# Patient Record
Sex: Male | Born: 1960 | Race: White | Hispanic: No | Marital: Married | Smoking: Never smoker
Health system: Southern US, Community
[De-identification: ages and names within clinical notes are randomized; demographics above are authoritative.]

## PROBLEM LIST (undated history)

## (undated) DIAGNOSIS — E669 Obesity, unspecified: Secondary | ICD-10-CM

## (undated) DIAGNOSIS — E119 Type 2 diabetes mellitus without complications: Secondary | ICD-10-CM

## (undated) DIAGNOSIS — M2550 Pain in unspecified joint: Secondary | ICD-10-CM

## (undated) DIAGNOSIS — I209 Angina pectoris, unspecified: Secondary | ICD-10-CM

## (undated) DIAGNOSIS — J189 Pneumonia, unspecified organism: Secondary | ICD-10-CM

## (undated) DIAGNOSIS — I10 Essential (primary) hypertension: Secondary | ICD-10-CM

## (undated) DIAGNOSIS — I2510 Atherosclerotic heart disease of native coronary artery without angina pectoris: Secondary | ICD-10-CM

## (undated) DIAGNOSIS — E785 Hyperlipidemia, unspecified: Secondary | ICD-10-CM

## (undated) DIAGNOSIS — Z8701 Personal history of pneumonia (recurrent): Secondary | ICD-10-CM

## (undated) DIAGNOSIS — E7800 Pure hypercholesterolemia, unspecified: Secondary | ICD-10-CM

## (undated) DIAGNOSIS — I639 Cerebral infarction, unspecified: Secondary | ICD-10-CM

## (undated) DIAGNOSIS — K429 Umbilical hernia without obstruction or gangrene: Secondary | ICD-10-CM

## (undated) DIAGNOSIS — E041 Nontoxic single thyroid nodule: Secondary | ICD-10-CM

## (undated) HISTORY — DX: Cerebral infarction, unspecified: I63.9

## (undated) HISTORY — PX: CORONARY ANGIOPLASTY: SHX604

## (undated) HISTORY — DX: Pain in unspecified joint: M25.50

## (undated) HISTORY — DX: Hyperlipidemia, unspecified: E78.5

## (undated) HISTORY — PX: CARDIAC CATHETERIZATION: SHX172

## (undated) HISTORY — DX: Personal history of pneumonia (recurrent): Z87.01

## (undated) HISTORY — PX: COLONOSCOPY: SHX174

## (undated) HISTORY — DX: Essential (primary) hypertension: I10

## (undated) HISTORY — DX: Umbilical hernia without obstruction or gangrene: K42.9

## (undated) HISTORY — DX: Pure hypercholesterolemia, unspecified: E78.00

## (undated) HISTORY — PX: US CAROTID DOPPLER BILATERAL (ARMC HX): HXRAD1402

## (undated) HISTORY — DX: Type 2 diabetes mellitus without complications: E11.9

## (undated) HISTORY — DX: Angina pectoris, unspecified: I20.9

## (undated) HISTORY — PX: FOOT SURGERY: SHX648

## (undated) HISTORY — PX: CARDIOVASCULAR STRESS TEST: SHX262

## (undated) HISTORY — PX: OTHER SURGICAL HISTORY: SHX169

## (undated) HISTORY — PX: TRANSTHORACIC ECHOCARDIOGRAM: SHX275

## (undated) HISTORY — DX: Obesity, unspecified: E66.9

## (undated) HISTORY — PX: APPENDECTOMY: SHX54

---

## 2002-02-12 ENCOUNTER — Encounter: Admission: RE | Admit: 2002-02-12 | Discharge: 2002-04-19 | Payer: Self-pay | Admitting: Orthopedic Surgery

## 2003-04-04 ENCOUNTER — Ambulatory Visit (HOSPITAL_BASED_OUTPATIENT_CLINIC_OR_DEPARTMENT_OTHER): Admission: RE | Admit: 2003-04-04 | Discharge: 2003-04-04 | Payer: Self-pay | Admitting: Orthopedic Surgery

## 2003-04-16 ENCOUNTER — Encounter: Admission: RE | Admit: 2003-04-16 | Discharge: 2003-07-15 | Payer: Self-pay | Admitting: Orthopedic Surgery

## 2008-10-21 ENCOUNTER — Ambulatory Visit (HOSPITAL_COMMUNITY): Admission: RE | Admit: 2008-10-21 | Discharge: 2008-10-21 | Payer: Self-pay | Admitting: Interventional Cardiology

## 2008-10-28 ENCOUNTER — Inpatient Hospital Stay (HOSPITAL_COMMUNITY): Admission: RE | Admit: 2008-10-28 | Discharge: 2008-10-29 | Payer: Self-pay | Admitting: Interventional Cardiology

## 2008-11-13 ENCOUNTER — Encounter (HOSPITAL_COMMUNITY): Admission: RE | Admit: 2008-11-13 | Discharge: 2009-02-11 | Payer: Self-pay | Admitting: Interventional Cardiology

## 2009-02-12 ENCOUNTER — Encounter (HOSPITAL_COMMUNITY): Admission: RE | Admit: 2009-02-12 | Discharge: 2009-03-16 | Payer: Self-pay | Admitting: Interventional Cardiology

## 2011-02-09 LAB — GLUCOSE, CAPILLARY: Glucose-Capillary: 99 mg/dL (ref 70–99)

## 2011-02-10 LAB — GLUCOSE, CAPILLARY: Glucose-Capillary: 94 mg/dL (ref 70–99)

## 2011-02-14 LAB — GLUCOSE, CAPILLARY
Glucose-Capillary: 104 mg/dL — ABNORMAL HIGH (ref 70–99)
Glucose-Capillary: 194 mg/dL — ABNORMAL HIGH (ref 70–99)
Glucose-Capillary: 87 mg/dL (ref 70–99)

## 2011-02-15 LAB — GLUCOSE, CAPILLARY
Glucose-Capillary: 101 mg/dL — ABNORMAL HIGH (ref 70–99)
Glucose-Capillary: 115 mg/dL — ABNORMAL HIGH (ref 70–99)
Glucose-Capillary: 127 mg/dL — ABNORMAL HIGH (ref 70–99)
Glucose-Capillary: 129 mg/dL — ABNORMAL HIGH (ref 70–99)
Glucose-Capillary: 207 mg/dL — ABNORMAL HIGH (ref 70–99)
Glucose-Capillary: 109 mg/dL — ABNORMAL HIGH (ref 70–99)
Glucose-Capillary: 116 mg/dL — ABNORMAL HIGH (ref 70–99)
Glucose-Capillary: 136 mg/dL — ABNORMAL HIGH (ref 70–99)

## 2011-03-18 NOTE — Op Note (Signed)
NAME:  Eddie Garner, Eddie Garner                         ACCOUNT NO.:  [id number]   MEDICAL RECORD NO.:  [id number]                   PATIENT TYPE:  AMB   LOCATION:  DSC                                  FACILITY:  [facility]   PHYSICIAN:  Almedia Balls. Ranell Patrick, M.D.              DATE OF BIRTH:  [birth year]   DATE OF PROCEDURE:  04/04/2003  DATE OF DISCHARGE:                                 OPERATIVE REPORT   PREOPERATIVE DIAGNOSIS:  Right knee chondromalacia.   POSTOPERATIVE DIAGNOSES:  1. Right knee chondromalacia patellofemoral joint, grade 3.  2. Right knee medial femoral chondral fracture measuring 1 cm x 1.5 cm.  3. Hypertrophic synovium.   PROCEDURE PERFORMED:  Right knee arthroscopy with debridement of  hypertrophic synovium and abrasion chondroplasty medial femoral condyle.   ATTENDING SURGEON:  Almedia Balls. Ranell Patrick, M.D.   FIRST ASSISTANT:  None.   ANESTHESIA:  Local plus MAC was used.   ESTIMATED BLOOD LOSS:  Minimal.   FLUID REPLACEMENT:  800 mL crystalloid.   INSTRUMENT COUNTS:  Correct.   COMPLICATIONS:  None.   ANTIBIOTICS:  Preoperative antibiotics were given.   INDICATIONS:  The patient is a 50 year old male, who injured his right knee  on the job.  The patient has had a long period of nonoperative treatment  consisting of activity modification, anti-inflammatory medications, and  injections.  The patient now presents for persistent medial-sided pain with  concerns over chondromalacia and possible patella maltracking for  arthroscopic surgery.  Informed consent was obtained.   DESCRIPTION OF OPERATION:  After an adequate level of anesthesia was  achieved, the patient was positioned supine on the operating room table.  A  nonsterile tourniquet was placed on the right proximal leg.  The right leg  is prepped and draped in its entirety in the usual sterile fashion.  Diagnostic knee arthroscopy was performed through standard arthroscopic  portals.  Superolateral outflow,  anterior lateral scope, and anteromedial  working portals were all created in a similar fashion with infiltration of  the skin with 0.5% Marcaine with epinephrine followed by incision with 11  blade scalpel and introduction of cannula in the joint using blunt  obturators.  Diagnostic arthroscopy revealed significant hypertrophic  synovium present in the anterior aspect of the joint.  This was all cleaned  out revealing a notch with a normal ACL and PCL, and there was noted to be  no evidence of medial meniscus tear.  The medial femoral condyle was noted  to have a fairly large chondral defect, measuring 1.5 x 1 cm with a loose  flap near the notch.  The lateral femoral condyle was normal.  Lateral tibia  condyle had some grade 3 chondral changes with some fraying and fibrillation  softening.  The lateral meniscus was normal as well.  There were no loose  bodies noted.  The patellofemoral joint was entered.  There was noted to be  grade 3 changes in the trochlea, both deep and mild grade 2 changes  proximally in the trochlear groove.  On the patella, there was noted to be  some central patellar chondromalacia indicative of wear; however, there was  no excessive lateral patella chondromalacia and certainly no _________ in  the lateral femoral condyle to suggest maltracking, and there was no  patellar tilt noted.  At this point, after debridement of the hypertrophic  synovium, a decision was made to try to smooth out the loose flap of  chondral surface on the medial femoral condyle.  This was done utilizing a  full radius resector.  I was very pleased with how this removed the slight  step-off noted at the medial femoral condyle, and hopefully this will  alleviate his pain.  We concluded the surgery with a thorough irrigation,  inspection for any loose pieces of cartilage.  None were noted.  ACL and PCL  again were noted to be intact.  The scope was concluded.  The wound was  sutured using 4-0  Monocryl and Steri-Strips applied.  The patient tolerated  the surgery well and was taken to the recovery room in stable condition.                                               Almedia Balls. Ranell Patrick, M.D.    SRN/MEDQ  D:  04/04/2003  T:  04/06/2003  Job:  [id number]

## 2011-08-05 LAB — BASIC METABOLIC PANEL
BUN: 13 mg/dL (ref 6–23)
BUN: 14 mg/dL (ref 6–23)
CO2: 26 mEq/L (ref 19–32)
Chloride: 106 mEq/L (ref 96–112)
Creatinine, Ser: 0.82 mg/dL (ref 0.4–1.5)
GFR calc Af Amer: >60 mL/min (ref >60)
GFR calc non Af Amer: >60 mL/min (ref >60)
Glucose, Bld: 114 mg/dL — ABNORMAL HIGH (ref 70–99)
Potassium: 4 mEq/L (ref 3.5–5.1)

## 2011-08-05 LAB — CBC
HCT: 41.2 % (ref 39.0–52.0)
MCHC: 33.9 g/dL (ref 30.0–36.0)
MCV: 93.2 fL (ref 78.0–100.0)
Platelets: 217 10*3/uL (ref 150–400)
Platelets: 224 10*3/uL (ref 150–400)
RBC: 4.82 MIL/uL (ref 4.22–5.81)
RDW: 12.4 % (ref 11.5–15.5)
WBC: 6.6 10*3/uL (ref 4.0–10.5)
WBC: 7.2 10*3/uL (ref 4.0–10.5)

## 2011-08-05 LAB — APTT: aPTT: 31 seconds (ref 24–37)

## 2011-08-05 LAB — PROTIME-INR
INR: 1 (ref 0.00–1.49)
Prothrombin Time: 13.5 seconds (ref 11.6–15.2)

## 2011-08-05 LAB — GLUCOSE, CAPILLARY: Glucose-Capillary: 120 mg/dL — ABNORMAL HIGH (ref 70–99)

## 2013-07-31 ENCOUNTER — Other Ambulatory Visit: Payer: Self-pay | Admitting: Interventional Cardiology

## 2013-08-01 ENCOUNTER — Other Ambulatory Visit: Payer: Self-pay | Admitting: Cardiology

## 2013-08-01 MED ORDER — SIMVASTATIN 20 MG PO TABS: 20.0000 mg | ORAL_TABLET | Freq: Every day | ORAL | Status: DC

## 2013-09-02 ENCOUNTER — Telehealth: Payer: Self-pay | Admitting: Interventional Cardiology

## 2013-09-02 NOTE — Telephone Encounter (Signed)
New problem   Pt stated he needed a stent card. Please call pt concerning this matter.

## 2013-09-03 NOTE — Telephone Encounter (Signed)
Spoke with pt and let him know to call the cath lab to obtain a copy of stent card.

## 2013-09-17 ENCOUNTER — Other Ambulatory Visit: Payer: Self-pay | Admitting: Pharmacist

## 2013-09-17 MED ORDER — SIMVASTATIN 20 MG PO TABS: 20.0000 mg | ORAL_TABLET | Freq: Every day | ORAL | Status: DC

## 2013-09-21 ENCOUNTER — Encounter: Payer: Self-pay | Admitting: Interventional Cardiology

## 2013-09-21 ENCOUNTER — Encounter: Payer: Self-pay | Admitting: *Deleted

## 2013-09-23 ENCOUNTER — Ambulatory Visit: Payer: Self-pay | Admitting: Interventional Cardiology

## 2013-09-27 ENCOUNTER — Encounter: Payer: Self-pay | Admitting: Interventional Cardiology

## 2014-01-02 ENCOUNTER — Telehealth: Payer: Self-pay | Admitting: Interventional Cardiology

## 2014-01-02 NOTE — Telephone Encounter (Signed)
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  pls call pt when note is ready

## 2014-01-02 NOTE — Telephone Encounter (Signed)
To Dr. Irish Lack, if you type what you would like letter to say, I copy/paste and make a letter for you to sign.

## 2014-01-03 ENCOUNTER — Encounter: Payer: Self-pay | Admitting: Cardiology

## 2014-01-03 NOTE — Telephone Encounter (Signed)
This letter is in regards to Eddie Garner, DOB [birth year].  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 He has not reported any cardiovascular symptoms in several years.  Sincerely,    Casandra Doffing, MD

## 2014-01-03 NOTE — Telephone Encounter (Signed)
Letter printed, awaiting signature.  

## 2014-01-06 NOTE — Telephone Encounter (Signed)
Letter signed, pt aware and he will pick up today

## 2014-04-22 ENCOUNTER — Encounter: Payer: Self-pay | Admitting: *Deleted

## 2014-07-11 ENCOUNTER — Other Ambulatory Visit: Payer: Self-pay

## 2014-07-11 MED ORDER — SIMVASTATIN 20 MG PO TABS: 20.0000 mg | ORAL_TABLET | Freq: Every day | ORAL | Status: DC

## 2014-07-11 MED ORDER — CLOPIDOGREL BISULFATE 75 MG PO TABS: 75.0000 mg | ORAL_TABLET | Freq: Every day | ORAL | Status: DC

## 2014-07-22 ENCOUNTER — Ambulatory Visit: Payer: Self-pay | Admitting: Interventional Cardiology

## 2014-08-15 ENCOUNTER — Other Ambulatory Visit: Payer: Self-pay | Admitting: Interventional Cardiology

## 2014-08-21 NOTE — Telephone Encounter (Signed)
I need to see him before refilling meds.

## 2014-08-22 ENCOUNTER — Other Ambulatory Visit: Payer: Self-pay

## 2014-09-03 ENCOUNTER — Telehealth: Payer: Self-pay | Admitting: Interventional Cardiology

## 2014-09-03 NOTE — Telephone Encounter (Signed)
Received request from Nurse fax box, documents faxed for surgical clearance. To: [facility] Latino  Fax number: [phone] Attention: 11.3.2015/L.Owens Shark

## 2014-11-10 ENCOUNTER — Encounter: Payer: Self-pay | Admitting: Interventional Cardiology

## 2014-11-10 ENCOUNTER — Encounter: Payer: Self-pay | Admitting: *Deleted

## 2014-11-10 ENCOUNTER — Ambulatory Visit (INDEPENDENT_AMBULATORY_CARE_PROVIDER_SITE_OTHER): Payer: 59 | Admitting: Interventional Cardiology

## 2014-11-10 VITALS — BP 122/88 | HR 66 | Ht 71.0 in | Wt 241.0 lb

## 2014-11-10 DIAGNOSIS — E782 Mixed hyperlipidemia: Secondary | ICD-10-CM

## 2014-11-10 DIAGNOSIS — I10 Essential (primary) hypertension: Secondary | ICD-10-CM

## 2014-11-10 DIAGNOSIS — I2510 Atherosclerotic heart disease of native coronary artery without angina pectoris: Secondary | ICD-10-CM

## 2014-11-10 NOTE — Patient Instructions (Signed)
Your physician has recommended you make the following change in your medication:  1) STOP taking Aspirin  Your physician wants you to follow-up in: 1 year with Dr. Varanasi. You will receive a reminder letter in the mail two months in advance. If you don't receive a letter, please call our office to schedule the follow-up appointment.   

## 2014-11-10 NOTE — Progress Notes (Signed)
Patient ID: Eddie Garner, male   DOB: [birth year], 54 y.o.   MRN: [id number]    [address], [address] [place], [place]  [address] Phone: [phone] Fax:  [phone]  Date:  11/10/2014   ID:  Eddie Garner, DOB Sep 26, 1961, MRN [id number]  PCP:  Abigail Miyamoto, MD      History of Present Illness: Eddie Garner is a 54 y.o. male who has had CAD and a DES to the LAD in 2009.  He has done well since that time.  He was in a study for his cholesterol.  He feels that his appetite is increased since stopping the study.   he denies any chest discomfort , dizziness, leg swelling , nitroglycerin use , irregular heartbeats , lightheadedness or passing out. He has a very physically strenuous job. He has not had any anginal symptoms at work. He thinks he has gained a little bit of weight.   He was diagnosed with diabetes. This is managed with oral medications.   Wt Readings from Last 3 Encounters:  11/10/14 241 lb (109.317 kg)     Past Medical History  Diagnosis Date  . Pain in joint   . Obesity   . HTN (hypertension)   . Angina pectoris   . Hyperlipidemia   . Hypercholesteremia   . Diabetes     Current Outpatient Prescriptions  Medication Sig Dispense Refill  . aspirin 81 MG tablet Take 81 mg by mouth daily.    . clopidogrel (PLAVIX) 75 MG tablet Take 1 tablet (75 mg total) by mouth daily with breakfast. 30 tablet 0  . glucose blood test strip 1 each by Other route as needed for other. Use as instructed    . INVOKANA 100 MG TABS tablet Take 100 mg by mouth daily.  1  . lisinopril (PRINIVIL,ZESTRIL) 10 MG tablet Take 10 mg by mouth daily.    . metFORMIN (GLUCOPHAGE-XR) 750 MG 24 hr tablet Take 750 mg by mouth daily with breakfast.    . Multiple Vitamin (MULTIVITAMIN) capsule Take 1 capsule by mouth daily.    . Omega-3 Fatty Acids (FISH OIL) 1000 MG CAPS Take 1 capsule by mouth daily.    . simvastatin (ZOCOR) 20 MG tablet Take 1 tablet (20 mg total) by mouth at bedtime. 30  tablet 0  . nitroGLYCERIN (NITROSTAT) 0.4 MG SL tablet Place 0.4 mg under the tongue every 5 (five) minutes as needed for chest pain (every 5 mins up to three times).     No current facility-administered medications for this visit.    Allergies:   No Known Allergies  Social History:  The patient  reports that he has never smoked. He does not have any smokeless tobacco history on file. He reports that he drinks alcohol.   Family History:  The patient's family history includes Atrial fibrillation in his father; CVA in his paternal grandfather; Coronary artery disease in his maternal grandfather; Diabetes in his paternal grandmother; Stroke in his paternal grandfather.   ROS:  Please see the history of present illness.  No nausea, vomiting.  No fevers, chills.  No focal weakness.  No dysuria.   All other systems reviewed and negative.   PHYSICAL EXAM: VS:  BP 122/88 mmHg  Pulse 66  Ht 5\' 11"  (1.803 m)  Wt 241 lb (109.317 kg)  BMI 33.63 kg/m2 General: Well developed, well nourished, in no acute distress HEENT: normal Neck: no JVD, no carotid bruits Cardiac:  normal S1,  S2; RRR;  Lungs:  clear to auscultation bilaterally, no wheezing, rhonchi or rales Abd: soft, nontender, no hepatomegaly Ext: no edema Skin: warm and dry Neuro:   no focal abnormalities noted Psych: normal affect  EKG:  * normal sinus rhythm , inferior T wave inversions, no significant ST segment changes    ASSESSMENT AND PLAN:  1.  coronary artery disease: continue Plavix 75 mg daily. Stop aspirin since it has been several years since his stent placement. Continue lipid-lowering therapy with LDL target less than 100. 2.  Hypertension: blood pressure well controlled today.   Continue lisinopril. Check blood pressure occasionally at home if possible. If he has readings consistently over 130/80, then would increase lisinopril. ACE inhibitor is a reasonable choice since he is now diabetic. 3.  Mixed hyperlipidemia: lipids  have been controlled on current regimen. Will obtain most recent blood work from primary care doctor. 4.  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 5.  I encouraged him to try to get 150 minutes of aerobic exercise every week.   Diabetes managed by primary care.  Signed, Mina Marble, MD, [facility] 11/10/2014 8:59 AM

## 2014-11-21 ENCOUNTER — Other Ambulatory Visit: Payer: Self-pay | Admitting: Interventional Cardiology

## 2014-12-02 ENCOUNTER — Ambulatory Visit: Payer: Self-pay | Admitting: Interventional Cardiology

## 2014-12-23 ENCOUNTER — Other Ambulatory Visit: Payer: Self-pay | Admitting: Interventional Cardiology

## 2014-12-24 ENCOUNTER — Other Ambulatory Visit: Payer: Self-pay

## 2014-12-24 MED ORDER — SIMVASTATIN 20 MG PO TABS: ORAL_TABLET | ORAL | Status: DC

## 2014-12-24 MED ORDER — CLOPIDOGREL BISULFATE 75 MG PO TABS: ORAL_TABLET | ORAL | Status: DC

## 2015-01-16 ENCOUNTER — Other Ambulatory Visit: Payer: Self-pay | Admitting: *Deleted

## 2015-01-16 MED ORDER — CLOPIDOGREL BISULFATE 75 MG PO TABS: ORAL_TABLET | ORAL | Status: DC

## 2015-01-28 ENCOUNTER — Other Ambulatory Visit: Payer: Self-pay | Admitting: Interventional Cardiology

## 2015-02-21 ENCOUNTER — Other Ambulatory Visit: Payer: Self-pay | Admitting: Interventional Cardiology

## 2015-07-29 ENCOUNTER — Ambulatory Visit: Payer: 59 | Admitting: Nurse Practitioner

## 2015-11-13 ENCOUNTER — Telehealth: Payer: Self-pay | Admitting: Interventional Cardiology

## 2015-11-13 NOTE — Telephone Encounter (Signed)
Walk in today to make his appointment to see Dr. Irish Lack on 12-07-15.  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

## 2015-11-13 NOTE — Telephone Encounter (Signed)
The pt states that he is not sure what type of stress tests he needs and wants to wait until appt with Dr Irish Lack on 2/6 to discuss as he states that he wants to have the one that is best for him. 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  Will forward to Dr Irish Lack as Juluis Rainier.

## 2015-11-13 NOTE — Telephone Encounter (Signed)
LMTCB

## 2015-12-07 ENCOUNTER — Encounter: Payer: Self-pay | Admitting: Interventional Cardiology

## 2015-12-07 ENCOUNTER — Ambulatory Visit (INDEPENDENT_AMBULATORY_CARE_PROVIDER_SITE_OTHER): Payer: 59 | Admitting: Interventional Cardiology

## 2015-12-07 VITALS — BP 136/80 | HR 83 | Ht 70.5 in | Wt 238.8 lb

## 2015-12-07 DIAGNOSIS — I10 Essential (primary) hypertension: Secondary | ICD-10-CM | POA: Diagnosis not present

## 2015-12-07 DIAGNOSIS — I2510 Atherosclerotic heart disease of native coronary artery without angina pectoris: Secondary | ICD-10-CM | POA: Diagnosis not present

## 2015-12-07 DIAGNOSIS — E782 Mixed hyperlipidemia: Secondary | ICD-10-CM | POA: Diagnosis not present

## 2015-12-07 NOTE — Patient Instructions (Addendum)
Medication Instructions:  Your physician recommends that you continue on your current medications as directed. Please refer to the Current Medication list given to you today.   Labwork: NONE ORDERED  Testing/Procedures: Your physician has requested that you have en exercise stress myoview. For further information please visit [email or web address]. Please follow instruction sheet, as given.    Follow-Up: Your physician wants you to follow-up in: [place] DR. VARANASI.  You will receive a reminder letter in the mail two months in advance. If you don't receive a letter, please call our office to schedule the follow-up appointment.   Any Other Special Instructions Will Be Listed Below (If Applicable).    If you need a refill on your cardiac medications before your next appointment, please call your pharmacy.

## 2015-12-07 NOTE — Progress Notes (Signed)
Patient ID: Eddie Garner, male   DOB: 04-Feb-1961, 55 y.o.   MRN: [id number]     Cardiology Office Note   Date:  12/07/2015   ID:  Eddie Garner, DOB [birth year], MRN [id number]  PCP:  Abigail Miyamoto, MD    No chief complaint on file. f/u CAD   Wt Readings from Last 3 Encounters:  12/07/15 238 lb 12.8 oz (108.319 kg)  11/10/14 241 lb (109.317 kg)       History of Present Illness: Eddie Garner is a 55 y.o. male  Who has had an LAD stent.  He has had some chest pain after eating.  It is not related to exertion.  He uses 3-4 x /week.  He has not used NTG.  It is very different from what he had before his stent.   Pain is better when he does not eat.   Now on Invokana for DM.  His blood sugars were a little higher after the holiday.  He was busy at work and his eating schedule was off.     ETT in 2014 was negative.  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    Past Medical History  Diagnosis Date  . Pain in joint   . Obesity   . HTN (hypertension)   . Angina pectoris ([place])   . Hyperlipidemia   . Hypercholesteremia   . Diabetes [facility])     Past Surgical History  Procedure Laterality Date  . Foot surgery    . Appendectomy       Current Outpatient Prescriptions  Medication Sig Dispense Refill  . clopidogrel (PLAVIX) 75 MG tablet TAKE 1 TABLET (75 MG TOTAL) BY MOUTH DAILY WITH BREAKFAST. 90 tablet 3  . dapagliflozin propanediol (FARXIGA) 5 MG TABS tablet Take 1 tablet by mouth daily.    Marland Kitchen glucose blood test strip 1 each by Other route as needed for other. Use as instructed    . INVOKANA 100 MG TABS tablet Take 100 mg by mouth daily.  1  . lisinopril (PRINIVIL,ZESTRIL) 10 MG tablet Take 10 mg by mouth daily.    . metFORMIN (GLUCOPHAGE-XR) 750 MG 24 hr tablet Take 750 mg by mouth daily with breakfast.    . Multiple Vitamin (MULTIVITAMIN) capsule Take 1 capsule by mouth daily.    . nitroGLYCERIN (NITROSTAT) 0.4 MG SL tablet Place 0.4 mg under the tongue every 5 (five)  minutes as needed for chest pain (every 5 mins up to three times).    . Omega-3 Fatty Acids (FISH OIL) 1000 MG CAPS Take 1 capsule by mouth daily.    . simvastatin (ZOCOR) 20 MG tablet TAKE 1 TABLET (20 MG TOTAL) BY MOUTH AT BEDTIME. 30 tablet 11   No current facility-administered medications for this visit.    Allergies:   Review of patient's allergies indicates no known allergies.    Social History:  The patient  reports that he has never smoked. He does not have any smokeless tobacco history on file. He reports that he drinks alcohol.   Family History:  The patient's family history includes Atrial fibrillation in his father; CVA in his paternal grandfather; Coronary artery disease in his maternal grandfather; Diabetes in his paternal grandmother; Stroke in his paternal grandfather.    ROS:  Please see the history of present illness.   Otherwise, review of systems are positive for mild chest pain.   All other systems are reviewed and negative.    PHYSICAL EXAM: VS:  BP 136/80  mmHg  Pulse 83  Ht 5' 10.5" (1.791 m)  Wt 238 lb 12.8 oz (108.319 kg)  BMI 33.77 kg/m2 , BMI Body mass index is 33.77 kg/(m^2). GEN: Well nourished, well developed, in no acute distress HEENT: normal Neck: no JVD, carotid bruits, or masses Cardiac: RRR; no murmurs, rubs, or gallops,no edema  Respiratory:  clear to auscultation bilaterally, normal work of breathing GI: soft, nontender, nondistended, + BS MS: no deformity or atrophy Skin: warm and dry, no rash Neuro:  Strength and sensation are intact Psych: euthymic mood, full affect   EKG:   The ekg ordered today demonstrates Normal sinus rhythm, no St segment changes   Recent Labs: No results found for requested labs within last 365 days.   Lipid Panel No results found for: CHOL, TRIG, HDL, CHOLHDL, VLDL, LDLCALC, LDLDIRECT   Other studies Reviewed: Additional studies/ records that were reviewed today with results demonstrating: stress test in  2014 negative fro ischemia.   ASSESSMENT AND PLAN:  1. CAD with chest discomfort:  spome atypical features but given 56 year old stent, will plan exercise myoview.  Continue aggressive secondary prevention including clopidogrel.  Now off aspirin. 2. DM: Managed by PMD.  COntinue meds. 3. Hyperlipidemia: COntinue simvastatin.   4. HTN: Controlled.  COntinue current meds.   Current medicines are reviewed at length with the patient today.  The patient concerns regarding his medicines were addressed.  The following changes have been made:  No change  Labs/ tests ordered today include: myoview No orders of the defined types were placed in this encounter.    Recommend 150 minutes/week of aerobic exercise Low fat, low carb, high fiber diet recommended  Disposition:   FU in for stress test   Teresita Madura., MD  12/07/2015 11:43 AM    [facility] [address], [place], [place]  [address] Phone: [phone]; Fax: [phone]

## 2015-12-09 ENCOUNTER — Telehealth (HOSPITAL_COMMUNITY): Payer: Self-pay | Admitting: *Deleted

## 2015-12-09 NOTE — Telephone Encounter (Signed)
Left message on voicemail per DPR in reference to upcoming appointment scheduled on 12/15/15 at 715 with detailed instructions given per Myocardial Perfusion Study Information Sheet for the test. LM to arrive 15 minutes early, and that it is imperative to arrive on time for appointment to keep from having the test rescheduled. If you need to cancel or reschedule your appointment, please call the office within 24 hours of your appointment. Failure to do so may result in a cancellation of your appointment, and a $50 no show fee. Phone number given for call back for any questions. Hubbard Robinson, RN

## 2015-12-15 ENCOUNTER — Ambulatory Visit (HOSPITAL_COMMUNITY): Payer: 59 | Attending: Cardiology

## 2015-12-15 DIAGNOSIS — R9439 Abnormal result of other cardiovascular function study: Secondary | ICD-10-CM | POA: Diagnosis not present

## 2015-12-15 DIAGNOSIS — E119 Type 2 diabetes mellitus without complications: Secondary | ICD-10-CM | POA: Diagnosis not present

## 2015-12-15 DIAGNOSIS — I10 Essential (primary) hypertension: Secondary | ICD-10-CM | POA: Diagnosis not present

## 2015-12-15 DIAGNOSIS — I2510 Atherosclerotic heart disease of native coronary artery without angina pectoris: Secondary | ICD-10-CM | POA: Diagnosis present

## 2015-12-15 DIAGNOSIS — R079 Chest pain, unspecified: Secondary | ICD-10-CM | POA: Diagnosis not present

## 2015-12-15 LAB — MYOCARDIAL PERFUSION IMAGING
CHL CUP NUCLEAR SRS: 0
CHL CUP RESTING HR STRESS: 67 {beats}/min
CSEPED: 11 min
CSEPEDS: 15 s
Estimated workload: 13.4 METS
LV dias vol: 120 mL
LVSYSVOL: 56 mL
MPHR: 165 {beats}/min
NUC STRESS TID: 0.92
Peak HR: 157 {beats}/min
Percent HR: 95 %
RATE: 0.33
SDS: 1
SSS: 1

## 2015-12-15 MED ORDER — TECHNETIUM TC 99M SESTAMIBI GENERIC - CARDIOLITE
32.9000 | Freq: Once | INTRAVENOUS | Status: AC | PRN
  Administered 2015-12-15: 32.9 via INTRAVENOUS

## 2015-12-15 MED ORDER — TECHNETIUM TC 99M SESTAMIBI GENERIC - CARDIOLITE
10.8000 | Freq: Once | INTRAVENOUS | Status: AC | PRN
  Administered 2015-12-15: 11 via INTRAVENOUS

## 2015-12-18 ENCOUNTER — Encounter: Payer: Self-pay | Admitting: *Deleted

## 2016-01-03 ENCOUNTER — Other Ambulatory Visit: Payer: Self-pay | Admitting: Interventional Cardiology

## 2016-01-05 NOTE — Telephone Encounter (Signed)
**Note De-identified Seidy Labreck Obfuscation** Ok to fill. Thanks

## 2016-01-05 NOTE — Telephone Encounter (Signed)
No recent lipid panel in epic, but recent office visit dictation says continue simvastatin. Ok to refill?

## 2016-01-16 ENCOUNTER — Other Ambulatory Visit: Payer: Self-pay | Admitting: Interventional Cardiology

## 2016-11-11 ENCOUNTER — Other Ambulatory Visit: Payer: Self-pay | Admitting: *Deleted

## 2016-11-11 MED ORDER — SIMVASTATIN 20 MG PO TABS: ORAL_TABLET | ORAL | 0 refills | Status: DC

## 2016-11-26 ENCOUNTER — Other Ambulatory Visit: Payer: Self-pay | Admitting: Interventional Cardiology

## 2016-11-28 NOTE — Telephone Encounter (Signed)
Medication Detail    Disp Refills Start End   simvastatin (ZOCOR) 20 MG tablet 30 tablet 0 11/11/2016    Sig: TAKE 1 TABLET (20 MG TOTAL) BY MOUTH AT BEDTIME.   E-Prescribing Status: Receipt confirmed by pharmacy (11/11/2016 4:10 PM EST)   Pharmacy   [id number] - [place], [place] - [address]

## 2017-01-05 ENCOUNTER — Ambulatory Visit: Payer: 59 | Admitting: Physician Assistant

## 2017-01-06 ENCOUNTER — Other Ambulatory Visit: Payer: Self-pay | Admitting: Interventional Cardiology

## 2017-01-06 NOTE — Telephone Encounter (Signed)
simvastatin (ZOCOR) 20 MG tablet  Medication  Date: 11/11/2016 Department: [address] Office Ordering/Authorizing: Jettie Booze, MD  Order Providers   Prescribing Provider Encounter Provider  Jettie Booze, MD Roberts Gaudy, CMA  Medication Detail    Disp Refills Start End   simvastatin (ZOCOR) 20 MG tablet 30 tablet 0 11/11/2016    Sig: TAKE 1 TABLET (20 MG TOTAL) BY MOUTH AT BEDTIME.   E-Prescribing Status: Receipt confirmed by pharmacy (11/11/2016 4:10 PM EST)   Pharmacy   [id number] - [place], [place] - [address]

## 2017-01-10 ENCOUNTER — Other Ambulatory Visit: Payer: Self-pay | Admitting: Family Medicine

## 2017-01-10 DIAGNOSIS — I739 Peripheral vascular disease, unspecified: Secondary | ICD-10-CM

## 2017-01-11 ENCOUNTER — Telehealth: Payer: Self-pay

## 2017-01-12 NOTE — Progress Notes (Signed)
Patient ID: Eddie Garner, male   DOB: 1961-03-25, 56 y.o.   MRN: [id number]     Cardiology Office Note   Date:  01/13/2017   ID:  Eddie Garner, DOB [birth year], MRN [id number]  PCP:  Abigail Miyamoto, MD    No chief complaint on file. f/u CAD   Wt Readings from Last 3 Encounters:  01/13/17 235 lb 6.4 oz (106.8 kg)  12/15/15 238 lb (108 kg)  12/07/15 238 lb 12.8 oz (108.3 kg)       History of Present Illness: Eddie Garner is a 56 y.o. male  Who has had an LAD stent in 2010.     ETT in 2014 was negative. 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    Left elbow pain at times- rare.  Has some chronic shoulder pain as well.  It is not related to exertion.  He exercises 3-4 x /week.  He has not used NTG.  It is very different from what he had before his stent.    Now on Invokana for DM.  His blood sugars were a little higher after the holiday.  He was busy at work and his eating schedule was off.  He was in [place] working for 2 months.  Last A1C 9.  Working on getting that down.        Past Medical History:  Diagnosis Date  . Angina pectoris ([place])   . Diabetes ([place])   . HTN (hypertension)   . Hypercholesteremia   . Hyperlipidemia   . Obesity   . Pain in joint     Past Surgical History:  Procedure Laterality Date  . APPENDECTOMY    . FOOT SURGERY       Current Outpatient Prescriptions  Medication Sig Dispense Refill  . clopidogrel (PLAVIX) 75 MG tablet TAKE 1 TABLET (75 MG TOTAL) BY MOUTH DAILY WITH BREAKFAST. 90 tablet 3  . dapagliflozin propanediol (FARXIGA) 5 MG TABS tablet Take 1 tablet by mouth daily.    Marland Kitchen glucose blood test strip 1 each by Other route as needed for other. Use as instructed    . INVOKANA 100 MG TABS tablet Take 100 mg by mouth daily.  1  . JANUVIA 100 MG tablet Take 100 mg by mouth daily.  1  . lisinopril (PRINIVIL,ZESTRIL) 10 MG tablet Take 10 mg by mouth daily.    . metFORMIN (GLUCOPHAGE-XR)  750 MG 24 hr tablet Take 750 mg by mouth daily with breakfast.    . Multiple Vitamin (MULTIVITAMIN) capsule Take 1 capsule by mouth daily.    . nitroGLYCERIN (NITROSTAT) 0.4 MG SL tablet Place 0.4 mg under the tongue every 5 (five) minutes as needed for chest pain (every 5 mins up to three times).    . Omega-3 Fatty Acids (FISH OIL) 1000 MG CAPS Take 1 capsule by mouth daily.    . simvastatin (ZOCOR) 20 MG tablet TAKE 1 TABLET (20 MG TOTAL) BY MOUTH AT BEDTIME. 30 tablet 0   No current facility-administered medications for this visit.     Allergies:   Patient has no known allergies.    Social History:  The patient  reports that he has never smoked. He has never used smokeless tobacco. He reports that he drinks alcohol.   Family History:  The patient's family history includes Atrial fibrillation in his father; CVA in his paternal grandfather; Coronary artery disease in his maternal grandfather; Diabetes in his  paternal grandmother; Stroke in his paternal grandfather.    ROS:  Please see the history of present illness.   Otherwise, review of systems are positive for mild arm pain.   All other systems are reviewed and negative.    PHYSICAL EXAM: VS:  BP 128/84   Pulse 79   Ht 5\' 11"  (1.803 m)   Wt 235 lb 6.4 oz (106.8 kg)   SpO2 95%   BMI 32.83 kg/m  , BMI Body mass index is 32.83 kg/m. GEN: Well nourished, well developed, in no acute distress  HEENT: normal  Neck: no JVD, carotid bruits, or masses Cardiac: RRR; no murmurs, rubs, or gallops,no edema  Respiratory:  clear to auscultation bilaterally, normal work of breathing GI: soft, nontender, nondistended, + BS MS: no deformity or atrophy  Skin: warm and dry, no rash Neuro:  Strength and sensation are intact Psych: euthymic mood, full affect   EKG:   The ekg ordered today demonstrates Normal sinus rhythm, no St segment changes   Recent Labs: No results found for requested labs within last 8760 hours.   Lipid Panel No  results found for: CHOL, TRIG, HDL, CHOLHDL, VLDL, LDLCALC, LDLDIRECT   Other studies Reviewed: Additional studies/ records that were reviewed today with results demonstrating: stress test in 2014 negative for ischemia.   ASSESSMENT AND PLAN:  1. CAD with chest discomfort:  Atypical sx.  Angina sx controlled.  Arm pain episodes do not appear to be related to angina.  Does not limit activity.  Negative nuclear stress test last year.  Continue aggressive secondary prevention including clopidogrel.  Now off aspirin. 2. DM: Managed by PMD.  COntinue meds.  A1C elevated.  Spoke about decreasing sugar intake to lower A1C 3. Hyperlipidemia: COntinue simvastatin.  Checked at PMD.  Controlled per his report.  4. HTN: Controlled.  COntinue current meds. 5. 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   Current medicines are reviewed at length with the patient today.  The patient concerns regarding his medicines were addressed.  The following changes have been made:  No change  Labs/ tests ordered today include: myoview No orders of the defined types were placed in this encounter.   Recommend 150 minutes/week of aerobic exercise Low fat, low carb, high fiber diet recommended  Disposition:   FU in 1 year   Signed, Larae Grooms, MD  01/13/2017 10:13 AM    [facility] [place], [place], [place]  [address] Phone: [phone]; Fax: [phone]

## 2017-01-12 NOTE — Telephone Encounter (Signed)
SENT NOTES TO SCHEDULING 

## 2017-01-13 ENCOUNTER — Encounter (INDEPENDENT_AMBULATORY_CARE_PROVIDER_SITE_OTHER): Payer: Self-pay

## 2017-01-13 ENCOUNTER — Encounter: Payer: Self-pay | Admitting: Interventional Cardiology

## 2017-01-13 ENCOUNTER — Ambulatory Visit (INDEPENDENT_AMBULATORY_CARE_PROVIDER_SITE_OTHER): Payer: 59 | Admitting: Interventional Cardiology

## 2017-01-13 ENCOUNTER — Encounter: Payer: Self-pay | Admitting: *Deleted

## 2017-01-13 VITALS — BP 128/84 | HR 79 | Ht 71.0 in | Wt 235.4 lb

## 2017-01-13 DIAGNOSIS — I2510 Atherosclerotic heart disease of native coronary artery without angina pectoris: Secondary | ICD-10-CM | POA: Diagnosis not present

## 2017-01-13 DIAGNOSIS — E782 Mixed hyperlipidemia: Secondary | ICD-10-CM | POA: Diagnosis not present

## 2017-01-13 DIAGNOSIS — I25118 Atherosclerotic heart disease of native coronary artery with other forms of angina pectoris: Secondary | ICD-10-CM

## 2017-01-13 DIAGNOSIS — E118 Type 2 diabetes mellitus with unspecified complications: Secondary | ICD-10-CM | POA: Diagnosis not present

## 2017-01-13 DIAGNOSIS — I10 Essential (primary) hypertension: Secondary | ICD-10-CM | POA: Diagnosis not present

## 2017-01-13 MED ORDER — SIMVASTATIN 20 MG PO TABS: ORAL_TABLET | ORAL | 3 refills | Status: DC

## 2017-01-13 MED ORDER — LISINOPRIL 10 MG PO TABS: 10.0000 mg | ORAL_TABLET | Freq: Every day | ORAL | 3 refills | Status: DC

## 2017-01-13 MED ORDER — CLOPIDOGREL BISULFATE 75 MG PO TABS: ORAL_TABLET | ORAL | 3 refills | Status: DC

## 2017-01-13 NOTE — Patient Instructions (Signed)
Medication Instructions:  Your physician recommends that you continue on your current medications as directed. Please refer to the Current Medication list given to you today.   Labwork: NONE  Testing/Procedures: NONE  Follow-Up: Your physician wants you to follow-up in: 1 YEAR WITH DR. VARANASI.  You will receive a reminder letter in the mail two months in advance. If you don't receive a letter, please call our office to schedule the follow-up appointment.   Any Other Special Instructions Will Be Listed Below (If Applicable).     If you need a refill on your cardiac medications before your next appointment, please call your pharmacy.   

## 2017-01-19 ENCOUNTER — Ambulatory Visit: Payer: 59 | Admitting: Physician Assistant

## 2017-01-24 ENCOUNTER — Other Ambulatory Visit: Payer: Self-pay | Admitting: Interventional Cardiology

## 2017-01-25 NOTE — Telephone Encounter (Signed)
Medication Detail    Disp Refills Start End   simvastatin (ZOCOR) 20 MG tablet 90 tablet 3 01/13/2017    Sig: TAKE 1 TABLET (20 MG TOTAL) BY MOUTH AT BEDTIME.   E-Prescribing Status: Receipt confirmed by pharmacy (01/13/2017 10:18 AM EDT)   Pharmacy   [id number] - [place], [place] [address]

## 2017-02-03 ENCOUNTER — Other Ambulatory Visit: Payer: Self-pay | Admitting: Interventional Cardiology

## 2018-01-04 ENCOUNTER — Encounter: Payer: Self-pay | Admitting: Interventional Cardiology

## 2018-01-05 ENCOUNTER — Other Ambulatory Visit: Payer: Self-pay | Admitting: *Deleted

## 2018-01-05 MED ORDER — SIMVASTATIN 20 MG PO TABS: ORAL_TABLET | ORAL | 0 refills | Status: DC

## 2018-01-09 NOTE — Progress Notes (Signed)
Cardiology Office Note   Date:  01/12/2018   ID:  Eddie Garner, DOB November 25, 1960, MRN [id number]  PCP:  Eddie Melter, MD    No chief complaint on file.  CAD  Wt Readings from Last 3 Encounters:  01/12/18 230 lb (104.3 kg)  01/13/17 235 lb 6.4 oz (106.8 kg)  12/15/15 238 lb (108 kg)       History of Present Illness: Eddie Garner is a 57 y.o. male  Who has had an LAD stent in 2010.     ETT in 2014 was negative. 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   Working on getting HbA1C down, was at 9, in the past.  In 11/18, it was 8.3.  He continues to try to eat healthy.    Denies : Chest pain. Dizziness. Leg edema. Nitroglycerin use. Orthopnea. Palpitations. Paroxysmal nocturnal dyspnea. Shortness of breath. Syncope.   Exercise limited by shoulder problems which may require surgery.  He has had a nosebleed with sinus infection.  Not usually.   No other bleeding issues.   BP is typically in the 125-128 range.   Past Medical History:  Diagnosis Date  . Angina pectoris ([place])   . Diabetes ([place])   . HTN (hypertension)   . Hypercholesteremia   . Hyperlipidemia   . Obesity   . Pain in joint     Past Surgical History:  Procedure Laterality Date  . APPENDECTOMY    . FOOT SURGERY       Current Outpatient Medications  Medication Sig Dispense Refill  . clopidogrel (PLAVIX) 75 MG tablet TAKE 1 TABLET (75 MG TOTAL) BY MOUTH DAILY WITH BREAKFAST. 90 tablet 3  . dapagliflozin propanediol (FARXIGA) 5 MG TABS tablet Take 1 tablet by mouth daily.    Marland Kitchen glucose blood test strip 1 each by Other route as needed for other. Use as instructed    . INVOKANA 100 MG TABS tablet Take 100 mg by mouth daily.  1  . JANUVIA 100 MG tablet Take 100 mg by mouth daily.  1  . lisinopril (PRINIVIL,ZESTRIL) 10 MG tablet Take 1 tablet (10 mg total) by mouth daily. 90 tablet 3  . metFORMIN (GLUCOPHAGE-XR) 750 MG 24 hr tablet Take 750 mg by mouth  daily with breakfast.    . Multiple Vitamin (MULTIVITAMIN) capsule Take 1 capsule by mouth daily.    . nitroGLYCERIN (NITROSTAT) 0.4 MG SL tablet Place 0.4 mg under the tongue every 5 (five) minutes as needed for chest pain (every 5 mins up to three times).    . Omega-3 Fatty Acids (FISH OIL) 1000 MG CAPS Take 1 capsule by mouth daily.    . simvastatin (ZOCOR) 20 MG tablet TAKE 1 TABLET (20 MG TOTAL) BY MOUTH AT BEDTIME. 90 tablet 0   No current facility-administered medications for this visit.     Allergies:   Patient has no known allergies.    Social History:  The patient  reports that  has never smoked. he has never used smokeless tobacco. He reports that he drinks alcohol.   Family History:  The patient's family history includes Atrial fibrillation in his father; CVA in his paternal grandfather; Coronary artery disease in his maternal grandfather; Diabetes in his paternal grandmother; Stroke in his paternal grandfather.    ROS:  Please see the history of present illness.   Otherwise, review of systems are positive for rare nosebleed.   All other systems are  reviewed and negative.    PHYSICAL EXAM: VS:  BP 116/80   Pulse 68   Ht 5\' 11"  (1.803 m)   Wt 230 lb (104.3 kg)   BMI 32.08 kg/m  , BMI Body mass index is 32.08 kg/m. GEN: Well nourished, well developed, in no acute distress  HEENT: normal  Neck: no JVD, carotid bruits, ; right base of neck is larger than left- being evaluated by PMD Cardiac: RRR; no murmurs, rubs, or gallops,no edema  Respiratory:  clear to auscultation bilaterally, normal work of breathing GI: soft, nontender, nondistended, + BS MS: no deformity or atrophy  Skin: warm and dry, no rash Neuro:  Strength and sensation are intact Psych: euthymic mood, full affect   EKG:   The ekg ordered today demonstrates NSR, no ST changes   Recent Labs: No results found for requested labs within last 8760 hours.   Lipid Panel No results found for: CHOL, TRIG,  HDL, CHOLHDL, VLDL, LDLCALC, LDLDIRECT   Other studies Reviewed: Additional studies/ records that were reviewed today with results demonstrating: Labs reviewed; 2017 nuclear stress test negative.   ASSESSMENT AND PLAN:  1. CAD: No angina on medical therapy. Continue aggressive secondary prevention. 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  Hopefully, he will be able to increase his exercise once his shoulder issue is resolved. 2. DM: Continue regular exercise and healthy diet.  He states A1c has been improving.  Managed with his primary care doctor. 3. Hyperlipidemia: LDL 77.  The current medical regimen is effective;  continue present plan and medications. 4. HTN: Well-controlled at home.  The current medical regimen is effective;  continue present plan and medications.   Current medicines are reviewed at length with the patient today.  The patient concerns regarding his medicines were addressed.  The following changes have been made:  No change  Labs/ tests ordered today include: ETT No orders of the defined types were placed in this encounter.   Recommend 150 minutes/week of aerobic exercise Low fat, low carb, high fiber diet recommended  Disposition:   FU for ETT and 1 year   Signed, Larae Grooms, MD  01/12/2018 8:16 AM    [facility] [address], [place], [place]  [address] Phone: [phone]; Fax: [phone]

## 2018-01-12 ENCOUNTER — Ambulatory Visit (INDEPENDENT_AMBULATORY_CARE_PROVIDER_SITE_OTHER): Payer: 59 | Admitting: Interventional Cardiology

## 2018-01-12 ENCOUNTER — Encounter: Payer: Self-pay | Admitting: Interventional Cardiology

## 2018-01-12 ENCOUNTER — Encounter (INDEPENDENT_AMBULATORY_CARE_PROVIDER_SITE_OTHER): Payer: Self-pay

## 2018-01-12 VITALS — BP 116/80 | HR 68 | Ht 71.0 in | Wt 230.0 lb

## 2018-01-12 DIAGNOSIS — I10 Essential (primary) hypertension: Secondary | ICD-10-CM | POA: Diagnosis not present

## 2018-01-12 DIAGNOSIS — E782 Mixed hyperlipidemia: Secondary | ICD-10-CM

## 2018-01-12 DIAGNOSIS — E118 Type 2 diabetes mellitus with unspecified complications: Secondary | ICD-10-CM

## 2018-01-12 DIAGNOSIS — I25118 Atherosclerotic heart disease of native coronary artery with other forms of angina pectoris: Secondary | ICD-10-CM

## 2018-01-12 NOTE — Patient Instructions (Signed)
Medication Instructions:  Your physician recommends that you continue on your current medications as directed. Please refer to the Current Medication list given to you today.   Labwork: None ordered  Testing/Procedures: Your physician has requested that you have an exercise tolerance test. For further information please visit [email or web address]. Please also follow instruction sheet, as given.  Follow-Up: Your physician wants you to follow-up in: 1 year with Dr. Irish Lack. You will receive a reminder letter in the mail two months in advance. If you don't receive a letter, please call our office to schedule the follow-up appointment.   Any Other Special Instructions Will Be Listed Below (If Applicable).     If you need a refill on your cardiac medications before your next appointment, please call your pharmacy.

## 2018-01-19 ENCOUNTER — Ambulatory Visit (INDEPENDENT_AMBULATORY_CARE_PROVIDER_SITE_OTHER): Payer: 59

## 2018-01-19 DIAGNOSIS — I25118 Atherosclerotic heart disease of native coronary artery with other forms of angina pectoris: Secondary | ICD-10-CM | POA: Diagnosis not present

## 2018-01-19 LAB — EXERCISE TOLERANCE TEST
CHL CUP RESTING HR STRESS: 75 {beats}/min
CSEPED: 11 min
Estimated workload: 13.4 METS
Exercise duration (sec): 0 s
MPHR: 163 {beats}/min
Peak HR: 160 {beats}/min
Percent HR: 98 %
RPE: 18

## 2018-02-09 ENCOUNTER — Telehealth: Payer: Self-pay

## 2018-02-09 ENCOUNTER — Telehealth: Payer: Self-pay | Admitting: Interventional Cardiology

## 2018-02-09 NOTE — Telephone Encounter (Signed)
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 Patient verbalized understanding and thanked me for the call.

## 2018-02-09 NOTE — Telephone Encounter (Signed)
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 He said that it could be mailed to him or he could pick it up.

## 2018-02-09 NOTE — Telephone Encounter (Signed)
   [facility] Pre-operative Risk Assessment    Request for surgical clearance:  1. What type of surgery is being performed? Right shoulder: Right shoulder scope, EUA, MUA, capsular release, mini open RCR, bi-tenodesis   2. When is this surgery scheduled? Pending   3. What type of clearance is required (medical clearance vs. Pharmacy clearance to hold med vs. Both)? Both  4. Are there any medications that need to be held prior to surgery and how long? PLAVIX, not listed    5. Practice name and name of physician performing surgery? [facility], Dr. Veverly Fells   6. What is your office phone number? [phone]    7.   What is your office fax number? [phone] attn Santiago Bur  8.   Anesthesia type (None, local, MAC, general) ? Not Listed    Eddie Garner 02/09/2018, 3:48 PM  _________________________________________________________________   (provider comments below)

## 2018-02-12 NOTE — Telephone Encounter (Signed)
Dr. Irish Lack, this pt has remote hx of stent in 2010 and recent normal ETT. He needs shoulder surgery and they have requested to hold plavix for the procedure. Is it OK with you to hold plavix and for how long?  Please route your response back [facility] PREOP

## 2018-02-13 ENCOUNTER — Telehealth: Payer: Self-pay

## 2018-02-13 NOTE — Telephone Encounter (Signed)
Clearance faxed via Epic and faxed machine

## 2018-02-13 NOTE — Telephone Encounter (Signed)
   [facility] Pre-operative Risk Assessment    Request for surgical clearance:  1. What type of surgery is being performed? Right Shoulder, Right Shoulder scope, EUA, MUA, capsular release        Mini open RCR, bi-tendonosis 2. When is this surgery scheduled? TBD  3. What type of clearance is required (medical clearance vs. Pharmacy clearance to hold med vs. Both)? Both  4. Are there any medications that need to be held prior to surgery and how long?Pt is taking Plavix   5. Practice name and name of physician performing surgery? [facility]) Dr Dr Veverly Fells   6. What is your office phone number [phone]   7.   What is your office fax number [phone]  8.   Anesthesia type (None, local, MAC, general) ? Unknown message left requesting call back   Shella Maxim [facility] 02/13/2018, 3:48 PM  _________________________________________________________________   (provider comments below)

## 2018-02-13 NOTE — Telephone Encounter (Signed)
OK to hold Plavix 5 days prior to surgery. No further cardiac testing needed before surgery.

## 2018-02-14 NOTE — Telephone Encounter (Signed)
   Primary Cardiologist: Larae Grooms, MD  Chart reviewed as part of pre-operative protocol coverage.   The patient was already cleared for surgery on 02/09/18 by Dr. Irish Lack "OK to hold Plavix 5 days prior to surgery. No further cardiac testing needed before surgery".  I will route this recommendation to the requesting party via Epic fax function and remove from pre-op pool.  Please call with questions.  [place], [place] 02/14/2018, 2:39 PM

## 2018-03-05 HISTORY — PX: SHOULDER SURGERY: SHX246

## 2018-04-02 ENCOUNTER — Other Ambulatory Visit: Payer: Self-pay | Admitting: Interventional Cardiology

## 2018-04-07 ENCOUNTER — Other Ambulatory Visit: Payer: Self-pay | Admitting: Interventional Cardiology

## 2018-07-03 ENCOUNTER — Telehealth: Payer: Self-pay

## 2018-07-03 NOTE — Telephone Encounter (Signed)
   [facility] Pre-operative Risk Assessment    Request for surgical clearance:  1. What type of surgery is being performed?  Left elbow ulnar nerve release/Left Hand Carpel Tunnel Release   2. When is this surgery scheduled?  07/09/18   3. What type of clearance is required (medical clearance vs. Pharmacy clearance to hold med vs. Both)?  Both  4. Are there any medications that need to be held prior to surgery and how long? Plavix   5. Practice name and name of physician performing surgery? [facility]/ Dr Caralyn Guile   6. What is your office phone number [phone]    7.   What is your office fax number [phone]  8.   Anesthesia type (None, local, MAC, general) ?  general   Eddie Garner 07/03/2018, 2:18 PM  _________________________________________________________________   (provider comments below)

## 2018-07-04 NOTE — Telephone Encounter (Signed)
The office requested the most recent echo, EKG and office note

## 2018-07-04 NOTE — Telephone Encounter (Signed)
Left voice mail to call between pre op clearance. Covering staff please send requested documents.

## 2018-07-04 NOTE — Telephone Encounter (Signed)
Left Eddie Garner, Surgery Scheduler for [facility], a message clarifying if they was requesting records on pt. Left her a detailed message that she would need to send a release of information over to our medical records dept.

## 2018-07-06 NOTE — Telephone Encounter (Signed)
Able to reach patient today. He has been holding his plavix since yesterday. He is doing well on cardiac stand point since last seen by Dr. Irish Lack 12/2017.   Given past medical history and time since last visit, based on ACC/AHA guidelines, Eddie Garner would be at acceptable risk for the planned procedure without further cardiovascular testing. Advised continue to hold Plavix.   I will route this recommendation to the requesting party via Epic fax function and remove from pre-op pool.  Please call with questions.  [place], [place] 07/06/2018, 9:41 AM

## 2018-07-09 HISTORY — PX: ELBOW SURGERY: SHX618

## 2018-07-09 HISTORY — PX: HAND SURGERY: SHX662

## 2018-07-27 ENCOUNTER — Other Ambulatory Visit: Payer: Self-pay | Admitting: Physician Assistant

## 2018-07-27 DIAGNOSIS — R221 Localized swelling, mass and lump, neck: Secondary | ICD-10-CM

## 2018-07-30 ENCOUNTER — Other Ambulatory Visit: Payer: Self-pay | Admitting: Physician Assistant

## 2018-07-30 ENCOUNTER — Ambulatory Visit
Admission: RE | Admit: 2018-07-30 | Discharge: 2018-07-30 | Disposition: A | Discharge status: Home / Self Care | Payer: 59 | Source: Ambulatory Visit | Attending: Physician Assistant | Admitting: Physician Assistant

## 2018-07-30 DIAGNOSIS — R221 Localized swelling, mass and lump, neck: Secondary | ICD-10-CM

## 2018-08-15 ENCOUNTER — Other Ambulatory Visit: Payer: Self-pay | Admitting: Physician Assistant

## 2018-08-15 DIAGNOSIS — E041 Nontoxic single thyroid nodule: Secondary | ICD-10-CM

## 2018-09-05 ENCOUNTER — Ambulatory Visit
Admission: RE | Admit: 2018-09-05 | Discharge: 2018-09-05 | Disposition: A | Discharge status: Home / Self Care | Payer: 59 | Source: Ambulatory Visit | Attending: Physician Assistant | Admitting: Physician Assistant

## 2018-09-05 ENCOUNTER — Other Ambulatory Visit (HOSPITAL_COMMUNITY)
Admission: RE | Admit: 2018-09-05 | Discharge: 2018-09-05 | Disposition: A | Discharge status: Home / Self Care | Payer: 59 | Source: Ambulatory Visit | Attending: Radiology | Admitting: Radiology

## 2018-09-05 DIAGNOSIS — E041 Nontoxic single thyroid nodule: Secondary | ICD-10-CM | POA: Insufficient documentation

## 2018-10-12 ENCOUNTER — Ambulatory Visit: Payer: Self-pay | Admitting: General Surgery

## 2018-10-12 ENCOUNTER — Telehealth: Payer: Self-pay | Admitting: *Deleted

## 2018-10-12 NOTE — Telephone Encounter (Signed)
   [facility] Pre-operative Risk Assessment    Request for surgical clearance:  1. What type of surgery is being performed? Right Thyroidectomy  2. When is this surgery scheduled? Not scheduled. Needs written clearance in order to get a surgery date   3. What type of clearance is required (medical clearance vs. Pharmacy clearance to hold med vs. Both)? Medical  4. Are there any medications that need to be held prior to surgery and how long? Plavix --Need instructions from cardiology as to how the patient should hold preoperatively  5. Practice name and name of physician performing surgery? [facility]. Dr. Rosendo Gros   6. What is your office phone number [phone]    7.   What is your office fax number [phone]. Please fax note of cardiac clearance to [phone] Attn: Lindwood Coke, RN  8.   Anesthesia type (None, local, MAC, general) ? general   ______________________________________________   (provider comments below)

## 2018-10-12 NOTE — H&P (Signed)
  History of Present Illness Ralene Ok MD; 10/12/2018 11:32 AM) The patient is a 57 year old male who presents with a thyroid nodule. Referred by: Mat Carne, PA-C Chief Complaint: Right thyroid nodule  Patient is a 57 year old male with a history of CAD, diabetes, comes in with a two-day history of a right thyroid nodule. Patient recently underwent ultrasound which revealed a large 4 cm nodules on the right side as well as a left 1 cm nodule. 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 I did review the pathology as well as ultrasound personally. He was then referred for further evaluation and management.  Patient recently underwent right rotator cuff surgery as well as left elbow and wrist surgery and was off his Plavix prior to. Patient sees Dr. Irish Lack cardiologist.    Past Surgical History Nance Pew, [place]; 10/12/2018 11:01 AM) Shoulder Surgery  Right.  Allergies [facility], CMA; 10/12/2018 11:02 AM) No Known Drug Allergies [10/12/2018]: Allergies Reconciled   Medication History Nance Pew, CMA; 10/12/2018 11:04 AM) Clopidogrel Bisulfate (75MG  Tablet, Oral) Active. Farxiga (10MG  Tablet, Oral) Active. Simvastatin (20MG  Tablet, Oral) Active. Lisinopril (10MG  Tablet, Oral) Active. metFORMIN HCl ER (750MG  Tablet ER 24HR, Oral) Active. Januvia (100MG  Tablet, Oral) Active. Medications Reconciled  Social History Nance Pew, CMA; 10/12/2018 11:01 AM) Alcohol use  Occasional alcohol use. No caffeine use   Family History Nance Pew, CMA; 10/12/2018 11:01 AM) Diabetes Mellitus  Father.  Other Problems Nance Pew, CMA; 10/12/2018 11:01 AM) Diabetes Mellitus     Review of Systems Ralene Ok MD; 10/12/2018 11:31 AM) Male Genitourinary Not Present- Blood in Urine, Change in Urinary Stream, Frequency, Impotence, Nocturia, Painful Urination, Urgency  and Urine Leakage. Neurological Not Present- Decreased Memory, Fainting, Headaches, Numbness, Seizures, Tingling, Tremor, Trouble walking and Weakness. All other systems negative  Vitals (Sabrina Canty CMA; 10/12/2018 11:05 AM) 10/12/2018 11:04 AM Weight: 244.25 lb Height: 72in Body Surface Area: 2.32 m Body Mass Index: 33.13 kg/m  Temp.: 97.23F  Pulse: 81 (Regular)  P.OX: 96% (Room air) BP: 158/98 (Sitting, Left Arm, Standard)       Physical Exam Ralene Ok MD; 10/12/2018 11:34 AM) The physical exam findings are as follows: Note:Constitutional: No acute distress, conversant, appears stated age  Eyes: Anicteric sclerae, moist conjunctiva, no lid lag  Neck: No thyromegaly, trachea midline, no cervical lymphadenopathy, right thyroid nodule, large  Lungs: Clear to auscultation biilaterally, normal respiratory effot  Cardiovascular: regular rate & rhythm, no murmurs, no peripheal edema, pedal pulses 2+  GI: Soft, no masses or hepatosplenomegaly, non-tender to palpation  MSK: Normal gait, no clubbing cyanosis, edema  Skin: No rashes, palpation reveals normal skin turgor  Psychiatric: Appropriate judgment and insight, oriented to person, place, and time    Assessment & Plan Ralene Ok MD; 10/12/2018 11:33 AM) THYROID NODULE (E04.1) Impression: 57 year old male with a right thyroid nodule, 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  We will obtain cardiac clearance and the okay by his cardiologist to be off his Plavix.  1. The patient would like to proceed to the operating room for right thyroidectomy 2. All risks and benefits were discussed with the patient to generally include: infection, bleeding, damage to the recurrent laryngeal nerve, damage to parathyroid glands, and possible need for further surgery. Alternatives were offered and described. All questions were answered and the patient voiced understanding of the procedure and wishes to  proceed.

## 2018-10-17 NOTE — Telephone Encounter (Signed)
   Primary Cardiologist: Larae Grooms, MD  Chart reviewed as part of pre-operative protocol coverage. Patient was contacted 10/17/2018 in reference to pre-operative risk assessment for pending surgery as outlined below.  Eddie Garner was last seen 12/2017 by Dr. Irish Lack. H/o CAD s/p PCI 2010, DM, HN, HLD, EF 53% in 12/2015. No recent labs on file for review. Normal ETT 12/2017. I spoke with patient who affirms he is doing great without any new anginal symptoms, still working out regularly. Therefore, based on ACC/AHA guidelines, the patient would be at acceptable risk for the planned procedure without further cardiovascular testing.   Regarding holding Plavix, we typically defer the duration request to the surgeon. In the past for other procedures Dr. Irish Lack has cleared the patient to hold Plavix for 5 days. We recommend to resume when felt safe.  I will route this recommendation to the requesting party via Epic fax function and remove from pre-op pool.  Please call with questions.  Charlie Pitter, PA-C 10/17/2018, 4:57 PM

## 2018-11-09 NOTE — Pre-Procedure Instructions (Signed)
ALEKSEY NEWBERN  11/09/2018      [id number] - [place], [place] - [address] [place] [address] Phone: [phone] Fax: [phone]    Your procedure is scheduled on January 20th.  Report to [facility] Admitting at 9:00 A.M.  Call this number if you have problems the morning of surgery:  [phone]   Remember:  Do not eat or drink after midnight.     Take these medicines the morning of surgery with A SIP OF WATER   Nitroglycerin - if needed   Follow your surgeon's instructions on when to stop Plavix.  If no instructions were given by your surgeon then you will need to call the office to get those instructions.    7 days prior to surgery STOP taking any Aspirin (unless otherwise instructed by your surgeon), Aleve, Naproxen, Ibuprofen, Motrin, Advil, Goody's, BC's, all herbal medications, fish oil, and all vitamins.   WHAT DO I DO ABOUT MY DIABETES MEDICATION?   Marland Kitchen Do not take oral diabetes medicines (pills) the morning of surgery -  - Farxiga, Invokana, Metformin, Januvia  . THE NIGHT BEFORE SURGERY, DO NOT take Farxiga, Invokana       How to Manage Your Diabetes Before and After Surgery  Why is it important to control my blood sugar before and after surgery? . Improving blood sugar levels before and after surgery helps healing and can limit problems. . A way of improving blood sugar control is eating a healthy diet by: o  Eating less sugar and carbohydrates o  Increasing activity/exercise o  Talking with your doctor about reaching your blood sugar goals . High blood sugars (greater than 180 mg/dL) can raise your risk of infections and slow your recovery, so you will need to focus on controlling your diabetes during the weeks before surgery. . Make sure that the doctor who takes care of your diabetes knows about your planned surgery including the date and location.  How do I manage my blood sugar  before surgery? . Check your blood sugar at least 4 times a day, starting 2 days before surgery, to make sure that the level is not too high or low. o Check your blood sugar the morning of your surgery when you wake up and every 2 hours until you get to the Short Stay unit. . If your blood sugar is less than 70 mg/dL, you will need to treat for low blood sugar: o Do not take insulin. o Treat a low blood sugar (less than 70 mg/dL) with  cup of clear juice (cranberry or apple), 4 glucose tablets, OR glucose gel. o Recheck blood sugar in 15 minutes after treatment (to make sure it is greater than 70 mg/dL). If your blood sugar is not greater than 70 mg/dL on recheck, call [phone] for further instructions. . Report your blood sugar to the short stay nurse when you get to Short Stay.  . If you are admitted to the hospital after surgery: o Your blood sugar will be checked by the staff and you will probably be given insulin after surgery (instead of oral diabetes medicines) to make sure you have good blood sugar levels. o The goal for blood sugar control after surgery is 80-180 mg/dL.      Do not wear jewelry.  Do not wear lotions, powders, colognes, or deodorant.  Do not shave 48 hours prior to surgery.  Men may  shave face and neck.  Do not bring valuables to the hospital.  [facility] is not responsible for any belongings or valuables.   [place]- Preparing For Surgery  Before surgery, you can play an important role. Because skin is not sterile, your skin needs to be as free of germs as possible. You can reduce the number of germs on your skin by washing with CHG (chlorahexidine gluconate) Soap before surgery.  CHG is an antiseptic cleaner which kills germs and bonds with the skin to continue killing germs even after washing.    Oral Hygiene is also important to reduce your risk of infection.  Remember - BRUSH YOUR TEETH THE MORNING OF SURGERY WITH YOUR REGULAR TOOTHPASTE  Please do  not use if you have an allergy to CHG or antibacterial soaps. If your skin becomes reddened/irritated stop using the CHG.  Do not shave (including legs and underarms) for at least 48 hours prior to first CHG shower. It is OK to shave your face.  Please follow these instructions carefully.   1. Shower the NIGHT BEFORE SURGERY and the MORNING OF SURGERY with CHG.   2. If you chose to wash your hair, wash your hair first as usual with your normal shampoo.  3. After you shampoo, rinse your hair and body thoroughly to remove the shampoo.  4. Use CHG as you would any other liquid soap. You can apply CHG directly to the skin and wash gently with a scrungie or a clean washcloth.   5. Apply the CHG Soap to your body ONLY FROM THE NECK DOWN.  Do not use on open wounds or open sores. Avoid contact with your eyes, ears, mouth and genitals (private parts). Wash Face and genitals (private parts)  with your normal soap.  6. Wash thoroughly, paying special attention to the area where your surgery will be performed.  7. Thoroughly rinse your body with warm water from the neck down.  8. DO NOT shower/wash with your normal soap after using and rinsing off the CHG Soap.  9. Pat yourself dry with a CLEAN TOWEL.  10. Wear CLEAN PAJAMAS to bed the night before surgery, wear comfortable clothes the morning of surgery  11. Place CLEAN SHEETS on your bed the night of your first shower and DO NOT SLEEP WITH PETS.   Day of Surgery:  Do not apply any deodorants/lotions.  Please wear clean clothes to the hospital/surgery center.   Remember to brush your teeth WITH YOUR REGULAR TOOTHPASTE.   Contacts, dentures or bridgework may not be worn into surgery.  Leave your suitcase in the car.  After surgery it may be brought to your room.  For patients admitted to the hospital, discharge time will be determined by your treatment team.  Patients discharged the day of surgery will not be allowed to drive home.

## 2018-11-12 ENCOUNTER — Other Ambulatory Visit: Payer: Self-pay

## 2018-11-12 ENCOUNTER — Encounter (HOSPITAL_COMMUNITY): Payer: Self-pay

## 2018-11-12 ENCOUNTER — Encounter (HOSPITAL_COMMUNITY)
Admission: RE | Admit: 2018-11-12 | Discharge: 2018-11-12 | Disposition: A | Discharge status: Home / Self Care | Payer: 59 | Source: Ambulatory Visit | Attending: General Surgery | Admitting: General Surgery

## 2018-11-12 ENCOUNTER — Ambulatory Visit (HOSPITAL_COMMUNITY)
Admission: RE | Admit: 2018-11-12 | Discharge: 2018-11-12 | Disposition: A | Discharge status: Home / Self Care | Payer: 59 | Source: Ambulatory Visit | Attending: Anesthesiology | Admitting: Anesthesiology

## 2018-11-12 DIAGNOSIS — Z01818 Encounter for other preprocedural examination: Secondary | ICD-10-CM | POA: Insufficient documentation

## 2018-11-12 HISTORY — DX: Atherosclerotic heart disease of native coronary artery without angina pectoris: I25.10

## 2018-11-12 LAB — BASIC METABOLIC PANEL
Anion gap: 11 (ref 5–15)
BUN: 18 mg/dL (ref 6–20)
CO2: 21 mmol/L — AB (ref 22–32)
CREATININE: 1.01 mg/dL (ref 0.61–1.24)
Calcium: 9.4 mg/dL (ref 8.9–10.3)
Chloride: 105 mmol/L (ref 98–111)
GFR calc non Af Amer: >60 mL/min (ref >60)
Glucose, Bld: 368 mg/dL — ABNORMAL HIGH (ref 70–99)
Potassium: 4.4 mmol/L (ref 3.5–5.1)
Sodium: 137 mmol/L (ref 135–145)

## 2018-11-12 LAB — CBC
HCT: 43.9 % (ref 39.0–52.0)
Hemoglobin: 14.4 g/dL (ref 13.0–17.0)
MCH: 29.6 pg (ref 26.0–34.0)
MCHC: 32.8 g/dL (ref 30.0–36.0)
MCV: 90.1 fL (ref 80.0–100.0)
NRBC: 0 % (ref 0.0–0.2)
PLATELETS: 275 10*3/uL (ref 150–400)
RBC: 4.87 MIL/uL (ref 4.22–5.81)
RDW: 12.3 % (ref 11.5–15.5)
WBC: 7.7 10*3/uL (ref 4.0–10.5)

## 2018-11-12 LAB — GLUCOSE, CAPILLARY: GLUCOSE-CAPILLARY: 372 mg/dL — AB (ref 70–99)

## 2018-11-12 LAB — HEMOGLOBIN A1C
HEMOGLOBIN A1C: 9.4 % — AB (ref 4.8–5.6)
Mean Plasma Glucose: 223.08 mg/dL

## 2018-11-12 NOTE — Progress Notes (Signed)
PCP - Dr. Marrion Coy [facility] Cardiologist - Dr. Irish Lack   Chest x-ray - 11/12/18-verified with Ebony Hail, [place] that this was needed for this procedure.  EKG - 01/12/18 Stress Test - 01/19/18 ECHO - denies Cardiac Cath - 10/28/08  Sleep Study - denies  Fasting Blood Sugar - 180-190 Checks Blood Sugar 2 times a week.  CBG at PAT: 372; had [place] for breakfast.  Pt states only taking Metformin and Farxiga for diabetes. Has been off of [place] for about a month. States that PCP has not re-ordered med. Pt states he will call today to follow-up. Ebony Hail, [place] notified.   Blood Thinner Instructions:Plavix, per pt, he is to hold plavix for 5 days prior to surgery.   Anesthesia review: Yes  Patient denies shortness of breath, fever, cough and chest pain at PAT appointment   Patient verbalized understanding of instructions that were given to them at the PAT appointment. Patient was also instructed that they will need to review over the PAT instructions again at home before surgery.

## 2018-11-13 ENCOUNTER — Encounter (HOSPITAL_COMMUNITY): Payer: Self-pay

## 2018-11-13 NOTE — Progress Notes (Addendum)
Anesthesia Chart Review:  Case:  [id number] Date/Time:  11/19/18 1045   Procedure:  RIGHT THYROID LOBECTOMY (Right )   Anesthesia type:  General   Pre-op diagnosis:  Right thyroid nodule   Location:  [facility] OR ROOM 09 / [facility]   Surgeon:  Ralene Ok, MD      DISCUSSION: Patient is a 58 year old male scheduled for the above procedure.   History includes CAD (s/p DES LAD 2009), DM2, never smoker, HTN, HLD. He denied chest pain. He had a normal ETT within the past year.   Pre-operative cardiology assessment on 10/17/18 per Melina Copa, PA-C: "...H/o CAD s/p PCI 2010, DM, HN, HLD, EF 53% in 12/2015. No recent labs on file for review. Normal ETT 12/2017. I spoke with patient who affirms he is doing great without any new anginal symptoms, still working out regularly. Therefore, based on ACC/AHA guidelines, the patient would be at acceptable risk for the planned procedure without further cardiovascular testing.   Regarding holding Plavix, we typically defer the duration request to the surgeon. In the past for other procedures Dr. Irish Lack has cleared the patient to hold Plavix for 5 days. We recommend to resume when felt safe."   Preoperative labs showed a normal CBC and Creatinine, but poorly controlled diabetes. His CBG was 372 (after eating at [place]). Reports fasting CBGs ~ 180-190. He in on metformin and [place] currently, but has been off of [place] for about a month now. Unclear if this is due to medication changes (Farxiga instead of Invokana) or if prescription intentionally or unintentionally ran out. Patient was to follow-up with his PCP (was going to going on 11/12/18). I will follow-up with his PCP office for update and forward his lab results. I will also route A1c results to Dr. Rosendo Gros.   ADDENDUM 11/14/18 11:06 AM: Dr. Rosendo Gros is aware of A1c results. He will plan to proceed if patient without significant hyperglycemia on the day of surgery. I have contacted [facility]. Patient sees Mat Carne, [place]. I have sent her a copy of his labs with notifying her that patient was uncertain why he was not on [place] and had been instructed to contact their office.     VS: BP 129/82   Pulse 87   Temp 36.8 C   Ht 5\' 11"  (1.803 m)   Wt 108 kg   SpO2 97%   BMI 33.21 kg/m    PROVIDERS: Orpah Melter, MD is PCP ([place]), but is primary seen by an Dentist."  - Larae Grooms, MD is cardiologist. Last visit 01/12/18.    LABS: Preoperative labs noted. A1c 9.4, up from a reported "8.1" in September 2019.  (all labs ordered are listed, but only abnormal results are displayed)  Labs Reviewed  GLUCOSE, CAPILLARY - Abnormal; Notable for the following components:      Result Value   Glucose-Capillary 372 (*)    All other components within normal limits  HEMOGLOBIN A1C - Abnormal; Notable for the following components:   Hgb A1c MFr Bld 9.4 (*)    All other components within normal limits  BASIC METABOLIC PANEL - Abnormal; Notable for the following components:   CO2 21 (*)    Glucose, Bld 368 (*)    All other components within normal limits  CBC    IMAGES: CXR 11/12/18: IMPRESSION: No active cardiopulmonary disease.   EKG: 01/12/18: NSR   CV: ETT 01/19/18:  Blood pressure  demonstrated a hypertensive response to exercise.  There was no ST segment deviation noted during stress. Normal ETT HTN response to exercise  Cardiac cath 10/28/08 [facility], scanned under Media tab): Findings: The left main is widely patent.  The left circumflex was a large vessel with mild irregularities.  The OM 3 was a large branching vessel with mild irregularities.  The OM1 and OM 2 were small vessels which were widely patent.  The left anterior descending was a large vessel which reaches the apex.  There was mild to moderate diffuse atherosclerosis in the distal vessel.  At the origin of the first diagonal there was a 95%  lesion in the LAD.  There were mild luminal irregularities in the mid vessel.  The D2 was a small vessel and widely patent the D3 was a small vessel.  The right coronary artery was a medium size dominant vessel with mild diffuse atherosclerosis.  Left ventriculogram showed normal left ventricular function with estimated ejection fraction of 55 to 60%. Impression:  1. A 95% left anterior descending lesion, successfully treated with a 3.0 x 18 Endeavor stent, postdilated to 3.6 mm in diameter. 2.  Normal left ventricular function. 3.  No abdominal aortic aneurysm. Recommendations: Continue aspirin Plavix for at least 1 year.   Past Medical History:  Diagnosis Date  . Angina pectoris ([place])   . Coronary artery disease   . Diabetes ([place])   . HTN (hypertension)   . Hypercholesteremia   . Hyperlipidemia   . Obesity   . Pain in joint     Past Surgical History:  Procedure Laterality Date  . APPENDECTOMY    . CARDIAC CATHETERIZATION    . CORONARY ANGIOPLASTY     DES LAD 10/28/08 (Dr. Irish Lack, [facility])  . ELBOW SURGERY Left 07/09/2018   Dr. Caralyn Guile  . FOOT SURGERY    . HAND SURGERY Left 07/09/2018   Dr. Caralyn Guile   . SHOULDER SURGERY Right 03/05/2018   Dr. Veverly Fells.     MEDICATIONS: . clopidogrel (PLAVIX) 75 MG tablet  . dapagliflozin propanediol (FARXIGA) 10 MG TABS tablet  . glucose blood test strip  . INVOKANA 100 MG TABS tablet  . lisinopril (PRINIVIL,ZESTRIL) 10 MG tablet  . metFORMIN (GLUCOPHAGE-XR) 750 MG 24 hr tablet  . Multiple Vitamin (MULTIVITAMIN) capsule  . nitroGLYCERIN (NITROSTAT) 0.4 MG SL tablet  . Omega-3 Fatty Acids (FISH OIL) 1000 MG CAPS  . simvastatin (ZOCOR) 20 MG tablet  . sitaGLIPtin (JANUVIA) 100 MG tablet   No current facility-administered medications for this encounter.     Myra Gianotti, PA-C Surgical Short Stay/Anesthesiology [facility] Phone [phone] [facility] Phone [phone] 11/13/2018 6:33 PM

## 2018-11-14 NOTE — Anesthesia Preprocedure Evaluation (Addendum)
Anesthesia Evaluation  Patient identified by MRN, date of birth, ID band Patient awake    Reviewed: Allergy & Precautions, NPO status , Patient's Chart, lab work & pertinent test results  Airway Mallampati: II  TM Distance: >3 FB Neck ROM: Full    Dental  (+) Dental Advisory Given   Pulmonary neg pulmonary ROS,    breath sounds clear to auscultation       Cardiovascular hypertension, Pt. on medications + CAD   Rhythm:Regular Rate:Normal  12/2017: Negative ETT. No ischemia.   Neuro/Psych negative neurological ROS     GI/Hepatic negative GI ROS, Neg liver ROS,   Endo/Other  diabetes, Type 2, Oral Hypoglycemic Agents  Renal/GU negative Renal ROS     Musculoskeletal   Abdominal   Peds  Hematology negative hematology ROS (+)   Anesthesia Other Findings   Reproductive/Obstetrics                            Lab Results  Component Value Date   WBC 7.7 11/12/2018   HGB 14.4 11/12/2018   HCT 43.9 11/12/2018   MCV 90.1 11/12/2018   PLT 275 11/12/2018   Lab Results  Component Value Date   CREATININE 1.01 11/12/2018   BUN 18 11/12/2018   NA 137 11/12/2018   K 4.4 11/12/2018   CL 105 11/12/2018   CO2 21 (L) 11/12/2018    Anesthesia Physical Anesthesia Plan  ASA: III  Anesthesia Plan: General   Post-op Pain Management:    Induction: Intravenous  PONV Risk Score and Plan: 2 and Dexamethasone, Ondansetron and Treatment may vary due to age or medical condition  Airway Management Planned: Oral ETT  Additional Equipment:   Intra-op Plan:   Post-operative Plan: Extubation in OR  Informed Consent: I have reviewed the patients History and Physical, chart, labs and discussed the procedure including the risks, benefits and alternatives for the proposed anesthesia with the patient or authorized representative who has indicated his/her understanding and acceptance.     Dental advisory  given  Plan Discussed with: CRNA  Anesthesia Plan Comments: ( )       Anesthesia Quick Evaluation

## 2018-11-19 ENCOUNTER — Other Ambulatory Visit: Payer: Self-pay

## 2018-11-19 ENCOUNTER — Encounter (HOSPITAL_COMMUNITY): Payer: Self-pay

## 2018-11-19 ENCOUNTER — Encounter (HOSPITAL_COMMUNITY)
Admission: RE | Disposition: A | Discharge status: Home / Self Care | Payer: Self-pay | Source: Home / Self Care | Attending: General Surgery

## 2018-11-19 ENCOUNTER — Ambulatory Visit (HOSPITAL_COMMUNITY): Payer: 59 | Admitting: Certified Registered Nurse Anesthetist

## 2018-11-19 ENCOUNTER — Observation Stay (HOSPITAL_COMMUNITY)
Admission: RE | Admit: 2018-11-19 | Discharge: 2018-11-20 | Disposition: A | Discharge status: Home / Self Care | Payer: 59 | Attending: General Surgery | Admitting: General Surgery

## 2018-11-19 ENCOUNTER — Ambulatory Visit (HOSPITAL_COMMUNITY): Payer: 59 | Admitting: Vascular Surgery

## 2018-11-19 DIAGNOSIS — E890 Postprocedural hypothyroidism: Secondary | ICD-10-CM

## 2018-11-19 DIAGNOSIS — Z9089 Acquired absence of other organs: Secondary | ICD-10-CM

## 2018-11-19 DIAGNOSIS — E119 Type 2 diabetes mellitus without complications: Secondary | ICD-10-CM | POA: Insufficient documentation

## 2018-11-19 DIAGNOSIS — Z7984 Long term (current) use of oral hypoglycemic drugs: Secondary | ICD-10-CM | POA: Insufficient documentation

## 2018-11-19 DIAGNOSIS — Z79899 Other long term (current) drug therapy: Secondary | ICD-10-CM | POA: Insufficient documentation

## 2018-11-19 DIAGNOSIS — D34 Benign neoplasm of thyroid gland: Secondary | ICD-10-CM | POA: Diagnosis not present

## 2018-11-19 DIAGNOSIS — Z9889 Other specified postprocedural states: Secondary | ICD-10-CM

## 2018-11-19 DIAGNOSIS — Z833 Family history of diabetes mellitus: Secondary | ICD-10-CM | POA: Diagnosis not present

## 2018-11-19 DIAGNOSIS — Z7902 Long term (current) use of antithrombotics/antiplatelets: Secondary | ICD-10-CM | POA: Insufficient documentation

## 2018-11-19 DIAGNOSIS — E041 Nontoxic single thyroid nodule: Secondary | ICD-10-CM | POA: Diagnosis present

## 2018-11-19 DIAGNOSIS — I2510 Atherosclerotic heart disease of native coronary artery without angina pectoris: Secondary | ICD-10-CM | POA: Insufficient documentation

## 2018-11-19 DIAGNOSIS — I10 Essential (primary) hypertension: Secondary | ICD-10-CM | POA: Insufficient documentation

## 2018-11-19 HISTORY — PX: THYROID LOBECTOMY: SHX420

## 2018-11-19 HISTORY — DX: Nontoxic single thyroid nodule: E04.1

## 2018-11-19 LAB — GLUCOSE, CAPILLARY
GLUCOSE-CAPILLARY: 261 mg/dL — AB (ref 70–99)
Glucose-Capillary: 221 mg/dL — ABNORMAL HIGH (ref 70–99)
Glucose-Capillary: 200 mg/dL — ABNORMAL HIGH (ref 70–99)
Glucose-Capillary: 310 mg/dL — ABNORMAL HIGH (ref 70–99)

## 2018-11-19 SURGERY — LOBECTOMY, THYROID
Anesthesia: General | Site: Neck | Laterality: Right

## 2018-11-19 MED ORDER — HYDROMORPHONE HCL 1 MG/ML IJ SOLN: 1.0000 mg | INTRAMUSCULAR | Status: DC | PRN

## 2018-11-19 MED ORDER — FENTANYL CITRATE (PF) 100 MCG/2ML IJ SOLN
25.0000 ug | INTRAMUSCULAR | Status: DC | PRN
  Administered 2018-11-19 (×2): 50 ug via INTRAVENOUS
  Administered 2018-11-19: 25 ug via INTRAVENOUS

## 2018-11-19 MED ORDER — FENTANYL CITRATE (PF) 250 MCG/5ML IJ SOLN
INTRAMUSCULAR | Status: DC | PRN
  Administered 2018-11-19: 25 ug via INTRAVENOUS
  Administered 2018-11-19 (×2): 50 ug via INTRAVENOUS
  Administered 2018-11-19: 25 ug via INTRAVENOUS
  Administered 2018-11-19 (×2): 50 ug via INTRAVENOUS

## 2018-11-19 MED ORDER — ROCURONIUM BROMIDE 50 MG/5ML IV SOSY
PREFILLED_SYRINGE | INTRAVENOUS | Status: DC | PRN
  Administered 2018-11-19: 20 mg via INTRAVENOUS
  Administered 2018-11-19: 10 mg via INTRAVENOUS
  Administered 2018-11-19 (×2): 50 mg via INTRAVENOUS

## 2018-11-19 MED ORDER — PROMETHAZINE HCL 25 MG/ML IJ SOLN: 6.2500 mg | INTRAMUSCULAR | Status: DC | PRN

## 2018-11-19 MED ORDER — LIDOCAINE HCL 4 % EX SOLN
CUTANEOUS | Status: DC | PRN
  Administered 2018-11-19: 60 mL via TOPICAL

## 2018-11-19 MED ORDER — DEXAMETHASONE SODIUM PHOSPHATE 10 MG/ML IJ SOLN
INTRAMUSCULAR | Status: AC
  Filled 2018-11-19: qty 1

## 2018-11-19 MED ORDER — SODIUM CHLORIDE 0.9 % IV SOLN
INTRAVENOUS | Status: DC
  Administered 2018-11-19 – 2018-11-20 (×2): via INTRAVENOUS

## 2018-11-19 MED ORDER — NITROGLYCERIN 0.4 MG SL SUBL: 0.4000 mg | SUBLINGUAL_TABLET | SUBLINGUAL | Status: DC | PRN

## 2018-11-19 MED ORDER — LISINOPRIL 10 MG PO TABS
10.0000 mg | ORAL_TABLET | Freq: Every day | ORAL | Status: DC
  Administered 2018-11-19: 10 mg via ORAL
  Filled 2018-11-19: qty 1

## 2018-11-19 MED ORDER — MIDAZOLAM HCL 2 MG/2ML IJ SOLN
INTRAMUSCULAR | Status: DC | PRN
  Administered 2018-11-19: 2 mg via INTRAVENOUS

## 2018-11-19 MED ORDER — TRAMADOL HCL 50 MG PO TABS: 50.0000 mg | ORAL_TABLET | Freq: Four times a day (QID) | ORAL | 0 refills | Status: AC | PRN

## 2018-11-19 MED ORDER — TRAMADOL HCL 50 MG PO TABS: 50.0000 mg | ORAL_TABLET | Freq: Four times a day (QID) | ORAL | Status: DC | PRN

## 2018-11-19 MED ORDER — LIDOCAINE 2% (20 MG/ML) 5 ML SYRINGE
INTRAMUSCULAR | Status: DC | PRN
  Administered 2018-11-19: 100 mg via INTRAVENOUS

## 2018-11-19 MED ORDER — METFORMIN HCL ER 750 MG PO TB24
750.0000 mg | ORAL_TABLET | Freq: Two times a day (BID) | ORAL | Status: DC
  Filled 2018-11-19: qty 1

## 2018-11-19 MED ORDER — PHENOL 1.4 % MT LIQD: 1.0000 | OROMUCOSAL | Status: DC | PRN

## 2018-11-19 MED ORDER — BUPIVACAINE HCL (PF) 0.25 % IJ SOLN
INTRAMUSCULAR | Status: AC
  Filled 2018-11-19: qty 30

## 2018-11-19 MED ORDER — DEXTROSE-NACL 5-0.9 % IV SOLN: INTRAVENOUS | Status: DC

## 2018-11-19 MED ORDER — ONDANSETRON HCL 4 MG/2ML IJ SOLN: 4.0000 mg | Freq: Four times a day (QID) | INTRAMUSCULAR | Status: DC | PRN

## 2018-11-19 MED ORDER — PHENYLEPHRINE 40 MCG/ML (10ML) SYRINGE FOR IV PUSH (FOR BLOOD PRESSURE SUPPORT)
PREFILLED_SYRINGE | INTRAVENOUS | Status: AC
  Filled 2018-11-19: qty 10

## 2018-11-19 MED ORDER — PHENYLEPHRINE HCL 10 MG/ML IJ SOLN
INTRAMUSCULAR | Status: DC | PRN
  Administered 2018-11-19 (×4): 80 ug via INTRAVENOUS

## 2018-11-19 MED ORDER — PROPOFOL 10 MG/ML IV BOLUS
INTRAVENOUS | Status: DC | PRN
Start: 2018-11-19 — End: 2018-11-19
  Administered 2018-11-19: 200 mg via INTRAVENOUS

## 2018-11-19 MED ORDER — ONDANSETRON HCL 4 MG/2ML IJ SOLN
INTRAMUSCULAR | Status: DC | PRN
  Administered 2018-11-19: 4 mg via INTRAVENOUS

## 2018-11-19 MED ORDER — INSULIN ASPART 100 UNIT/ML ~~LOC~~ SOLN
0.0000 [IU] | Freq: Every day | SUBCUTANEOUS | Status: DC
  Administered 2018-11-19: 3 [IU] via SUBCUTANEOUS

## 2018-11-19 MED ORDER — BUPIVACAINE HCL 0.25 % IJ SOLN
INTRAMUSCULAR | Status: DC | PRN
  Administered 2018-11-19: 8 mL

## 2018-11-19 MED ORDER — CHLORHEXIDINE GLUCONATE CLOTH 2 % EX PADS: 6.0000 | MEDICATED_PAD | Freq: Once | CUTANEOUS | Status: DC

## 2018-11-19 MED ORDER — MIDAZOLAM HCL 2 MG/2ML IJ SOLN
INTRAMUSCULAR | Status: AC
  Filled 2018-11-19: qty 2

## 2018-11-19 MED ORDER — LACTATED RINGERS IV SOLN
INTRAVENOUS | Status: DC
  Administered 2018-11-19 (×2): via INTRAVENOUS

## 2018-11-19 MED ORDER — FENTANYL CITRATE (PF) 250 MCG/5ML IJ SOLN
INTRAMUSCULAR | Status: AC
  Filled 2018-11-19: qty 5

## 2018-11-19 MED ORDER — ONDANSETRON HCL 4 MG/2ML IJ SOLN
INTRAMUSCULAR | Status: AC
  Filled 2018-11-19: qty 2

## 2018-11-19 MED ORDER — STERILE WATER FOR IRRIGATION IR SOLN
Status: DC | PRN
  Administered 2018-11-19: 1000 mL

## 2018-11-19 MED ORDER — CEFAZOLIN SODIUM-DEXTROSE 2-4 GM/100ML-% IV SOLN
2.0000 g | INTRAVENOUS | Status: AC
  Administered 2018-11-19: 2 g via INTRAVENOUS

## 2018-11-19 MED ORDER — HEMOSTATIC AGENTS (NO CHARGE) OPTIME
TOPICAL | Status: DC | PRN
  Administered 2018-11-19: 1

## 2018-11-19 MED ORDER — HYDROCODONE-ACETAMINOPHEN 5-325 MG PO TABS
ORAL_TABLET | ORAL | Status: AC
  Filled 2018-11-19: qty 1

## 2018-11-19 MED ORDER — HYDROCODONE-ACETAMINOPHEN 5-325 MG PO TABS
1.0000 | ORAL_TABLET | ORAL | Status: DC | PRN
  Administered 2018-11-19: 2 via ORAL
  Administered 2018-11-19: 1 via ORAL
  Filled 2018-11-19: qty 2

## 2018-11-19 MED ORDER — FENTANYL CITRATE (PF) 100 MCG/2ML IJ SOLN
INTRAMUSCULAR | Status: AC
  Filled 2018-11-19: qty 2

## 2018-11-19 MED ORDER — EPHEDRINE SULFATE 50 MG/ML IJ SOLN
INTRAMUSCULAR | Status: DC | PRN
Start: 2018-11-19 — End: 2018-11-19
  Administered 2018-11-19 (×2): 10 mg via INTRAVENOUS

## 2018-11-19 MED ORDER — INSULIN ASPART 100 UNIT/ML ~~LOC~~ SOLN
0.0000 [IU] | Freq: Three times a day (TID) | SUBCUTANEOUS | Status: DC
  Administered 2018-11-19: 11 [IU] via SUBCUTANEOUS

## 2018-11-19 MED ORDER — ONDANSETRON 4 MG PO TBDP: 4.0000 mg | ORAL_TABLET | Freq: Four times a day (QID) | ORAL | Status: DC | PRN

## 2018-11-19 MED ORDER — CANAGLIFLOZIN 100 MG PO TABS
100.0000 mg | ORAL_TABLET | Freq: Every day | ORAL | Status: DC
  Filled 2018-11-19: qty 1

## 2018-11-19 MED ORDER — ROCURONIUM BROMIDE 50 MG/5ML IV SOSY
PREFILLED_SYRINGE | INTRAVENOUS | Status: AC
  Filled 2018-11-19: qty 10

## 2018-11-19 MED ORDER — LIDOCAINE 2% (20 MG/ML) 5 ML SYRINGE
INTRAMUSCULAR | Status: AC
  Filled 2018-11-19: qty 10

## 2018-11-19 MED ORDER — PROPOFOL 10 MG/ML IV BOLUS
INTRAVENOUS | Status: AC
  Filled 2018-11-19: qty 20

## 2018-11-19 MED ORDER — DEXAMETHASONE SODIUM PHOSPHATE 10 MG/ML IJ SOLN
INTRAMUSCULAR | Status: DC | PRN
  Administered 2018-11-19: 10 mg via INTRAVENOUS

## 2018-11-19 MED ORDER — ACETAMINOPHEN 10 MG/ML IV SOLN
1000.0000 mg | Freq: Once | INTRAVENOUS | Status: AC
  Administered 2018-11-19: 1000 mg via INTRAVENOUS
  Filled 2018-11-19: qty 100

## 2018-11-19 MED ORDER — 0.9 % SODIUM CHLORIDE (POUR BTL) OPTIME
TOPICAL | Status: DC | PRN
  Administered 2018-11-19: 1000 mL

## 2018-11-19 MED ORDER — ADULT MULTIVITAMIN W/MINERALS CH: 1.0000 | ORAL_TABLET | Freq: Every day | ORAL | Status: DC

## 2018-11-19 MED ORDER — SUGAMMADEX SODIUM 200 MG/2ML IV SOLN
INTRAVENOUS | Status: DC | PRN
  Administered 2018-11-19: 250 mg via INTRAVENOUS

## 2018-11-19 MED ORDER — ACETAMINOPHEN 325 MG PO TABS: 650.0000 mg | ORAL_TABLET | Freq: Three times a day (TID) | ORAL | Status: DC | PRN

## 2018-11-19 MED ORDER — CEFAZOLIN SODIUM-DEXTROSE 2-4 GM/100ML-% IV SOLN
INTRAVENOUS | Status: AC
  Filled 2018-11-19: qty 100

## 2018-11-19 SURGICAL SUPPLY — 48 items
ATTRACTOMAT 16X20 MAGNETIC DRP (DRAPES) ×2 IMPLANT
BLADE SURG 15 STRL LF DISP TIS (BLADE) ×1 IMPLANT
BLADE SURG 15 STRL SS (BLADE) ×1
CHLORAPREP W/TINT 26ML (MISCELLANEOUS) ×2 IMPLANT
CLIP VESOCCLUDE MED 24/CT (CLIP) ×2 IMPLANT
CLIP VESOCCLUDE SM WIDE 24/CT (CLIP) ×2 IMPLANT
CONT SPEC 4OZ CLIKSEAL STRL BL (MISCELLANEOUS) ×2 IMPLANT
COVER SURGICAL LIGHT HANDLE (MISCELLANEOUS) ×2 IMPLANT
DERMABOND ADVANCED (GAUZE/BANDAGES/DRESSINGS) ×1
DERMABOND ADVANCED .7 DNX12 (GAUZE/BANDAGES/DRESSINGS) ×1 IMPLANT
DRAPE LAPAROTOMY 100X72 PEDS (DRAPES) ×2 IMPLANT
DRAPE UTILITY XL STRL (DRAPES) ×3 IMPLANT
ELECT COATED BLADE 2.86 ST (ELECTRODE) ×2 IMPLANT
ELECT REM PT RETURN 9FT ADLT (ELECTROSURGICAL) ×2
ELECTRODE REM PT RTRN 9FT ADLT (ELECTROSURGICAL) ×1 IMPLANT
GAUZE SPONGE 4X4 12PLY STRL (GAUZE/BANDAGES/DRESSINGS) ×2 IMPLANT
GAUZE SPONGE 4X4 16PLY XRAY LF (GAUZE/BANDAGES/DRESSINGS) ×1 IMPLANT
GLOVE BIO SURGEON STRL SZ7.5 (GLOVE) ×2 IMPLANT
GLOVE BIOGEL PI IND STRL 8 (GLOVE) IMPLANT
GLOVE BIOGEL PI INDICATOR 8 (GLOVE) ×1
GLOVE ECLIPSE 7.5 STRL STRAW (GLOVE) ×1 IMPLANT
GLOVE SURG SIGNA 7.5 PF LTX (GLOVE) ×1 IMPLANT
GOWN STRL REUS W/ TWL LRG LVL3 (GOWN DISPOSABLE) ×1 IMPLANT
GOWN STRL REUS W/ TWL XL LVL3 (GOWN DISPOSABLE) ×1 IMPLANT
GOWN STRL REUS W/TWL LRG LVL3 (GOWN DISPOSABLE) ×1
GOWN STRL REUS W/TWL XL LVL3 (GOWN DISPOSABLE) ×3
HEMOSTAT SURGICEL 2X4 FIBR (HEMOSTASIS) ×2 IMPLANT
ILLUMINATOR WAVEGUIDE N/F (MISCELLANEOUS) ×2 IMPLANT
KIT BASIN OR (CUSTOM PROCEDURE TRAY) ×2 IMPLANT
KIT TURNOVER KIT B (KITS) ×2 IMPLANT
NDL HYPO 25GX1X1/2 BEV (NEEDLE) ×1 IMPLANT
NEEDLE HYPO 25GX1X1/2 BEV (NEEDLE) ×2 IMPLANT
NS IRRIG 1000ML POUR BTL (IV SOLUTION) ×2 IMPLANT
PACK SURGICAL SETUP 50X90 (CUSTOM PROCEDURE TRAY) ×2 IMPLANT
PAD ARMBOARD 7.5X6 YLW CONV (MISCELLANEOUS) ×2 IMPLANT
PENCIL SMOKE EVACUATOR (MISCELLANEOUS) ×1 IMPLANT
SHEARS HARMONIC 9CM CVD (BLADE) ×2 IMPLANT
SPONGE INTESTINAL PEANUT (DISPOSABLE) ×2 IMPLANT
SUT MNCRL AB 4-0 PS2 18 (SUTURE) ×2 IMPLANT
SUT SILK 2 0 (SUTURE) ×1
SUT SILK 2-0 18XBRD TIE 12 (SUTURE) ×1 IMPLANT
SUT SILK 3 0 (SUTURE) ×1
SUT SILK 3-0 18XBRD TIE 12 (SUTURE) ×1 IMPLANT
SUT VIC AB 3-0 SH 18 (SUTURE) ×3 IMPLANT
SYR BULB 3OZ (MISCELLANEOUS) ×2 IMPLANT
SYR CONTROL 10ML LL (SYRINGE) ×2 IMPLANT
TOWEL GREEN STERILE FF (TOWEL DISPOSABLE) ×1 IMPLANT
TUBE CONNECTING 12X1/4 (SUCTIONS) ×2 IMPLANT

## 2018-11-19 NOTE — Op Note (Signed)
11/19/2018  11:59 AM  PATIENT:  Eddie Garner  58 y.o. male  PRE-OPERATIVE DIAGNOSIS:  Right thyroid nodule  POST-OPERATIVE DIAGNOSIS:  Right thyroid nodule  PROCEDURE:  Procedure(s): RIGHT THYROID LOBECTOMY (Right)  SURGEON:  Surgeon(s) and Role:    Ralene Ok, MD - Primary    Alphonsa Overall, MD - Assisting  ANESTHESIA:   local and general  EBL:  25 mL   BLOOD ADMINISTERED:none  DRAINS: none   LOCAL MEDICATIONS USED:  BUPIVICAINE   SPECIMEN:  Source of Specimen:  Right thyroid lobe with suture marking superior lobe  DISPOSITION OF SPECIMEN:  PATHOLOGY  COUNTS:  YES  TOURNIQUET:  * No tourniquets in log *  DICTATION: .Dragon Dictation  Counts: reported as correct x 2   Findings: large right thyroid nodule, no nodule palpated in the left lobe  Indications for procedure: The patient is a 78M who had a large right thyroid nodule showing Hurthle cells with a Bethesda 4.  Details of the procedure:  The patient was taken back to the operating room. The patient was placed in supine position with bilateral SCDs in place.  The patient was prepped and draped in the usual sterile fashion. After appropriate anitbiotics were confirmed, a time-out was confirmed and all facts were verified.   A 4 cm incision was made approximately 2 fingerbreadths above the sternal notch. Bovie cautery was used to maintain hemostasis dissection was carried down through the platysma. The platysma was elevated and flaps were created superiorly and inferiorly to the thyroid cartilage as well as the sternal notch, repsectively. The strap muscles were identified in the midline and separated. Right-sided strap muscles were elevated off the anterior surface of the thyroid. This dissection was carried laterally. The middle thyroid vein was identified and doubly ligated. We proceeded to dissect away the superior lobe and Kitners were used to gently dissect the surrounding musculature from the thyroid.  The superior thyroid vessels were identified and doubly ligated with clips and transected with a Harmonic scalpel. At this time this freed up the superior lobe was able to deliver this into the wound. We also identified the superior parathyroid gland which we preserved.   We continued to dissect the thyroid off of the trachea from lateral to medial direction. The right recurrent laryngeal nerve was not identified, however dissection was kept right on the thyroid lobe.  The inferior thyroid vessels were identified ligated with clips. At this time Berry's ligament was dissected away from the trachea. This delivered the right lobe of the thyroid into the wound and the harmonic scalpel was used to divide the thyroid in the midline. A superior stitch was then placed in the superior thyroid lobe.   The area was irrigated out. The dissection bed was hemostatic. We placed fibrillar hemostatic agent into the wound. Strap muscles were then reapproximated in the midline with interrupted 3-0 Vicryl stitches. The platysma was reapproximated using 3-0 Vicryl stitches in interrupted fashion. Skin was then reapproximated using a running subcuticular 4-0 Monocryl. The skin was then dressed with Dermabond. The patient was taken to the recovery room in stable condition.    PLAN OF CARE: Admit for overnight observation  PATIENT DISPOSITION:  PACU - hemodynamically stable.   Delay start of Pharmacological VTE agent (>24hrs) due to surgical blood loss or risk of bleeding: yes

## 2018-11-19 NOTE — Discharge Instructions (Signed)
[facility], [place] [phone]  THYROID SURGERY: POST OP INSTRUCTIONS  Always review your discharge instruction sheet given to you by the facility where your surgery was performed.  IF YOU HAVE DISABILITY OR FAMILY LEAVE FORMS, YOU MUST BRING THEM TO THE OFFICE FOR PROCESSING.  PLEASE DO NOT GIVE THEM TO YOUR DOCTOR.  1. A prescription for pain medication may be given to you upon discharge.  Take your pain medication as prescribed, if needed.  If narcotic pain medicine is not needed, then you may take acetaminophen (Tylenol) or ibuprofen (Advil) as needed. 2. Take your usually prescribed medications unless otherwise directed. 3. If you need a refill on your pain medication, please contact your pharmacy. They will contact our office to request authorization.  Prescriptions will not be filled after 5pm or on week-ends. 4. You should follow a light diet the first 24 hours after arrival home, such as soup and crackers, etc.  Be sure to include lots of fluids daily.  Resume your normal diet the day after surgery. 5. Most patients will experience some swelling and bruising on the chest and neck area.  Ice packs will help.  Swelling and bruising can take several days to resolve.  6. It is common to experience some constipation if taking pain medication after surgery.  Increasing fluid intake and taking a stool softener will usually help or prevent this problem from occurring.  A mild laxative (Milk of Magnesia or Miralax) should be taken according to package directions if there are no bowel movements after 48 hours. 7. Unless discharge instructions indicate otherwise, you may remove your bandages 24-48 hours after surgery, and you may shower at that time.  You may have steri-strips (small skin tapes) in place directly over the incision.  These strips should be left on the skin for 7-10 days.  If your surgeon used skin glue on the incision, you may shower in 24 hours.  The glue will flake  off over the next 2-3 weeks.  Any sutures or staples will be removed at the office during your follow-up visit. 8. ACTIVITIES:  You may resume regular (light) daily activities beginning the next day--such as daily self-care, walking, climbing stairs--gradually increasing activities as tolerated.  You may have sexual intercourse when it is comfortable.  Refrain from any heavy lifting or straining until approved by your doctor. a. You may drive when you no longer are taking prescription pain medication, you can comfortably wear a seatbelt, and you can safely maneuver your car and apply brakes b. RETURN TO WORK:  __________________________________________________________ 9. You should see your doctor in the office for a follow-up appointment approximately two weeks after your surgery.  Make sure that you call for this appointment within a day or two after you arrive home to insure a convenient appointment time. 10. OTHER INSTRUCTIONS: ____________________________________________________________________________ _________________________________________________________________________________________________________________ _________________________________________________________________________________________________________________   WHEN TO CALL YOUR DOCTOR: 1. Fever over 101.0 2. Inability to urinate 3. Nausea and/or vomiting 4. Extreme swelling or bruising 5. Continued bleeding from incision. 6. Increased pain, redness, or drainage from the incision. 7. Difficulty swallowing or breathing 8. Muscle cramping or spasms. 9. Numbness or tingling in hands or feet or around lips.  The clinic staff is available to answer your questions during regular business hours.  Please dont hesitate to call and ask to speak to one of the nurses if you have concerns.  For further questions, please visit [email or web address]

## 2018-11-19 NOTE — Anesthesia Procedure Notes (Signed)
Procedure Name: Intubation Date/Time: 11/19/2018 10:51 AM Performed by: Kathryne Hitch, CRNA Pre-anesthesia Checklist: Emergency Drugs available, Patient identified, Suction available, Timeout performed and Patient being monitored Patient Re-evaluated:Patient Re-evaluated prior to induction Oxygen Delivery Method: Circle system utilized Preoxygenation: Pre-oxygenation with 100% oxygen Induction Type: IV induction Ventilation: Mask ventilation without difficulty and Oral airway inserted - appropriate to patient size Laryngoscope Size: Sabra Heck and 2 Grade View: Grade III Tube type: Oral Tube size: 7.5 mm Number of attempts: 1 Airway Equipment and Method: Stylet Placement Confirmation: ETT inserted through vocal cords under direct vision,  positive ETCO2 and breath sounds checked- equal and bilateral Secured at: 23 cm Tube secured with: Tape Dental Injury: Teeth and Oropharynx as per pre-operative assessment

## 2018-11-19 NOTE — Transfer of Care (Signed)
Immediate Anesthesia Transfer of Care Note  Patient: JOURNEE KOHEN  Procedure(s) Performed: RIGHT THYROID LOBECTOMY (Right Neck)  Patient Location: PACU  Anesthesia Type:General  Level of Consciousness: drowsy and patient cooperative  Airway & Oxygen Therapy: Patient Spontanous Breathing  Post-op Assessment: Report given to RN and Post -op Vital signs reviewed and stable  Post vital signs: Reviewed and stable  Last Vitals:  Vitals Value Taken Time  BP    Temp    Pulse 84 11/19/2018 12:05 PM  Resp 15 11/19/2018 12:05 PM  SpO2 93 % 11/19/2018 12:05 PM  Vitals shown include unvalidated device data.  Last Pain:  Vitals:   11/19/18 0943  TempSrc:   PainSc: 0-No pain         Complications: No apparent anesthesia complications

## 2018-11-19 NOTE — Anesthesia Postprocedure Evaluation (Signed)
Anesthesia Post Note  Patient: Eddie Garner  Procedure(s) Performed: RIGHT THYROID LOBECTOMY (Right Neck)     Patient location during evaluation: PACU Anesthesia Type: General Level of consciousness: awake and alert Pain management: pain level controlled Vital Signs Assessment: post-procedure vital signs reviewed and stable Respiratory status: spontaneous breathing, nonlabored ventilation, respiratory function stable and patient connected to nasal cannula oxygen Cardiovascular status: blood pressure returned to baseline and stable Postop Assessment: no apparent nausea or vomiting Anesthetic complications: no    Last Vitals:  Vitals:   11/19/18 1500 11/19/18 1615  BP: (!) 156/84 (!) 157/99  Pulse: 94 97  Resp: 16 17  Temp:  36.7 C  SpO2: 100% 96%    Last Pain:  Vitals:   11/19/18 1624  TempSrc:   PainSc: (P) 6                  Tiajuana Amass

## 2018-11-19 NOTE — H&P (Signed)
  History of Present Illness  The patient is a 58 year old male who presents with a thyroid nodule. Referred by: Mat Carne, PA-C Chief Complaint: Right thyroid nodule  Patient is a 58 year old male with a history of CAD, diabetes, comes in with a two-day history of a right thyroid nodule. Patient recently underwent ultrasound which revealed a large 4 cm nodules on the right side as well as a left 1 cm nodule. 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 I did review the pathology as well as ultrasound personally. He was then referred for further evaluation and management.  Patient recently underwent right rotator cuff surgery as well as left elbow and wrist surgery and was off his Plavix prior to. Patient sees Dr. Irish Lack cardiologist.    Past Surgical History  Shoulder Surgery  Right.  Allergies  No Known Drug Allergies [10/12/2018]: Allergies Reconciled   Medication History  Clopidogrel Bisulfate (75MG  Tablet, Oral) Active. Farxiga (10MG  Tablet, Oral) Active. Simvastatin (20MG  Tablet, Oral) Active. Lisinopril (10MG  Tablet, Oral) Active. metFORMIN HCl ER (750MG  Tablet ER 24HR, Oral) Active. Januvia (100MG  Tablet, Oral) Active. Medications Reconciled  Social History  Alcohol use  Occasional alcohol use. No caffeine use   Family History  Diabetes Mellitus  Father.  Other Problems  Diabetes Mellitus     Review of Systems Male Genitourinary Not Present- Blood in Urine, Change in Urinary Stream, Frequency, Impotence, Nocturia, Painful Urination, Urgency and Urine Leakage. Neurological Not Present- Decreased Memory, Fainting, Headaches, Numbness, Seizures, Tingling, Tremor, Trouble walking and Weakness. All other systems negative  BP 137/89   Pulse 70   Temp 98.1 F (36.7 C) (Oral)   Resp 18   SpO2 97%   Physical Exam  The physical exam findings are as  follows: Note:Constitutional: No acute distress, conversant, appears stated age  Eyes: Anicteric sclerae, moist conjunctiva, no lid lag  Neck: No thyromegaly, trachea midline, no cervical lymphadenopathy, right thyroid nodule, large  Lungs: Clear to auscultation biilaterally, normal respiratory effot  Cardiovascular: regular rate & rhythm, no murmurs, no peripheal edema, pedal pulses 2+  GI: Soft, no masses or hepatosplenomegaly, non-tender to palpation  MSK: Normal gait, no clubbing cyanosis, edema  Skin: No rashes, palpation reveals normal skin turgor  Psychiatric: Appropriate judgment and insight, oriented to person, place, and time    Assessment & Plan  THYROID NODULE (E04.1) Impression: 58 year old male with a right thyroid nodule, 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  We will obtain cardiac clearance and the okay by his cardiologist to be off his Plavix.  1. The patient would like to proceed to the operating room for right thyroidectomy 2. All risks and benefits were discussed with the patient to generally include: infection, bleeding, damage to the recurrent laryngeal nerve, damage to parathyroid glands, and possible need for further surgery. Alternatives were offered and described. All questions were answered and the patient voiced understanding of the procedure and wishes to proceed.

## 2018-11-20 ENCOUNTER — Encounter (HOSPITAL_COMMUNITY): Payer: Self-pay | Admitting: General Surgery

## 2018-11-20 DIAGNOSIS — D34 Benign neoplasm of thyroid gland: Secondary | ICD-10-CM | POA: Diagnosis not present

## 2018-11-20 NOTE — Plan of Care (Signed)
  Problem: Education: Goal: Knowledge of General Education information will improve Description Including pain rating scale, medication(s)/side effects and non-pharmacologic comfort measures Outcome: Progressing   Problem: Clinical Measurements: Goal: Postoperative complications will be avoided or minimized Outcome: Progressing   Problem: Skin Integrity: Goal: Demonstration of wound healing without infection will improve Outcome: Progressing   

## 2018-11-20 NOTE — Progress Notes (Signed)
Patient ID: Eddie Garner, male   DOB: 1961-05-11, 58 y.o.   MRN: [id number]   Doing well No complaints Voice normal Neck incision clean, no hematoma  Plan: Discharge home

## 2018-11-20 NOTE — Discharge Summary (Signed)
Physician Discharge Summary  Patient ID: Eddie Garner MRN: [id number] DOB/AGE: 1961-07-17 58 y.o.  Admit date: 11/19/2018 Discharge date: 11/20/2018  Admission Diagnoses:  Discharge Diagnoses:  Active Problems:   S/P thyroidectomy   Discharged Condition: good  Hospital Course: uneventful post op recovery.  Discharged home POD#1  Consults: None  Significant Diagnostic Studies:   Treatments: surgery: thyroidectomy  Discharge Exam: Blood pressure 136/83, pulse 68, temperature 98.1 F (36.7 C), temperature source Oral, resp. rate 18, height 5\' 11"  (1.803 m), weight 108 kg, SpO2 96 %. General appearance: alert, cooperative and no distress Incision/Wound:incision clean  Disposition: Discharge disposition: 01-Home or Self Care        Allergies as of 11/20/2018   No Known Allergies     Medication List    TAKE these medications   clopidogrel 75 MG tablet Commonly known as:  PLAVIX TAKE 1 TABLET (75 MG TOTAL) BY MOUTH DAILY WITH BREAKFAST. What changed:  See the new instructions.   FARXIGA 10 MG Tabs tablet Generic drug:  dapagliflozin propanediol Take 10 mg by mouth daily.   Fish Oil 1000 MG Caps Take 1,000 mg by mouth daily.   glucose blood test strip 1 each by Other route as needed for other. Use as instructed   INVOKANA 100 MG Tabs tablet Generic drug:  canagliflozin Take 100 mg by mouth daily.   lisinopril 10 MG tablet Commonly known as:  PRINIVIL,ZESTRIL Take 1 tablet (10 mg total) by mouth daily.   metFORMIN 750 MG 24 hr tablet Commonly known as:  GLUCOPHAGE-XR Take 750 mg by mouth 2 (two) times daily.   multivitamin capsule Take 1 capsule by mouth daily.   nitroGLYCERIN 0.4 MG SL tablet Commonly known as:  NITROSTAT Place 0.4 mg under the tongue every 5 (five) minutes as needed for chest pain.   simvastatin 20 MG tablet Commonly known as:  ZOCOR TAKE 1 TABLET BY MOUTH EVERYDAY AT BEDTIME What changed:    how much to take  how to  take this  when to take this   sitaGLIPtin 100 MG tablet Commonly known as:  JANUVIA Take 100 mg by mouth daily.   traMADol 50 MG tablet Commonly known as:  ULTRAM Take 1 tablet (50 mg total) by mouth every 6 (six) hours as needed.      Follow-up Information    Ralene Ok, MD. Schedule an appointment as soon as possible for a visit in 2 weeks.   Specialty:  General Surgery Why:  Post op visit Contact information: [address] [place] [address] [phone]           Signed: Coralie Keens 11/20/2018, 7:13 AM

## 2018-11-20 NOTE — Progress Notes (Signed)
[name] to be D/C'd  per MD order. Discussed with the patient and all questions fully answered.  VSS, Skin clean, dry and intact without evidence of skin break down, no evidence of skin tears noted.  IV catheter discontinued intact. Site without signs and symptoms of complications. Dressing and pressure applied.  An After Visit Summary was printed and given to the patient. Patient received prescription.  D/c education completed with patient/family including follow up instructions, medication list, d/c activities limitations if indicated, with other d/c instructions as indicated by MD - patient able to verbalize understanding, all questions fully answered.   Patient instructed to return to ED, call 911, or call MD for any changes in condition.   Patient to be escorted via [place], and D/C home via private auto.

## 2019-01-09 ENCOUNTER — Other Ambulatory Visit: Payer: Self-pay

## 2019-01-09 ENCOUNTER — Encounter: Payer: Self-pay | Admitting: Interventional Cardiology

## 2019-01-09 ENCOUNTER — Ambulatory Visit (INDEPENDENT_AMBULATORY_CARE_PROVIDER_SITE_OTHER): Payer: 59 | Admitting: Interventional Cardiology

## 2019-01-09 VITALS — BP 130/80 | HR 90 | Ht 71.0 in | Wt 241.6 lb

## 2019-01-09 DIAGNOSIS — I10 Essential (primary) hypertension: Secondary | ICD-10-CM | POA: Diagnosis not present

## 2019-01-09 DIAGNOSIS — E782 Mixed hyperlipidemia: Secondary | ICD-10-CM | POA: Diagnosis not present

## 2019-01-09 DIAGNOSIS — I25118 Atherosclerotic heart disease of native coronary artery with other forms of angina pectoris: Secondary | ICD-10-CM

## 2019-01-09 DIAGNOSIS — N529 Male erectile dysfunction, unspecified: Secondary | ICD-10-CM

## 2019-01-09 MED ORDER — SIMVASTATIN 20 MG PO TABS: ORAL_TABLET | ORAL | 3 refills | Status: DC

## 2019-01-09 MED ORDER — LISINOPRIL 10 MG PO TABS: 10.0000 mg | ORAL_TABLET | Freq: Every day | ORAL | 3 refills | Status: DC

## 2019-01-09 MED ORDER — SILDENAFIL CITRATE 20 MG PO TABS: ORAL_TABLET | ORAL | 0 refills | Status: DC

## 2019-01-09 MED ORDER — CLOPIDOGREL BISULFATE 75 MG PO TABS: ORAL_TABLET | ORAL | 3 refills | Status: DC

## 2019-01-09 NOTE — Patient Instructions (Signed)
Medication Instructions:  Your physician recommends that you continue on your current medications as directed. Please refer to the Current Medication list given to you today.  If you need a refill on your cardiac medications before your next appointment, please call your pharmacy.   Lab work: None Ordered  If you have labs (blood work) drawn today and your tests are completely normal, you will receive your results only by: Marland Kitchen MyChart Message (if you have MyChart) OR . A paper copy in the mail If you have any lab test that is abnormal or we need to change your treatment, we will call you to review the results.  Testing/Procedures: Your physician has requested that you have en exercise stress myoview. For further information please visit [email or web address]. Please follow instruction sheet, as given.     Follow-Up: At [facility], you and your health needs are our priority.  As part of our continuing mission to provide you with exceptional heart care, we have created designated Provider Care Teams.  These Care Teams include your primary Cardiologist (physician) and Advanced Practice Providers (APPs -  Physician Assistants and Nurse Practitioners) who all work together to provide you with the care you need, when you need it. . You will need a follow up appointment in 1 year.  Please call our office 2 months in advance to schedule this appointment.  You may see Casandra Doffing, MD or one of the following Advanced Practice Providers on your designated Care Team:   . Lyda Jester, PA-C . Dayna Dunn, PA-C . Ermalinda Barrios, PA-C  Any Other Special Instructions Will Be Listed Below (If Applicable).

## 2019-01-09 NOTE — Progress Notes (Signed)
Cardiology Office Note   Date:  01/09/2019   ID:  Eddie Garner, DOB [birth year], MRN [id number]  PCP:  Orpah Melter, MD    No chief complaint on file.  CAD  Wt Readings from Last 3 Encounters:  01/09/19 241 lb 9.6 oz (109.6 kg)  11/19/18 238 lb 1.6 oz (108 kg)  11/12/18 238 lb 1.6 oz (108 kg)       History of Present Illness: Eddie Garner is a 58 y.o. male   Who has had an LAD stent in 2009.    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  Working on getting HbA1C down, was at 9, in the past.  In 11/18, it was 8.3.  He continues to try to eat healthy.    Since the last visit, he had shoulder, elbow and thyroid surgery.  No cardiac issues.  He was out of work.  He gained some weight back after that.    A1C in 2/20 was 8.8.  Denies : Chest pain. Dizziness. Leg edema. Nitroglycerin use. Orthopnea. Palpitations. Paroxysmal nocturnal dyspnea. Shortness of breath. Syncope.   He exercises to a heart rate to a 145 bpm.  No sx.   Past Medical History:  Diagnosis Date  . Angina pectoris ([place])   . Coronary artery disease   . Diabetes ([place])   . HTN (hypertension)   . Hypercholesteremia   . Hyperlipidemia   . Obesity   . Pain in joint   . Right thyroid nodule     Past Surgical History:  Procedure Laterality Date  . APPENDECTOMY    . CARDIAC CATHETERIZATION    . CORONARY ANGIOPLASTY     DES LAD 10/28/08 (Dr. Irish Lack, [facility])  . ELBOW SURGERY Left 07/09/2018   Dr. Caralyn Guile  . FOOT SURGERY    . HAND SURGERY Left 07/09/2018   Dr. Caralyn Guile   . SHOULDER SURGERY Right 03/05/2018   Dr. Veverly Fells.   . THYROID LOBECTOMY Right 11/19/2018  . THYROID LOBECTOMY Right 11/19/2018   Procedure: RIGHT THYROID LOBECTOMY;  Surgeon: Ralene Ok, MD;  Location: [place];  Service: General;  Laterality: Right;     Current Outpatient Medications  Medication Sig Dispense Refill  . clopidogrel (PLAVIX) 75 MG  tablet TAKE 1 TABLET (75 MG TOTAL) BY MOUTH DAILY WITH BREAKFAST. (Patient taking differently: Take 75 mg by mouth daily. ) 90 tablet 2  . dapagliflozin propanediol (FARXIGA) 10 MG TABS tablet Take 10 mg by mouth daily.     Marland Kitchen glucose blood test strip 1 each by Other route as needed for other. Use as instructed    . lisinopril (PRINIVIL,ZESTRIL) 10 MG tablet Take 1 tablet (10 mg total) by mouth daily. 90 tablet 3  . metFORMIN (GLUCOPHAGE-XR) 500 MG 24 hr tablet Take 1,000 mg by mouth 2 (two) times daily.    . Multiple Vitamin (MULTIVITAMIN) capsule Take 1 capsule by mouth daily.    . nitroGLYCERIN (NITROSTAT) 0.4 MG SL tablet Place 0.4 mg under the tongue every 5 (five) minutes as needed for chest pain.     . Omega-3 Fatty Acids (FISH OIL) 1000 MG CAPS Take 1,000 mg by mouth daily.     . simvastatin (ZOCOR) 20 MG tablet TAKE 1 TABLET BY MOUTH EVERYDAY AT BEDTIME (Patient taking differently: Take 20 mg by mouth at bedtime. TAKE 1 TABLET BY MOUTH EVERYDAY AT BEDTIME) 90 tablet 3  . sitaGLIPtin (JANUVIA) 100 MG tablet Take  100 mg by mouth daily.    . traMADol (ULTRAM) 50 MG tablet Take 1 tablet (50 mg total) by mouth every 6 (six) hours as needed. 20 tablet 0   No current facility-administered medications for this visit.     Allergies:   Patient has no known allergies.    Social History:  The patient  reports that he has never smoked. He has never used smokeless tobacco. He reports current alcohol use. He reports that he does not use drugs.   Family History:  The patient's family history includes Atrial fibrillation in his father; CVA in his paternal grandfather; Coronary artery disease in his maternal grandfather; Diabetes in his paternal grandmother; Stroke in his paternal grandfather.    ROS:  Please see the history of present illness.   Otherwise, review of systems are positive for multiple surgeries.   All other systems are reviewed and negative.    PHYSICAL EXAM: VS:  BP 130/80   Pulse  90   Ht 5\' 11"  (1.803 m)   Wt 241 lb 9.6 oz (109.6 kg)   SpO2 94%   BMI 33.70 kg/m  , BMI Body mass index is 33.7 kg/m. GEN: Well nourished, well developed, in no acute distress  HEENT: normal  Neck: no JVD, carotid bruits, or masses Cardiac: RRR; no murmurs, rubs, or gallops,no edema  Respiratory:  clear to auscultation bilaterally, normal work of breathing GI: soft, nontender, nondistended, + BS MS: no deformity or atrophy  Skin: warm and dry, no rash Neuro:  Strength and sensation are intact Psych: euthymic mood, full affect   EKG:   The ekg ordered today demonstrates NSR, no ST changes   Recent Labs: 11/12/2018: BUN 18; Creatinine, Ser 1.01; Hemoglobin 14.4; Platelets 275; Potassium 4.4; Sodium 137   Lipid Panel No results found for: CHOL, TRIG, HDL, CHOLHDL, VLDL, LDLCALC, LDLDIRECT   Other studies Reviewed: Additional studies/ records that were reviewed today with results demonstrating: labs reviewed.   ASSESSMENT AND PLAN:  1. CAD: No angina.  Not using nitroglycerin.  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 2. DM: Not well controlled.  Continue healthy diet and regular exercise to try to bring down hemoglobin A1c.  He is on medications for his diabetes that also have cardiac benefits. 3. Hyperlipidemia: LDL 75 in 12/2018. Continue Zocor. 4. Erectile dysfunction: Okay to use sildenafil.  Will call in prescription.   Current medicines are reviewed at length with the patient today.  The patient concerns regarding his medicines were addressed.  The following changes have been made: As above  Labs/ tests ordered today include: Nuclear stress test No orders of the defined types were placed in this encounter.   Recommend 150 minutes/week of aerobic exercise Low fat, low carb, high fiber diet recommended  Disposition:   FU in 1 year   Signed, Larae Grooms, MD  01/09/2019 4:28 PM    [facility] [place],  [place], [place]  [address] Phone: [phone]; Fax: [phone]

## 2019-01-15 ENCOUNTER — Other Ambulatory Visit: Payer: Self-pay

## 2019-01-15 ENCOUNTER — Ambulatory Visit (HOSPITAL_COMMUNITY): Payer: 59 | Attending: Cardiovascular Disease

## 2019-01-15 DIAGNOSIS — E782 Mixed hyperlipidemia: Secondary | ICD-10-CM | POA: Diagnosis present

## 2019-01-15 DIAGNOSIS — I25118 Atherosclerotic heart disease of native coronary artery with other forms of angina pectoris: Secondary | ICD-10-CM

## 2019-01-15 DIAGNOSIS — I10 Essential (primary) hypertension: Secondary | ICD-10-CM | POA: Insufficient documentation

## 2019-01-15 LAB — MYOCARDIAL PERFUSION IMAGING
Estimated workload: 11.9 METS
Exercise duration (min): 11 min
Exercise duration (sec): 30 s
LV dias vol: 88 mL (ref 62–150)
LVSYSVOL: 36 mL
MPHR: 162 {beats}/min
Peak HR: 155 {beats}/min
Percent HR: 95 %
Rest HR: 69 {beats}/min
SDS: 0
SRS: 0
SSS: 0
TID: 0.89

## 2019-01-15 MED ORDER — SILDENAFIL CITRATE 20 MG PO TABS: ORAL_TABLET | ORAL | 0 refills | Status: DC

## 2019-01-15 MED ORDER — TECHNETIUM TC 99M TETROFOSMIN IV KIT
32.9000 | PACK | Freq: Once | INTRAVENOUS | Status: AC | PRN
  Administered 2019-01-15: 32.9 via INTRAVENOUS
  Filled 2019-01-15: qty 33

## 2019-01-15 MED ORDER — TECHNETIUM TC 99M TETROFOSMIN IV KIT
10.5000 | PACK | Freq: Once | INTRAVENOUS | Status: AC | PRN
  Administered 2019-01-15: 10.5 via INTRAVENOUS
  Filled 2019-01-15: qty 11

## 2020-01-08 ENCOUNTER — Other Ambulatory Visit: Payer: Self-pay | Admitting: Interventional Cardiology

## 2020-01-19 NOTE — Progress Notes (Signed)
Cardiology Office Note   Date:  01/20/2020   ID:  Eddie Garner, DOB 20-Feb-1961, MRN [id number]  PCP:  [facility], [place]    No chief complaint on file.  CAD  Wt Readings from Last 3 Encounters:  01/20/20 233 lb (105.7 kg)  01/15/19 241 lb (109.3 kg)  01/09/19 241 lb 9.6 oz (109.6 kg)       History of Present Illness: Eddie Garner is a 59 y.o. male  Who has had an LAD drug eluting stent in 2009.    ETT in 2014 was negative.He hadanother stress test for DOT [id number] which was a negative nuclear study.  Working on getting HbA1C down, was at 9, in the past.In 11/18, it was 8.3.  Since the last visit, he had shoulder, elbow and thyroid surgery.  No cardiac issues.  He was out of work.  He gained some weight back after that.    A1C in 2/20 was 8.8.  Since the last visit, he has remained active.    Denies : Chest pain. Dizziness. Leg edema. Nitroglycerin use. Orthopnea. Palpitations. Paroxysmal nocturnal dyspnea. Shortness of breath. Syncope.   He will be having shoulder surgery ( Dr. Tonita Cong), but will need hand surgery (Dr. Soundra Pilon) before then.  He does use the treadmill and elliptical.   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 He eats a fair amount of salads.     Past Medical History:  Diagnosis Date  . Angina pectoris (Eddie Garner)   . Coronary artery disease   . Diabetes (Eddie Garner)   . HTN (hypertension)   . Hypercholesteremia   . Hyperlipidemia   . Obesity   . Pain in joint   . Right thyroid nodule     Past Surgical History:  Procedure Laterality Date  . APPENDECTOMY    . CARDIAC CATHETERIZATION    . CORONARY ANGIOPLASTY     DES LAD 10/28/08 (Dr. Irish Lack, [facility])  . ELBOW SURGERY Left 07/09/2018   Dr. Caralyn Guile  . FOOT SURGERY    . HAND SURGERY Left 07/09/2018   Dr. Caralyn Guile   . SHOULDER SURGERY Right 03/05/2018   Dr. Veverly Fells.   . THYROID LOBECTOMY Right 11/19/2018  . THYROID LOBECTOMY Right  11/19/2018   Procedure: RIGHT THYROID LOBECTOMY;  Surgeon: Ralene Ok, MD;  Location: [place];  Service: General;  Laterality: Right;     Current Outpatient Medications  Medication Sig Dispense Refill  . clopidogrel (PLAVIX) 75 MG tablet TAKE 1 TABLET (75 MG TOTAL) BY MOUTH DAILY WITH BREAKFAST. Please schedule follow up appr for further refills 90 tablet 0  . dapagliflozin propanediol (FARXIGA) 10 MG TABS tablet Take 10 mg by mouth daily.     Marland Kitchen glucose blood test strip 1 each by Other route as needed for other. Use as instructed    . lisinopril (PRINIVIL,ZESTRIL) 10 MG tablet Take 1 tablet (10 mg total) by mouth daily. 90 tablet 3  . metFORMIN (GLUCOPHAGE) 500 MG tablet Take 500 mg by mouth daily. Evening.    . metFORMIN (GLUCOPHAGE-XR) 750 MG 24 hr tablet Take 750 mg by mouth daily.    . Multiple Vitamin (MULTIVITAMIN) capsule Take 1 capsule by mouth daily.    . nitroGLYCERIN (NITROSTAT) 0.4 MG SL tablet Place 0.4 mg under the tongue every 5 (five) minutes as needed for chest pain.     . Omega-3 Fatty Acids (FISH OIL) 1000 MG CAPS Take 1,000 mg by mouth  daily.     . sildenafil (REVATIO) 20 MG tablet Take 3-5 tablets prior to sexual activity 30 tablet 0  . simvastatin (ZOCOR) 20 MG tablet TAKE 1 TABLET BY MOUTH EVERYDAY AT BEDTIME 90 tablet 3  . sitaGLIPtin (JANUVIA) 100 MG tablet Take 100 mg by mouth daily.     No current facility-administered medications for this visit.    Allergies:   Patient has no known allergies.    Social History:  The patient  reports that he has never smoked. He has never used smokeless tobacco. He reports current alcohol use. He reports that he does not use drugs.   Family History:  The patient's family history includes Atrial fibrillation in his father; CVA in his paternal grandfather; Coronary artery disease in his maternal grandfather; Diabetes in his paternal grandmother; Stroke in his paternal grandfather.    ROS:  Please see the history of present  illness.   Otherwise, review of systems are positive for shoulder pain.   All other systems are reviewed and negative.    PHYSICAL EXAM: VS:  BP 138/88   Pulse 71   Ht 5\' 10"  (1.778 m)   Wt 233 lb (105.7 kg)   SpO2 98%   BMI 33.43 kg/m  , BMI Body mass index is 33.43 kg/m. GEN: Well nourished, well developed, in no acute distress  HEENT: normal  Neck: no JVD, carotid bruits, or masses Cardiac: RRR; no murmurs, rubs, or gallops,no edema  Respiratory:  clear to auscultation bilaterally, normal work of breathing GI: soft, nontender, nondistended, + BS MS: no deformity or atrophy  Skin: warm and dry, no rash Neuro:  Strength and sensation are intact Psych: euthymic mood, full affect   EKG:   The ekg ordered today demonstrates NSR, nonspecific ST changes   Recent Labs: No results found for requested labs within last 8760 hours.   Lipid Panel No results found for: CHOL, TRIG, HDL, CHOLHDL, VLDL, LDLCALC, LDLDIRECT   Other studies Reviewed: Additional studies/ records that were reviewed today with results demonstrating: LDL 75 in 2019.     ASSESSMENT AND PLAN:  1. 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  No need for stress test this year per his report.  OK for letter giving permission to drive which was given to the patient today. 2. DM: Regular exercise and healthy diet to try to bring down A1c. 3. Hyperlipidemia: LDL target of 70.  LDL 74 in 07/2019 from PMD.  Continue simvastatin.  Consider rosuvastatin if his LDL increases.  He has tolerated simvastatin for quite a while.  4. Erectile dysfunction: Called in sildenafil.  He had success with this medicine.   5. HTN: Continue lisinopril. Regular exercise and healthy diet recommended.   Current medicines are reviewed at length with the patient today.  The patient concerns regarding his medicines were addressed.  The following changes have been made:  No change  Labs/ tests ordered today include:  No orders  of the defined types were placed in this encounter.   Recommend 150 minutes/week of aerobic exercise Low fat, low carb, high fiber diet recommended  Disposition:   FU in 1 year   Signed, Larae Grooms, MD  01/20/2020 1:54 PM    [facility] [place], [place], [place]  [address] Phone: [phone]; Fax: [phone]

## 2020-01-20 ENCOUNTER — Encounter: Payer: Self-pay | Admitting: Interventional Cardiology

## 2020-01-20 ENCOUNTER — Other Ambulatory Visit: Payer: Self-pay

## 2020-01-20 ENCOUNTER — Ambulatory Visit (INDEPENDENT_AMBULATORY_CARE_PROVIDER_SITE_OTHER): Payer: No Typology Code available for payment source | Admitting: Interventional Cardiology

## 2020-01-20 VITALS — BP 138/88 | HR 71 | Ht 70.0 in | Wt 233.0 lb

## 2020-01-20 DIAGNOSIS — E782 Mixed hyperlipidemia: Secondary | ICD-10-CM | POA: Diagnosis not present

## 2020-01-20 DIAGNOSIS — I25118 Atherosclerotic heart disease of native coronary artery with other forms of angina pectoris: Secondary | ICD-10-CM

## 2020-01-20 DIAGNOSIS — N529 Male erectile dysfunction, unspecified: Secondary | ICD-10-CM | POA: Diagnosis not present

## 2020-01-20 DIAGNOSIS — I10 Essential (primary) hypertension: Secondary | ICD-10-CM

## 2020-01-20 MED ORDER — SILDENAFIL CITRATE 20 MG PO TABS: ORAL_TABLET | ORAL | 3 refills | Status: DC

## 2020-01-20 NOTE — Patient Instructions (Signed)

## 2020-02-04 ENCOUNTER — Ambulatory Visit: Payer: 59 | Admitting: Interventional Cardiology

## 2020-03-25 NOTE — Telephone Encounter (Signed)
Will have Dr. Irish Lack review this clearance when he is back in the office on Friday 5/28.

## 2020-03-31 NOTE — Telephone Encounter (Addendum)
We did not get this filled out when Dr. Irish Lack was in the office on 5/28. We will have it filled out when he is back in the office on 6/4. Patient aware.

## 2020-04-04 ENCOUNTER — Other Ambulatory Visit: Payer: Self-pay | Admitting: Interventional Cardiology

## 2020-04-07 ENCOUNTER — Ambulatory Visit: Payer: Self-pay | Admitting: Orthopedic Surgery

## 2020-04-07 NOTE — H&P (View-Only) (Signed)
Eddie Garner is an 59 y.o. male.   Chief Complaint: L shoulder pain HPI: Visit For: Follow up (to discuss left shoulder MRI) Location: left; shoulder Duration: 3 months Severity: pain level 8/10 Aggravating Factors: lying down aggravates Associated Symptoms: radiating down upper arm Work Related: yes; The patient was placed on restriction of no climbing. Patient also reports using a wheelbarrow yesterday having pain in the top of his trapezius and rhomboid major  Past Medical History:  Diagnosis Date  . Angina pectoris ([place])   . Coronary artery disease   . Diabetes ([place])   . HTN (hypertension)   . Hypercholesteremia   . Hyperlipidemia   . Obesity   . Pain in joint   . Right thyroid nodule     Past Surgical History:  Procedure Laterality Date  . APPENDECTOMY    . CARDIAC CATHETERIZATION    . CORONARY ANGIOPLASTY     DES LAD 10/28/08 (Dr. Irish Lack, [facility])  . ELBOW SURGERY Left 07/09/2018   Dr. Caralyn Guile  . FOOT SURGERY    . HAND SURGERY Left 07/09/2018   Dr. Caralyn Guile   . SHOULDER SURGERY Right 03/05/2018   Dr. Veverly Fells.   . THYROID LOBECTOMY Right 11/19/2018  . THYROID LOBECTOMY Right 11/19/2018   Procedure: RIGHT THYROID LOBECTOMY;  Surgeon: Ralene Ok, MD;  Location: [facility];  Service: General;  Laterality: Right;    Family History  Problem Relation Age of Onset  . Atrial fibrillation Father   . Coronary artery disease Maternal Grandfather   . Diabetes Paternal Grandmother   . CVA Paternal Grandfather   . Stroke Paternal Grandfather    Social History:  reports that he has never smoked. He has never used smokeless tobacco. He reports current alcohol use. He reports that he does not use drugs.  Allergies: No Known Allergies  (Not in a hospital admission)   No results found for this or any previous visit (from the past 48 hour(s)). No results found.  Review of Systems  Constitutional: Negative.   HENT: Negative.   Eyes: Negative.   Respiratory: Negative.    Cardiovascular: Negative.   Gastrointestinal: Negative.   Endocrine: Negative.   Genitourinary: Negative.   Musculoskeletal: Positive for arthralgias.  Skin: Negative.   Neurological: Negative.   Hematological: Negative.     There were no vitals taken for this visit. Physical Exam  Constitutional: He is oriented to person, place, and time. He appears well-developed and well-nourished.  HENT:  Head: Normocephalic.  Eyes: Pupils are equal, round, and reactive to light.  Cardiovascular: Normal rate.  Respiratory: Effort normal.  GI: Soft. Bowel sounds are normal.  Musculoskeletal:     Cervical back: Normal range of motion.     Comments: Patient is a 59 year old male.  Constitutional: General Appearance: healthy-appearing and NAD.  Psychiatric: Mood and Affect: normal mood and affect.  Cardiovascular System: Arterial Pulses Left: radial normal and brachial normal. Edema Left: none. Varicosities Right: no varicosities. Varicosities Left: no varicosities.  C-Spine/Neck: Active Range of Motion: flexion normal, extension normal, and no pain elicited on motion.  Shoulders: Inspection Left: no misalignment, atrophy, erythema, swelling, or scapular winging. Bony Palpation Left: no tenderness of the sternoclavicular joint, the coracoid process, the acromioclavicular joint, the bicipital groove, or the scapula. Soft Tissue Palpation Left: no tenderness of the infraspinatus, the teres minor, the subacromial bursa, the axilla, the glenohumeral joint region, the pectoralis major insertion, the sternocleidomastoid, the costochondral junction, the trapezius, the rhomboid, the latissimus dorsi, the serratus, the deltoid,  the levator scapulae, or the lateral cuff insertion and tenderness of the supraspinatus and the subdeltoid bursa. Active Range of Motion Left: limited. Special Tests Left: Speed's test negative and Neer's test positive. Stability Left: no laxity, sulcus sign negative, and anterior  apprehension test negative. Strength Left: abduction 5/5, adduction 5/5, flexion 5/5, and extension 5/5.  Skin: Left Upper Extremity: normal.  Neurological System: Biceps Reflex Left: normal (2). Brachioradialis Reflex Left: normal (2). Triceps Reflex Left: normal (2). Sensation on the Left: C5 normal, C6 normal, and C7 normal.  Patient does have some tenderness over the trapezius and rhomboid major. But extension lateral flexion does not produce radicular pain is no numbness and tingling Is tenderness over the anterior glenohumeral joint. Minimally over the biceps tendon.  Neurological: He is alert and oriented to person, place, and time.    MRI of the shoulder which was then subsequently discussed with the radiologist demonstrates a full-thickness tear of the anterior fibers of the supraspinatus at the footprint. No retraction or muscle atrophy. Severe long head biceps tendinosis within the groove moderate infraspinatus tendinosis  AC joint arthrosis. I discussed with the radiologist the anterior fluid around the subscapular recess he feels it is secondary to fluid and no injury to the subscapularis.  Assessment/Plan Impression:   Patient demonstrates left shoulder pain secondary to full-thickness small tear of the supraspinatus of the rotator cuff. Has bicipital tendinosis.   Plan:   Given the duration of his symptoms in the presence of a full tear we discussed the recommendation of a rotator cuff repair. This arthroscopically assisted with a subacromial decompression and mini open repair. He has had procedure on his right side which included a tenodesis that would not be necessary.   An extensive discussion concerning the pathology relevant anatomy and treatment options. After that discussion we mutually agreed to proceed with repair of the rotator cuff utilizing arthroscopic assistance if possible. The risks and benefits of that procedure were discussed including bleeding, infection,  suboptimal range of motion, deep venous thrombosis, pulmonary embolism, anesthetic complications etc. in addition we discussed the postoperative course to include approximately 4 weeks of passive range of motion followed by 4 weeks of active range of motion followed by 4-12 weeks of progressive strengthening exercises. In addition we discussed protective activities to reduce the risk of a reinjury including impingement activities with elbow above the shoulder as well as reaching and repetitive circular motion activities. The hospital stay will either be as a outpatient with a regional block versus overnight depending upon the extent of the procedure and any challenging health issues with a first postoperative visit 2 weeks following the surgery.     No history of MRSA or DVT stroke or heart attack. He is on Plavix as he does have a stent therefore require clearance.    Patient was given a prescription for an anti-inflammatory or instructed to take over-the-counter anti-inflammatory medications. Side effects were discussed including potential elevation of blood pressure, long-term effect on the kidneys and liver, ulcer risks and therefore the need for appropriate monitoring if taken continually. That would include regular blood chemistries by a primary care physician to evaluate kidney function as well as liver function and monitor for potential gastrointestinal effects. My preference is to utilize anti-inflammatories periodically and preemptively to reduce inflammation on a short-term basis. This would decrease the risks associated with long-term use of those medications.  In addition anti-inflammatory medications should be taken with a meal. They should not be taken if a  blood thinner is being used or the patient has a history of peptic ulcer disease.  Continue to work no climbing no lifting of the left arm.  We discussed probable 4 months until return to work I anticipate returning to his regular  duties.  Plan L shoulder scope, SAD, mini-open RCR  Cecilie Kicks, PA-C for Dr. Tonita Cong 04/07/2020, 3:46 PM

## 2020-04-07 NOTE — H&P (Signed)
Eddie Garner is an 59 y.o. male.   Chief Complaint: L shoulder pain HPI: Visit For: Follow up (to discuss left shoulder MRI) Location: left; shoulder Duration: 3 months Severity: pain level 8/10 Aggravating Factors: lying down aggravates Associated Symptoms: radiating down upper arm Work Related: yes; The patient was placed on restriction of no climbing. Patient also reports using a wheelbarrow yesterday having pain in the top of his trapezius and rhomboid major  Past Medical History:  Diagnosis Date  . Angina pectoris ([place])   . Coronary artery disease   . Diabetes ([place])   . HTN (hypertension)   . Hypercholesteremia   . Hyperlipidemia   . Obesity   . Pain in joint   . Right thyroid nodule     Past Surgical History:  Procedure Laterality Date  . APPENDECTOMY    . CARDIAC CATHETERIZATION    . CORONARY ANGIOPLASTY     DES LAD 10/28/08 (Dr. Irish Lack, [facility])  . ELBOW SURGERY Left 07/09/2018   Dr. Caralyn Guile  . FOOT SURGERY    . HAND SURGERY Left 07/09/2018   Dr. Caralyn Guile   . SHOULDER SURGERY Right 03/05/2018   Dr. Veverly Fells.   . THYROID LOBECTOMY Right 11/19/2018  . THYROID LOBECTOMY Right 11/19/2018   Procedure: RIGHT THYROID LOBECTOMY;  Surgeon: Ralene Ok, MD;  Location: [facility];  Service: General;  Laterality: Right;    Family History  Problem Relation Age of Onset  . Atrial fibrillation Father   . Coronary artery disease Maternal Grandfather   . Diabetes Paternal Grandmother   . CVA Paternal Grandfather   . Stroke Paternal Grandfather    Social History:  reports that he has never smoked. He has never used smokeless tobacco. He reports current alcohol use. He reports that he does not use drugs.  Allergies: No Known Allergies  (Not in a hospital admission)   No results found for this or any previous visit (from the past 48 hour(s)). No results found.  Review of Systems  Constitutional: Negative.   HENT: Negative.   Eyes: Negative.   Respiratory: Negative.    Cardiovascular: Negative.   Gastrointestinal: Negative.   Endocrine: Negative.   Genitourinary: Negative.   Musculoskeletal: Positive for arthralgias.  Skin: Negative.   Neurological: Negative.   Hematological: Negative.     There were no vitals taken for this visit. Physical Exam  Constitutional: He is oriented to person, place, and time. He appears well-developed and well-nourished.  HENT:  Head: Normocephalic.  Eyes: Pupils are equal, round, and reactive to light.  Cardiovascular: Normal rate.  Respiratory: Effort normal.  GI: Soft. Bowel sounds are normal.  Musculoskeletal:     Cervical back: Normal range of motion.     Comments: Patient is a 59 year old male.  Constitutional: General Appearance: healthy-appearing and NAD.  Psychiatric: Mood and Affect: normal mood and affect.  Cardiovascular System: Arterial Pulses Left: radial normal and brachial normal. Edema Left: none. Varicosities Right: no varicosities. Varicosities Left: no varicosities.  C-Spine/Neck: Active Range of Motion: flexion normal, extension normal, and no pain elicited on motion.  Shoulders: Inspection Left: no misalignment, atrophy, erythema, swelling, or scapular winging. Bony Palpation Left: no tenderness of the sternoclavicular joint, the coracoid process, the acromioclavicular joint, the bicipital groove, or the scapula. Soft Tissue Palpation Left: no tenderness of the infraspinatus, the teres minor, the subacromial bursa, the axilla, the glenohumeral joint region, the pectoralis major insertion, the sternocleidomastoid, the costochondral junction, the trapezius, the rhomboid, the latissimus dorsi, the serratus, the deltoid,  the levator scapulae, or the lateral cuff insertion and tenderness of the supraspinatus and the subdeltoid bursa. Active Range of Motion Left: limited. Special Tests Left: Speed's test negative and Neer's test positive. Stability Left: no laxity, sulcus sign negative, and anterior  apprehension test negative. Strength Left: abduction 5/5, adduction 5/5, flexion 5/5, and extension 5/5.  Skin: Left Upper Extremity: normal.  Neurological System: Biceps Reflex Left: normal (2). Brachioradialis Reflex Left: normal (2). Triceps Reflex Left: normal (2). Sensation on the Left: C5 normal, C6 normal, and C7 normal.  Patient does have some tenderness over the trapezius and rhomboid major. But extension lateral flexion does not produce radicular pain is no numbness and tingling Is tenderness over the anterior glenohumeral joint. Minimally over the biceps tendon.  Neurological: He is alert and oriented to person, place, and time.    MRI of the shoulder which was then subsequently discussed with the radiologist demonstrates a full-thickness tear of the anterior fibers of the supraspinatus at the footprint. No retraction or muscle atrophy. Severe long head biceps tendinosis within the groove moderate infraspinatus tendinosis  AC joint arthrosis. I discussed with the radiologist the anterior fluid around the subscapular recess he feels it is secondary to fluid and no injury to the subscapularis.  Assessment/Plan Impression:   Patient demonstrates left shoulder pain secondary to full-thickness small tear of the supraspinatus of the rotator cuff. Has bicipital tendinosis.   Plan:   Given the duration of his symptoms in the presence of a full tear we discussed the recommendation of a rotator cuff repair. This arthroscopically assisted with a subacromial decompression and mini open repair. He has had procedure on his right side which included a tenodesis that would not be necessary.   An extensive discussion concerning the pathology relevant anatomy and treatment options. After that discussion we mutually agreed to proceed with repair of the rotator cuff utilizing arthroscopic assistance if possible. The risks and benefits of that procedure were discussed including bleeding, infection,  suboptimal range of motion, deep venous thrombosis, pulmonary embolism, anesthetic complications etc. in addition we discussed the postoperative course to include approximately 4 weeks of passive range of motion followed by 4 weeks of active range of motion followed by 4-12 weeks of progressive strengthening exercises. In addition we discussed protective activities to reduce the risk of a reinjury including impingement activities with elbow above the shoulder as well as reaching and repetitive circular motion activities. The hospital stay will either be as a outpatient with a regional block versus overnight depending upon the extent of the procedure and any challenging health issues with a first postoperative visit 2 weeks following the surgery.     No history of MRSA or DVT stroke or heart attack. He is on Plavix as he does have a stent therefore require clearance.    Patient was given a prescription for an anti-inflammatory or instructed to take over-the-counter anti-inflammatory medications. Side effects were discussed including potential elevation of blood pressure, long-term effect on the kidneys and liver, ulcer risks and therefore the need for appropriate monitoring if taken continually. That would include regular blood chemistries by a primary care physician to evaluate kidney function as well as liver function and monitor for potential gastrointestinal effects. My preference is to utilize anti-inflammatories periodically and preemptively to reduce inflammation on a short-term basis. This would decrease the risks associated with long-term use of those medications.  In addition anti-inflammatory medications should be taken with a meal. They should not be taken if a  blood thinner is being used or the patient has a history of peptic ulcer disease.  Continue to work no climbing no lifting of the left arm.  We discussed probable 4 months until return to work I anticipate returning to his regular  duties.  Plan L shoulder scope, SAD, mini-open RCR  Cecilie Kicks, PA-C for Dr. Tonita Cong 04/07/2020, 3:46 PM

## 2020-04-07 NOTE — Progress Notes (Signed)
Please place surgery orders in Epic. Pt is scheduled for his PAT appt on 04-09-20.. Also, please fax cardiac clearance to [facility] at [phone]. Thank you

## 2020-04-07 NOTE — Progress Notes (Addendum)
PCP - [place]  Cardiologist - Larae Grooms, MD f/u in 1 year  No back stimulator  PPM/ICD -  Device Orders -  Rep Notified -   Chest x-ray -  EKG - 01-20-20 Stress Test -  ECHO -  Cardiac Cath -   Sleep Study -  CPAP -   Fasting Blood Sugar - 180  Checks Blood Sugar __1___ times a day  Blood Thinner Instructions: Plavix 5 mg. Pt's last dose will be on 04-10-20 Aspirin Instructions:  ERAS Protcol - PRE-SURGERY Ensure or G2-   COVID TEST-  COVID VACCINATION X 2  Anesthesia review:   Patient denies shortness of breath, fever, cough and chest pain at PAT appointment   All instructions explained to the patient, with a verbal understanding of the material. Patient agrees to go over the instructions while at home for a better understanding. Patient also instructed to self quarantine after being tested for COVID-19. The opportunity to ask questions was provided.

## 2020-04-07 NOTE — Patient Instructions (Addendum)
DUE TO COVID-19 ONLY ONE VISITOR IS ALLOWED TO COME WITH YOU AND STAY IN THE WAITING ROOM ONLY DURING PRE OP AND PROCEDURE DAY OF SURGERY. THE 1 VISITOR MAY VISIT WITH YOU AFTER SURGERY IN YOUR PRIVATE ROOM DURING VISITING HOURS ONLY!  YOU NEED TO HAVE A COVID 19 TEST ON 04-14-20 @ 9:55 AM, (PT OUT OF TOWN AND UNABLE TO COME FOR COVID n 04-13-20 ). THIS TEST MUST BE DONE BEFORE SURGERY, COME  [address], [place] , [address].  ([place]) ONCE YOUR COVID TEST IS COMPLETED, PLEASE BEGIN THE QUARANTINE INSTRUCTIONS AS OUTLINED IN YOUR HANDOUT.                DEMARIE HYNEMAN  04/07/2020   Your procedure is scheduled on: 04-16-20   Report to [facility] Main  Entrance    Report to Admitting at 5:30 AM     Call this number if you have problems the morning of surgery [phone]    Remember: NO SOLID FOOD AFTER MIDNIGHT THE NIGHT PRIOR TO SURGERY. NOTHING BY MOUTH EXCEPT CLEAR LIQUIDS UNTIL 4:30 AM . PLEASE FINISH g2 DRINK PER SURGEON ORDER  WHICH NEEDS TO BE COMPLETED AT 4:30 AM .   CLEAR LIQUID DIET   Foods Allowed                                                                     Foods Excluded  Coffee and tea, regular and decaf                             liquids that you cannot  Plain Jell-O any favor except red or purple                                           see through such as: Fruit ices (not with fruit pulp)                                     milk, soups, orange juice  Iced Popsicles                                    All solid food Carbonated beverages, regular and diet                                    Cranberry, grape and apple juices Sports drinks like Gatorade Lightly seasoned clear broth or consume(fat free) Sugar, honey syrup   _____________________________________________________________________     Take these medicines the morning of surgery with A SIP OF WATER: None   BRUSH YOUR TEETH MORNING OF SURGERY AND RINSE YOUR MOUTH  OUT, NO CHEWING GUM CANDY OR MINTS.     DO NOT TAKE ANY DIABETIC MEDICATIONS DAY OF YOUR SURGERY  You may not have any metal on your body including hair pins and              piercings     Do not wear jewelry, cologne, lotions, powders or deodorant                    Men may shave face and neck.   Do not bring valuables to the hospital. [place].  Contacts, dentures or bridgework may not be worn into surgery.       Patients discharged the day of surgery will not be allowed to drive home. IF YOU ARE HAVING SURGERY AND GOING HOME THE SAME DAY, YOU MUST HAVE AN ADULT TO DRIVE YOU HOME AND BE WITH YOU FOR 24 HOURS. YOU MAY GO HOME BY TAXI OR UBER OR ORTHERWISE, BUT AN ADULT MUST ACCOMPANY YOU HOME AND STAY WITH YOU FOR 24 HOURS.  Name and phone number of your driver:Carolyn Hidalgo [phone]  Special Instructions: N/A              Please read over the following fact sheets you were given: _____________________________________________________________________ How to Manage Your Diabetes Before and After Surgery  Why is it important to control my blood sugar before and after surgery? . Improving blood sugar levels before and after surgery helps healing and can limit problems. . A way of improving blood sugar control is eating a healthy diet by: o  Eating less sugar and carbohydrates o  Increasing activity/exercise o  Talking with your doctor about reaching your blood sugar goals . High blood sugars (greater than 180 mg/dL) can raise your risk of infections and slow your recovery, so you will need to focus on controlling your diabetes during the weeks before surgery. . Make sure that the doctor who takes care of your diabetes knows about your planned surgery including the date and location.  How do I manage my blood sugar before surgery? . Check your blood sugar at least 4 times a day, starting 2 days  before surgery, to make sure that the level is not too high or low. o Check your blood sugar the morning of your surgery when you wake up and every 2 hours until you get to the Short Stay unit. . If your blood sugar is less than 70 mg/dL, you will need to treat for low blood sugar: o Do not take insulin. o Treat a low blood sugar (less than 70 mg/dL) with  cup of clear juice (cranberry or apple), 4 glucose tablets, OR glucose gel. o Recheck blood sugar in 15 minutes after treatment (to make sure it is greater than 70 mg/dL). If your blood sugar is not greater than 70 mg/dL on recheck, call [phone] for further instructions. . Report your blood sugar to the short stay nurse when you get to Short Stay.  . If you are admitted to the hospital after surgery: o Your blood sugar will be checked by the staff and you will probably be given insulin after surgery (instead of oral diabetes medicines) to make sure you have good blood sugar levels. o The goal for blood sugar control after surgery is 80-180 mg/dL.   WHAT DO I DO ABOUT MY DIABETES MEDICATION?  Marland Kitchen Do not take oral diabetes medicines (pills) the morning of surgery.  . THE DAY BEFORE SURGERY, take only your  prescribed Metformin.  DO NOT take your [place].         Reviewed and Endorsed by [facility] Patient Education Committee, August 2015  [facility]- Preparing for Total Shoulder Arthroplasty    Before surgery, you can play an important role. Because skin is not sterile, your skin needs to be as free of germs as possible. You can reduce the number of germs on your skin by using the following products. . Benzoyl Peroxide Gel o Reduces the number of germs present on the skin o Applied twice a day to shoulder area starting two days before surgery    ==================================================================  Please follow these instructions carefully:  BENZOYL PEROXIDE 5% GEL  Please do not use if you have an allergy to  benzoyl peroxide.   If your skin becomes reddened/irritated stop using the benzoyl peroxide.  Starting two days before surgery, apply as follows: 1. Apply benzoyl peroxide in the morning and at night. Apply after taking a shower. If you are not taking a shower clean entire shoulder front, back, and side along with the armpit with a clean wet washcloth.  2. Place a quarter-sized dollop on your shoulder and rub in thoroughly, making sure to cover the front, back, and side of your shoulder, along with the armpit.   2 days before ____ AM   ____ PM              1 day before ____ AM   ____ PM                         3. Do this twice a day for two days.  (Last application is the night before surgery, AFTER using the CHG soap as described below).  4. Do NOT apply benzoyl peroxide gel on the day of surgery.           [facility] - Preparing for Surgery Before surgery, you can play an important role.  Because skin is not sterile, your skin needs to be as free of germs as possible.  You can reduce the number of germs on your skin by washing with CHG (chlorahexidine gluconate) soap before surgery.  CHG is an antiseptic cleaner which kills germs and bonds with the skin to continue killing germs even after washing. Please DO NOT use if you have an allergy to CHG or antibacterial soaps.  If your skin becomes reddened/irritated stop using the CHG and inform your nurse when you arrive at Short Stay. Do not shave (including legs and underarms) for at least 48 hours prior to the first CHG shower.  You may shave your face/neck. Please follow these instructions carefully:  1.  Shower with CHG Soap the night before surgery and the  morning of Surgery.  2.  If you choose to wash your hair, wash your hair first as usual with your  normal  shampoo.  3.  After you shampoo, rinse your hair and body thoroughly to remove the  shampoo.                           4.  Use CHG as you would any other liquid soap.  You can apply  chg directly  to the skin and wash                       Gently with a scrungie or clean washcloth.  5.  Apply the CHG Soap  to your body ONLY FROM THE NECK DOWN.   Do not use on face/ open                           Wound or open sores. Avoid contact with eyes, ears mouth and genitals (private parts).                       Wash face,  Genitals (private parts) with your normal soap.             6.  Wash thoroughly, paying special attention to the area where your surgery  will be performed.  7.  Thoroughly rinse your body with warm water from the neck down.  8.  DO NOT shower/wash with your normal soap after using and rinsing off  the CHG Soap.                9.  Pat yourself dry with a clean towel.            10.  Wear clean pajamas.            11.  Place clean sheets on your bed the night of your first shower and do not  sleep with pets. Day of Surgery : Do not apply any lotions/deodorants the morning of surgery.  Please wear clean clothes to the hospital/surgery center.  FAILURE TO FOLLOW THESE INSTRUCTIONS MAY RESULT IN THE CANCELLATION OF YOUR SURGERY PATIENT SIGNATURE_________________________________  NURSE SIGNATURE__________________________________  ________________________________________________________________________

## 2020-04-09 ENCOUNTER — Encounter (HOSPITAL_COMMUNITY): Payer: Self-pay

## 2020-04-09 ENCOUNTER — Encounter (HOSPITAL_COMMUNITY)
Admission: RE | Admit: 2020-04-09 | Discharge: 2020-04-09 | Disposition: A | Discharge status: Home / Self Care | Payer: 59 | Source: Ambulatory Visit | Attending: Specialist | Admitting: Specialist

## 2020-04-09 ENCOUNTER — Other Ambulatory Visit: Payer: Self-pay

## 2020-04-09 NOTE — Progress Notes (Signed)
COVID test scheduled for 04-14-20 in lieu of 04-13-20 as patient reported that he would be out of town, and would not be available until 04-14-20 for testing. Pt made aware that if Covid's results are not back by day of surgery, it could result in the cancellation of the procedure..Pt vebalized understanding.    In addition, pt declined verbal instructions for the day of the procedure. Stated that he would read 'written instructions and call nurse back with any questions that he may have."

## 2020-04-14 ENCOUNTER — Encounter (HOSPITAL_COMMUNITY)
Admission: RE | Admit: 2020-04-14 | Discharge: 2020-04-14 | Disposition: A | Discharge status: Home / Self Care | Payer: 59 | Source: Ambulatory Visit | Attending: Specialist | Admitting: Specialist

## 2020-04-14 ENCOUNTER — Other Ambulatory Visit (HOSPITAL_COMMUNITY)
Admission: RE | Admit: 2020-04-14 | Discharge: 2020-04-14 | Disposition: A | Discharge status: Home / Self Care | Payer: 59 | Source: Ambulatory Visit | Attending: Specialist | Admitting: Specialist

## 2020-04-14 ENCOUNTER — Other Ambulatory Visit: Payer: Self-pay

## 2020-04-14 DIAGNOSIS — Z7901 Long term (current) use of anticoagulants: Secondary | ICD-10-CM | POA: Insufficient documentation

## 2020-04-14 DIAGNOSIS — M75102 Unspecified rotator cuff tear or rupture of left shoulder, not specified as traumatic: Secondary | ICD-10-CM | POA: Insufficient documentation

## 2020-04-14 DIAGNOSIS — Z7984 Long term (current) use of oral hypoglycemic drugs: Secondary | ICD-10-CM | POA: Insufficient documentation

## 2020-04-14 DIAGNOSIS — Z7902 Long term (current) use of antithrombotics/antiplatelets: Secondary | ICD-10-CM | POA: Insufficient documentation

## 2020-04-14 DIAGNOSIS — Z20822 Contact with and (suspected) exposure to covid-19: Secondary | ICD-10-CM | POA: Diagnosis not present

## 2020-04-14 DIAGNOSIS — E7800 Pure hypercholesterolemia, unspecified: Secondary | ICD-10-CM | POA: Diagnosis not present

## 2020-04-14 DIAGNOSIS — I2510 Atherosclerotic heart disease of native coronary artery without angina pectoris: Secondary | ICD-10-CM | POA: Insufficient documentation

## 2020-04-14 DIAGNOSIS — Z79899 Other long term (current) drug therapy: Secondary | ICD-10-CM | POA: Diagnosis not present

## 2020-04-14 DIAGNOSIS — E785 Hyperlipidemia, unspecified: Secondary | ICD-10-CM | POA: Insufficient documentation

## 2020-04-14 DIAGNOSIS — I10 Essential (primary) hypertension: Secondary | ICD-10-CM | POA: Insufficient documentation

## 2020-04-14 DIAGNOSIS — Z01812 Encounter for preprocedural laboratory examination: Secondary | ICD-10-CM | POA: Diagnosis present

## 2020-04-14 DIAGNOSIS — E118 Type 2 diabetes mellitus with unspecified complications: Secondary | ICD-10-CM | POA: Diagnosis not present

## 2020-04-14 LAB — BASIC METABOLIC PANEL
Anion gap: 10 (ref 5–15)
BUN: 23 mg/dL — ABNORMAL HIGH (ref 6–20)
CO2: 24 mmol/L (ref 22–32)
Calcium: 9 mg/dL (ref 8.9–10.3)
Chloride: 107 mmol/L (ref 98–111)
Creatinine, Ser: 0.8 mg/dL (ref 0.61–1.24)
GFR calc Af Amer: >60 mL/min (ref >60)
GFR calc non Af Amer: >60 mL/min (ref >60)
Glucose, Bld: 213 mg/dL — ABNORMAL HIGH (ref 70–99)
Potassium: 4 mmol/L (ref 3.5–5.1)
Sodium: 141 mmol/L (ref 135–145)

## 2020-04-14 LAB — GLUCOSE, CAPILLARY: Glucose-Capillary: 208 mg/dL — ABNORMAL HIGH (ref 70–99)

## 2020-04-14 LAB — CBC
HCT: 42 % (ref 39.0–52.0)
Hemoglobin: 14 g/dL (ref 13.0–17.0)
MCH: 30.9 pg (ref 26.0–34.0)
MCHC: 33.3 g/dL (ref 30.0–36.0)
MCV: 92.7 fL (ref 80.0–100.0)
Platelets: 226 10*3/uL (ref 150–400)
RBC: 4.53 MIL/uL (ref 4.22–5.81)
RDW: 12.7 % (ref 11.5–15.5)
WBC: 6.2 10*3/uL (ref 4.0–10.5)
nRBC: 0 % (ref 0.0–0.2)

## 2020-04-14 LAB — HEMOGLOBIN A1C
Hgb A1c MFr Bld: 8.6 % — ABNORMAL HIGH (ref 4.8–5.6)
Mean Plasma Glucose: 200.12 mg/dL

## 2020-04-14 LAB — SARS CORONAVIRUS 2 (TAT 6-24 HRS): SARS Coronavirus 2: NEGATIVE

## 2020-04-15 NOTE — Progress Notes (Signed)
Anesthesia Chart Review   Case: [id number] Date/Time: 04/16/20 0715   Procedure: SHOULDER ARTHROSCOPY WITH MINI OPEN ROTATOR CUFF REPAIR AND SUBACROMIAL DECOMPRESSION (Left ) - 90 MINS   Anesthesia type: General   Pre-op diagnosis: Left shoulder rotator cufff tear   Location: [facility] ROOM 08 / [facility]   Surgeons: Susa Day, MD      DISCUSSION:59 y.o. never smoker with h/o HTN, HLD, DM II, CAD (stent 10/28/2008), left shoulder rotator cuff tear scheduled for above procedure with Dr. Susa Day.   Pt last seen by cardiology 01/20/2020.  Per OV note pt stable with 1 year follow up recommended.  Clearance on chart which states pt is cleared for surgery with the following recommendations, "Ok to hold clopidogrel 5 days prior to surgery"  Pt seen by PCP for preoperative evaluation 01/23/2020.  Per OV note, " I think he should be fine for surgery, but I am going to wait until I see his A1C."  A1C 8.8 01/22/2020 which is an improvement from 9.4.  A1C 8.6 when repeated at PAT visit 04/14/2020.  Dr. Tonita Cong made aware.    Anticipate pt can proceed with planned procedure barring acute status change.   VS: BP (!) 149/85    Pulse 63    Temp 36.6 C (Oral)    Resp 16    Ht 5\' 10"  (1.778 m)    Wt 104.3 kg    SpO2 97%    BMI 33.00 kg/m   PROVIDERS: Medicine, [facility], MD is Cardiologist  LABS: Labs reviewed: Acceptable for surgery. (all labs ordered are listed, but only abnormal results are displayed)  Labs Reviewed  BASIC METABOLIC PANEL - Abnormal; Notable for the following components:      Result Value   Glucose, Bld 213 (*)    BUN 23 (*)    All other components within normal limits  GLUCOSE, CAPILLARY - Abnormal; Notable for the following components:   Glucose-Capillary 208 (*)    All other components within normal limits  HEMOGLOBIN A1C - Abnormal; Notable for the following components:   Hgb A1c MFr Bld 8.6 (*)    All other components within normal  limits  CBC     IMAGES:   EKG: 01/20/2020 Rate 71 bpm NSR  CV: Myocardial Perfusion 01/15/2019  Nuclear stress EF: 59%. The left ventricular ejection fraction is normal (55-65%).  The patient walked for a total of 11 minutes and 30 seconds on a standard Bruce protocol treadmill test. He achieved a peak heart rate of 155 which is 95% predicted maximal heart rate.  At peak exercise there were no ST or T wave changes.  Blood pressure demonstrated a hypertensive response to exercise.  This is a low risk study.  The study is normal.   Past Medical History:  Diagnosis Date   Angina pectoris ([place])    Coronary artery disease    Diabetes ([facility])    HTN (hypertension)    Hypercholesteremia    Hyperlipidemia    Obesity    Pain in joint    Right thyroid nodule     Past Surgical History:  Procedure Laterality Date   APPENDECTOMY     CARDIAC CATHETERIZATION     CORONARY ANGIOPLASTY     DES LAD 10/28/08 (Dr. Irish Lack, [facility])   ELBOW SURGERY Left 07/09/2018   Dr. Caralyn Guile   FOOT SURGERY     HAND SURGERY Left 07/09/2018   Dr. Caralyn Guile    SHOULDER SURGERY Right  03/05/2018   Dr. Veverly Fells.    THYROID LOBECTOMY Right 11/19/2018   THYROID LOBECTOMY Right 11/19/2018   Procedure: RIGHT THYROID LOBECTOMY;  Surgeon: Ralene Ok, MD;  Location: [facility];  Service: General;  Laterality: Right;    MEDICATIONS:  Ascorbic Acid (VITAMIN C) 1000 MG tablet   clopidogrel (PLAVIX) 75 MG tablet   dapagliflozin propanediol (FARXIGA) 10 MG TABS tablet   glucose blood test strip   lisinopril (PRINIVIL,ZESTRIL) 10 MG tablet   metFORMIN (GLUCOPHAGE-XR) 500 MG 24 hr tablet   metFORMIN (GLUCOPHAGE-XR) 750 MG 24 hr tablet   nitroGLYCERIN (NITROSTAT) 0.4 MG SL tablet   Omega-3 Fatty Acids (FISH OIL) 1000 MG CAPS   sildenafil (REVATIO) 20 MG tablet   simvastatin (ZOCOR) 20 MG tablet   sitaGLIPtin (JANUVIA) 100 MG tablet   No current facility-administered medications  for this encounter.    Maia Plan [facility] Pre-Surgical Testing [phone] 04/15/20  11:11 AM

## 2020-04-15 NOTE — Anesthesia Preprocedure Evaluation (Addendum)
Anesthesia Evaluation  Patient identified by MRN, date of birth, ID band Patient awake    Reviewed: Allergy & Precautions, NPO status , Patient's Chart, lab work & pertinent test results  History of Anesthesia Complications Negative for: history of anesthetic complications  Airway Mallampati: II  TM Distance: >3 FB Neck ROM: Full    Dental  (+) Teeth Intact   Pulmonary neg pulmonary ROS,    Pulmonary exam normal        Cardiovascular hypertension, + CAD and + Cardiac Stents  Normal cardiovascular exam     Neuro/Psych negative neurological ROS  negative psych ROS   GI/Hepatic negative GI ROS, Neg liver ROS,   Endo/Other  diabetes, Type 2, Oral Hypoglycemic Agents  Renal/GU negative Renal ROS  negative genitourinary   Musculoskeletal negative musculoskeletal ROS (+)   Abdominal   Peds  Hematology Plavix   Anesthesia Other Findings  HTN, HLD, DM II, CAD s/p PCI (2009)  Pt last seen by cardiology 01/20/2020.  Per OV note pt stable with 1 year follow up recommended.  Clearance on chart which states pt is cleared for surgery with the following recommendations, "Ok to hold clopidogrel 5 days prior to surgery"  MPS 01/15/19: EF 59%, normal, low risk study  Reproductive/Obstetrics                          Anesthesia Physical Anesthesia Plan  ASA: III  Anesthesia Plan: General   Post-op Pain Management: GA combined w/ Regional for post-op pain   Induction: Intravenous  PONV Risk Score and Plan: 2 and Ondansetron, Dexamethasone, Treatment may vary due to age or medical condition and Midazolam  Airway Management Planned: Oral ETT  Additional Equipment: None  Intra-op Plan:   Post-operative Plan: Extubation in OR  Informed Consent: I have reviewed the patients History and Physical, chart, labs and discussed the procedure including the risks, benefits and alternatives for the proposed  anesthesia with the patient or authorized representative who has indicated his/her understanding and acceptance.     Dental advisory given  Plan Discussed with:   Anesthesia Plan Comments: (See PAT note 04/14/2020, Konrad Felix, PA-C)      Anesthesia Quick Evaluation

## 2020-04-16 ENCOUNTER — Observation Stay (HOSPITAL_COMMUNITY)
Admission: RE | Admit: 2020-04-16 | Discharge: 2020-04-17 | Disposition: A | Discharge status: Home / Self Care | Payer: No Typology Code available for payment source | Source: Ambulatory Visit | Attending: Specialist | Admitting: Specialist

## 2020-04-16 ENCOUNTER — Encounter (HOSPITAL_COMMUNITY): Payer: Self-pay | Admitting: Specialist

## 2020-04-16 ENCOUNTER — Other Ambulatory Visit: Payer: Self-pay

## 2020-04-16 ENCOUNTER — Ambulatory Visit (HOSPITAL_COMMUNITY): Payer: No Typology Code available for payment source | Admitting: Physician Assistant

## 2020-04-16 ENCOUNTER — Ambulatory Visit (HOSPITAL_COMMUNITY): Payer: No Typology Code available for payment source | Admitting: Anesthesiology

## 2020-04-16 ENCOUNTER — Encounter (HOSPITAL_COMMUNITY)
Admission: RE | Disposition: A | Discharge status: Home / Self Care | Payer: Self-pay | Source: Ambulatory Visit | Attending: Specialist

## 2020-04-16 DIAGNOSIS — Z6833 Body mass index (BMI) 33.0-33.9, adult: Secondary | ICD-10-CM | POA: Insufficient documentation

## 2020-04-16 DIAGNOSIS — E785 Hyperlipidemia, unspecified: Secondary | ICD-10-CM | POA: Diagnosis not present

## 2020-04-16 DIAGNOSIS — Z79899 Other long term (current) drug therapy: Secondary | ICD-10-CM | POA: Insufficient documentation

## 2020-04-16 DIAGNOSIS — I2510 Atherosclerotic heart disease of native coronary artery without angina pectoris: Secondary | ICD-10-CM | POA: Insufficient documentation

## 2020-04-16 DIAGNOSIS — E1165 Type 2 diabetes mellitus with hyperglycemia: Secondary | ICD-10-CM | POA: Insufficient documentation

## 2020-04-16 DIAGNOSIS — M7542 Impingement syndrome of left shoulder: Secondary | ICD-10-CM | POA: Diagnosis not present

## 2020-04-16 DIAGNOSIS — E118 Type 2 diabetes mellitus with unspecified complications: Secondary | ICD-10-CM | POA: Diagnosis present

## 2020-04-16 DIAGNOSIS — Z7902 Long term (current) use of antithrombotics/antiplatelets: Secondary | ICD-10-CM | POA: Insufficient documentation

## 2020-04-16 DIAGNOSIS — E7800 Pure hypercholesterolemia, unspecified: Secondary | ICD-10-CM | POA: Insufficient documentation

## 2020-04-16 DIAGNOSIS — Z955 Presence of coronary angioplasty implant and graft: Secondary | ICD-10-CM | POA: Insufficient documentation

## 2020-04-16 DIAGNOSIS — S46012A Strain of muscle(s) and tendon(s) of the rotator cuff of left shoulder, initial encounter: Principal | ICD-10-CM | POA: Insufficient documentation

## 2020-04-16 DIAGNOSIS — Z833 Family history of diabetes mellitus: Secondary | ICD-10-CM | POA: Insufficient documentation

## 2020-04-16 DIAGNOSIS — Z8711 Personal history of peptic ulcer disease: Secondary | ICD-10-CM | POA: Insufficient documentation

## 2020-04-16 DIAGNOSIS — Z8249 Family history of ischemic heart disease and other diseases of the circulatory system: Secondary | ICD-10-CM | POA: Insufficient documentation

## 2020-04-16 DIAGNOSIS — X58XXXA Exposure to other specified factors, initial encounter: Secondary | ICD-10-CM | POA: Diagnosis not present

## 2020-04-16 DIAGNOSIS — E669 Obesity, unspecified: Secondary | ICD-10-CM | POA: Insufficient documentation

## 2020-04-16 DIAGNOSIS — M75120 Complete rotator cuff tear or rupture of unspecified shoulder, not specified as traumatic: Secondary | ICD-10-CM | POA: Diagnosis present

## 2020-04-16 DIAGNOSIS — I10 Essential (primary) hypertension: Secondary | ICD-10-CM | POA: Diagnosis not present

## 2020-04-16 DIAGNOSIS — Z7984 Long term (current) use of oral hypoglycemic drugs: Secondary | ICD-10-CM | POA: Insufficient documentation

## 2020-04-16 HISTORY — PX: SHOULDER ARTHROSCOPY WITH ROTATOR CUFF REPAIR AND SUBACROMIAL DECOMPRESSION: SHX5686

## 2020-04-16 LAB — GLUCOSE, CAPILLARY
Glucose-Capillary: 181 mg/dL — ABNORMAL HIGH (ref 70–99)
Glucose-Capillary: 193 mg/dL — ABNORMAL HIGH (ref 70–99)
Glucose-Capillary: 161 mg/dL — ABNORMAL HIGH (ref 70–99)
Glucose-Capillary: 118 mg/dL — ABNORMAL HIGH (ref 70–99)

## 2020-04-16 SURGERY — SHOULDER ARTHROSCOPY WITH ROTATOR CUFF REPAIR AND SUBACROMIAL DECOMPRESSION
Anesthesia: General | Site: Shoulder | Laterality: Left

## 2020-04-16 MED ORDER — PHENYLEPHRINE HCL (PRESSORS) 10 MG/ML IV SOLN
INTRAVENOUS | Status: AC
  Filled 2020-04-16: qty 1

## 2020-04-16 MED ORDER — METOCLOPRAMIDE HCL 5 MG/ML IJ SOLN: 5.0000 mg | Freq: Three times a day (TID) | INTRAMUSCULAR | Status: DC | PRN

## 2020-04-16 MED ORDER — MIDAZOLAM HCL 2 MG/2ML IJ SOLN
INTRAMUSCULAR | Status: AC
  Filled 2020-04-16: qty 2

## 2020-04-16 MED ORDER — ENOXAPARIN SODIUM 40 MG/0.4ML ~~LOC~~ SOLN: 40.0000 mg | SUBCUTANEOUS | Status: DC

## 2020-04-16 MED ORDER — BUPIVACAINE-EPINEPHRINE (PF) 0.5% -1:200000 IJ SOLN
INTRAMUSCULAR | Status: AC
  Filled 2020-04-16: qty 30

## 2020-04-16 MED ORDER — LACTATED RINGERS IV SOLN
INTRAVENOUS | Status: DC
  Administered 2020-04-16: 1000 mL via INTRAVENOUS

## 2020-04-16 MED ORDER — LACTATED RINGERS IR SOLN
Status: DC | PRN
  Administered 2020-04-16: 3000 mL

## 2020-04-16 MED ORDER — PHENOL 1.4 % MT LIQD: 1.0000 | OROMUCOSAL | Status: DC | PRN

## 2020-04-16 MED ORDER — ROCURONIUM BROMIDE 10 MG/ML (PF) SYRINGE
PREFILLED_SYRINGE | INTRAVENOUS | Status: DC | PRN
  Administered 2020-04-16: 70 mg via INTRAVENOUS

## 2020-04-16 MED ORDER — DOCUSATE SODIUM 100 MG PO CAPS
100.0000 mg | ORAL_CAPSULE | Freq: Two times a day (BID) | ORAL | Status: DC
  Administered 2020-04-16 (×2): 100 mg via ORAL
  Filled 2020-04-16 (×2): qty 1

## 2020-04-16 MED ORDER — LISINOPRIL 10 MG PO TABS
10.0000 mg | ORAL_TABLET | Freq: Every day | ORAL | Status: DC
  Administered 2020-04-16: 10 mg via ORAL
  Filled 2020-04-16: qty 1

## 2020-04-16 MED ORDER — BUPIVACAINE HCL (PF) 0.5 % IJ SOLN
INTRAMUSCULAR | Status: DC | PRN
  Administered 2020-04-16: 15 mL via PERINEURAL

## 2020-04-16 MED ORDER — ASCORBIC ACID 500 MG PO TABS
1000.0000 mg | ORAL_TABLET | Freq: Every day | ORAL | Status: DC
  Administered 2020-04-16: 1000 mg via ORAL
  Filled 2020-04-16: qty 2

## 2020-04-16 MED ORDER — OXYCODONE HCL 5 MG PO TABS: 10.0000 mg | ORAL_TABLET | ORAL | Status: DC | PRN

## 2020-04-16 MED ORDER — NITROGLYCERIN 0.4 MG SL SUBL: 0.4000 mg | SUBLINGUAL_TABLET | SUBLINGUAL | Status: DC | PRN

## 2020-04-16 MED ORDER — METOCLOPRAMIDE HCL 5 MG PO TABS: 5.0000 mg | ORAL_TABLET | Freq: Three times a day (TID) | ORAL | Status: DC | PRN

## 2020-04-16 MED ORDER — CEFAZOLIN SODIUM-DEXTROSE 2-4 GM/100ML-% IV SOLN
2.0000 g | INTRAVENOUS | Status: AC
  Administered 2020-04-16: 2 g via INTRAVENOUS

## 2020-04-16 MED ORDER — ACETAMINOPHEN 500 MG PO TABS
1000.0000 mg | ORAL_TABLET | Freq: Four times a day (QID) | ORAL | Status: AC
  Administered 2020-04-16 – 2020-04-17 (×4): 1000 mg via ORAL
  Filled 2020-04-16 (×4): qty 2

## 2020-04-16 MED ORDER — OXYCODONE HCL 5 MG PO TABS: 5.0000 mg | ORAL_TABLET | Freq: Once | ORAL | Status: DC | PRN

## 2020-04-16 MED ORDER — DIPHENHYDRAMINE HCL 12.5 MG/5ML PO ELIX: 12.5000 mg | ORAL_SOLUTION | ORAL | Status: DC | PRN

## 2020-04-16 MED ORDER — MENTHOL 3 MG MT LOZG: 1.0000 | LOZENGE | OROMUCOSAL | Status: DC | PRN

## 2020-04-16 MED ORDER — PROPOFOL 10 MG/ML IV BOLUS
INTRAVENOUS | Status: DC | PRN
  Administered 2020-04-16: 200 mg via INTRAVENOUS

## 2020-04-16 MED ORDER — LIDOCAINE 2% (20 MG/ML) 5 ML SYRINGE
INTRAMUSCULAR | Status: DC | PRN
  Administered 2020-04-16: 50 mg via INTRAVENOUS

## 2020-04-16 MED ORDER — SUGAMMADEX SODIUM 500 MG/5ML IV SOLN
INTRAVENOUS | Status: DC | PRN
Start: 2020-04-16 — End: 2020-04-16
  Administered 2020-04-16: 208.6 mg via INTRAVENOUS

## 2020-04-16 MED ORDER — EPINEPHRINE PF 1 MG/ML IJ SOLN
INTRAMUSCULAR | Status: AC
  Filled 2020-04-16: qty 6

## 2020-04-16 MED ORDER — OXYCODONE HCL 5 MG/5ML PO SOLN: 5.0000 mg | Freq: Once | ORAL | Status: DC | PRN

## 2020-04-16 MED ORDER — MAGNESIUM CITRATE PO SOLN: 1.0000 | Freq: Once | ORAL | Status: DC | PRN

## 2020-04-16 MED ORDER — METHOCARBAMOL 500 MG PO TABS
500.0000 mg | ORAL_TABLET | Freq: Four times a day (QID) | ORAL | Status: DC | PRN
  Administered 2020-04-17: 500 mg via ORAL
  Filled 2020-04-16: qty 1

## 2020-04-16 MED ORDER — MIDAZOLAM HCL 5 MG/5ML IJ SOLN
INTRAMUSCULAR | Status: DC | PRN
  Administered 2020-04-16: 2 mg via INTRAVENOUS

## 2020-04-16 MED ORDER — CEFAZOLIN SODIUM-DEXTROSE 2-4 GM/100ML-% IV SOLN
INTRAVENOUS | Status: AC
  Filled 2020-04-16: qty 100

## 2020-04-16 MED ORDER — OXYCODONE HCL 5 MG PO TABS
5.0000 mg | ORAL_TABLET | ORAL | Status: DC | PRN
  Administered 2020-04-17: 5 mg via ORAL
  Filled 2020-04-16: qty 1

## 2020-04-16 MED ORDER — PROPOFOL 10 MG/ML IV BOLUS
INTRAVENOUS | Status: AC
  Filled 2020-04-16: qty 20

## 2020-04-16 MED ORDER — METHOCARBAMOL 500 MG IVPB - SIMPLE MED
500.0000 mg | Freq: Four times a day (QID) | INTRAVENOUS | Status: DC | PRN
  Filled 2020-04-16: qty 50

## 2020-04-16 MED ORDER — FENTANYL CITRATE (PF) 100 MCG/2ML IJ SOLN: 25.0000 ug | INTRAMUSCULAR | Status: DC | PRN

## 2020-04-16 MED ORDER — ONDANSETRON HCL 4 MG PO TABS: 4.0000 mg | ORAL_TABLET | Freq: Four times a day (QID) | ORAL | Status: DC | PRN

## 2020-04-16 MED ORDER — SODIUM CHLORIDE 0.9 % IV SOLN
INTRAVENOUS | Status: AC
  Filled 2020-04-16: qty 500000

## 2020-04-16 MED ORDER — ROCURONIUM BROMIDE 10 MG/ML (PF) SYRINGE
PREFILLED_SYRINGE | INTRAVENOUS | Status: AC
  Filled 2020-04-16: qty 10

## 2020-04-16 MED ORDER — LIDOCAINE 2% (20 MG/ML) 5 ML SYRINGE
INTRAMUSCULAR | Status: AC
  Filled 2020-04-16: qty 5

## 2020-04-16 MED ORDER — ALUM & MAG HYDROXIDE-SIMETH 200-200-20 MG/5ML PO SUSP: 30.0000 mL | ORAL | Status: DC | PRN

## 2020-04-16 MED ORDER — POLYETHYLENE GLYCOL 3350 17 G PO PACK: 17.0000 g | PACK | Freq: Every day | ORAL | Status: DC | PRN

## 2020-04-16 MED ORDER — INSULIN ASPART 100 UNIT/ML ~~LOC~~ SOLN
0.0000 [IU] | Freq: Three times a day (TID) | SUBCUTANEOUS | Status: DC
  Administered 2020-04-16: 3 [IU] via SUBCUTANEOUS
  Administered 2020-04-17: 2 [IU] via SUBCUTANEOUS

## 2020-04-16 MED ORDER — SUGAMMADEX SODIUM 500 MG/5ML IV SOLN
INTRAVENOUS | Status: AC
  Filled 2020-04-16: qty 5

## 2020-04-16 MED ORDER — POTASSIUM CHLORIDE IN NACL 20-0.9 MEQ/L-% IV SOLN
INTRAVENOUS | Status: DC
  Filled 2020-04-16 (×2): qty 1000

## 2020-04-16 MED ORDER — ONDANSETRON HCL 4 MG/2ML IJ SOLN: 4.0000 mg | Freq: Once | INTRAMUSCULAR | Status: DC | PRN

## 2020-04-16 MED ORDER — BISACODYL 5 MG PO TBEC: 5.0000 mg | DELAYED_RELEASE_TABLET | Freq: Every day | ORAL | Status: DC | PRN

## 2020-04-16 MED ORDER — ONDANSETRON HCL 4 MG/2ML IJ SOLN: 4.0000 mg | Freq: Four times a day (QID) | INTRAMUSCULAR | Status: DC | PRN

## 2020-04-16 MED ORDER — HYDROMORPHONE HCL 1 MG/ML IJ SOLN: 0.5000 mg | INTRAMUSCULAR | Status: DC | PRN

## 2020-04-16 MED ORDER — FENTANYL CITRATE (PF) 100 MCG/2ML IJ SOLN
INTRAMUSCULAR | Status: AC
  Filled 2020-04-16: qty 2

## 2020-04-16 MED ORDER — EPINEPHRINE PF 1 MG/ML IJ SOLN
INTRAMUSCULAR | Status: DC | PRN
  Administered 2020-04-16: 1 mg

## 2020-04-16 MED ORDER — CEFAZOLIN SODIUM-DEXTROSE 2-4 GM/100ML-% IV SOLN
2.0000 g | Freq: Four times a day (QID) | INTRAVENOUS | Status: AC
  Administered 2020-04-16 (×2): 2 g via INTRAVENOUS
  Filled 2020-04-16 (×2): qty 100

## 2020-04-16 MED ORDER — DAPAGLIFLOZIN PROPANEDIOL 10 MG PO TABS
10.0000 mg | ORAL_TABLET | Freq: Every day | ORAL | Status: DC
  Administered 2020-04-16: 10 mg via ORAL
  Filled 2020-04-16 (×2): qty 1

## 2020-04-16 MED ORDER — FENTANYL CITRATE (PF) 250 MCG/5ML IJ SOLN: INTRAMUSCULAR | Status: DC | PRN

## 2020-04-16 MED ORDER — SODIUM CHLORIDE 0.9 % IV SOLN
INTRAVENOUS | Status: DC | PRN
  Administered 2020-04-16: 500 mL

## 2020-04-16 MED ORDER — BUPIVACAINE LIPOSOME 1.3 % IJ SUSP
INTRAMUSCULAR | Status: DC | PRN
Start: 2020-04-16 — End: 2020-04-16
  Administered 2020-04-16: 10 mL

## 2020-04-16 MED ORDER — BUPIVACAINE-EPINEPHRINE 0.5% -1:200000 IJ SOLN
INTRAMUSCULAR | Status: DC | PRN
  Administered 2020-04-16: 13 mL

## 2020-04-16 MED ORDER — PHENYLEPHRINE HCL-NACL 10-0.9 MG/250ML-% IV SOLN
INTRAVENOUS | Status: DC | PRN
  Administered 2020-04-16: 40 ug/min via INTRAVENOUS

## 2020-04-16 MED ORDER — ONDANSETRON HCL 4 MG/2ML IJ SOLN
INTRAMUSCULAR | Status: AC
  Filled 2020-04-16: qty 2

## 2020-04-16 MED ORDER — ONDANSETRON HCL 4 MG/2ML IJ SOLN
INTRAMUSCULAR | Status: DC | PRN
  Administered 2020-04-16: 4 mg via INTRAVENOUS

## 2020-04-16 MED ORDER — FENTANYL CITRATE (PF) 100 MCG/2ML IJ SOLN
INTRAMUSCULAR | Status: DC | PRN
  Administered 2020-04-16 (×2): 50 ug via INTRAVENOUS

## 2020-04-16 SURGICAL SUPPLY — 64 items
ANCHOR NEEDLE 9/16 CIR SZ 8 (NEEDLE) IMPLANT
ANCHOR SUT BIO SW 4.75X19.1 (Anchor) ×4 IMPLANT
BLADE EXCALIBUR 4.0X13 (MISCELLANEOUS) ×2 IMPLANT
BLADE SURG SZ11 CARB STEEL (BLADE) ×2 IMPLANT
CANNULA ACUFO 5X76 (CANNULA) ×2 IMPLANT
CLEANER TIP ELECTROSURG 2X2 (MISCELLANEOUS) IMPLANT
CLSR STERI-STRIP ANTIMIC 1/2X4 (GAUZE/BANDAGES/DRESSINGS) ×2 IMPLANT
COVER SURGICAL LIGHT HANDLE (MISCELLANEOUS) ×2 IMPLANT
COVER WAND RF STERILE (DRAPES) IMPLANT
DISSECTOR  3.8MM X 13CM (MISCELLANEOUS)
DISSECTOR 3.5MM X 13CM (MISCELLANEOUS) IMPLANT
DISSECTOR 3.8MM X 13CM (MISCELLANEOUS) IMPLANT
DRAPE POUCH INSTRU U-SHP 10X18 (DRAPES) ×2 IMPLANT
DRAPE STERI 35X30 U-POUCH (DRAPES) ×2 IMPLANT
DRESSING AQUACEL AG SP 3.5X6 (GAUZE/BANDAGES/DRESSINGS) ×1 IMPLANT
DRSG AQUACEL AG ADV 3.5X 4 (GAUZE/BANDAGES/DRESSINGS) IMPLANT
DRSG AQUACEL AG ADV 3.5X 6 (GAUZE/BANDAGES/DRESSINGS) IMPLANT
DRSG AQUACEL AG SP 3.5X6 (GAUZE/BANDAGES/DRESSINGS) ×2
DRSG PAD ABDOMINAL 8X10 ST (GAUZE/BANDAGES/DRESSINGS) IMPLANT
DURAPREP 26ML APPLICATOR (WOUND CARE) ×2 IMPLANT
ELECT NEEDLE TIP 2.8 STRL (NEEDLE) IMPLANT
ELECT REM PT RETURN 15FT ADLT (MISCELLANEOUS) ×2 IMPLANT
FILTER STRAW (MISCELLANEOUS) ×2 IMPLANT
GLOVE BIOGEL PI IND STRL 7.5 (GLOVE) ×1 IMPLANT
GLOVE BIOGEL PI IND STRL 8 (GLOVE) ×1 IMPLANT
GLOVE BIOGEL PI INDICATOR 7.5 (GLOVE) ×1
GLOVE BIOGEL PI INDICATOR 8 (GLOVE) ×1
GLOVE SURG SS PI 7.5 STRL IVOR (GLOVE) ×4 IMPLANT
GLOVE SURG SS PI 8.0 STRL IVOR (GLOVE) ×4 IMPLANT
GOWN STRL REUS W/TWL XL LVL3 (GOWN DISPOSABLE) ×4 IMPLANT
KIT BASIN (CUSTOM PROCEDURE TRAY) ×2 IMPLANT
KIT BASIN OR (CUSTOM PROCEDURE TRAY) ×2 IMPLANT
KIT TURNOVER KIT A (KITS) IMPLANT
MANIFOLD NEPTUNE II (INSTRUMENTS) ×2 IMPLANT
NEEDLE SCORPION MULTI FIRE (NEEDLE) ×2 IMPLANT
NEEDLE SPNL 18GX3.5 QUINCKE PK (NEEDLE) ×2 IMPLANT
PACK SHOULDER (CUSTOM PROCEDURE TRAY) ×2 IMPLANT
PENCIL SMOKE EVACUATOR (MISCELLANEOUS) IMPLANT
PORT APPOLLO RF 90DEGREE MULTI (SURGICAL WAND) IMPLANT
PROTECTOR NERVE ULNAR (MISCELLANEOUS) ×2 IMPLANT
RESTRAINT HEAD UNIVERSAL NS (MISCELLANEOUS) IMPLANT
SLING ARM IMMOBILIZER LRG (SOFTGOODS) IMPLANT
SLING ARM IMMOBILIZER MED (SOFTGOODS) IMPLANT
SLING ULTRA II L (ORTHOPEDIC SUPPLIES) IMPLANT
STRIP CLOSURE SKIN 1/2X4 (GAUZE/BANDAGES/DRESSINGS) IMPLANT
SUCTION FRAZIER HANDLE 12FR (TUBING) ×2
SUCTION TUBE FRAZIER 12FR DISP (TUBING) ×1 IMPLANT
SUT ETHIBOND NAB CT1 #1 30IN (SUTURE) IMPLANT
SUT ETHILON 4 0 PS 2 18 (SUTURE) ×2 IMPLANT
SUT FIBERWIRE #2 38 T-5 BLUE (SUTURE)
SUT PROLENE 3 0 PS 2 (SUTURE) ×2 IMPLANT
SUT TIGER TAPE 7 IN WHITE (SUTURE) IMPLANT
SUT VIC AB 0 CT1 27 (SUTURE) ×2
SUT VIC AB 0 CT1 27XBRD ANTBC (SUTURE) ×1 IMPLANT
SUT VIC AB 1-0 CT2 27 (SUTURE) IMPLANT
SUT VIC AB 2-0 CT2 27 (SUTURE) ×2 IMPLANT
SUT VICRYL 0 UR6 27IN ABS (SUTURE) ×2 IMPLANT
SUTURE FIBERWR #2 38 T-5 BLUE (SUTURE) IMPLANT
SYR 20ML LL LF (SYRINGE) ×2 IMPLANT
TAPE FIBER 2MM 7IN #2 BLUE (SUTURE) ×2 IMPLANT
TOWEL OR 17X26 10 PK STRL BLUE (TOWEL DISPOSABLE) ×2 IMPLANT
TUBING ARTHROSCOPY IRRIG 16FT (MISCELLANEOUS) ×2 IMPLANT
TUBING CONNECTING 10 (TUBING) IMPLANT
WIPE CHG CHLORHEXIDINE 2% (PERSONAL CARE ITEMS) ×2 IMPLANT

## 2020-04-16 NOTE — Transfer of Care (Signed)
Immediate Anesthesia Transfer of Care Note  Patient: Eddie Garner  Procedure(s) Performed: Procedure(s) with comments: SHOULDER ARTHROSCOPY WITH MINI OPEN ROTATOR CUFF REPAIR AND SUBACROMIAL DECOMPRESSION (Left) - 58 MINS  Patient Location: PACU  Anesthesia Type:General  Level of Consciousness:  sedated, patient cooperative and responds to stimulation  Airway & Oxygen Therapy:Patient Spontanous Breathing and Patient connected to face mask oxgen  Post-op Assessment:  Report given to PACU RN and Post -op Vital signs reviewed and stable  Post vital signs:  Reviewed and stable  Last Vitals:  Vitals:   04/16/20 0628  BP: (!) 160/92  Pulse: (!) 59  Resp: 10  Temp: 36.8 C  SpO2: 14%    Complications: No apparent anesthesia complications

## 2020-04-16 NOTE — Evaluation (Signed)
Occupational Therapy Evaluation Patient Details Name: Eddie Garner MRN: [id number] DOB: 12-27-1960 Today's Date: 04/16/2020    History of Present Illness Patient is a 59 year old man s/p L shoulder arthroscopy.   Clinical Impression   Eddie Garner is a 59 year old man s/p L shoulder arthroscopy without functional use of LUE secondary to shoulder restrictions, immobilizer and interscalene block. Therapist provided education and instruction to patient in regards to exercises, precautions, positioning, donning upper extremity clothing and bathing while maintaining shoulder precautions, ice and edema management and donning/doffing sling. Patient verbalized understanding and reports no further needs. Patient to follow up with MD for further therapy needs.      Follow Up Recommendations  Follow surgeon's recommendation for DC plan and follow-up therapies    Equipment Recommendations  None recommended by OT    Recommendations for Other Services       Precautions / Restrictions Precautions Precautions: Shoulder Shoulder Interventions: Shoulder sling/immobilizer;Off for dressing/bathing/exercises;At all times Precaution Booklet Issued: No Restrictions Weight Bearing Restrictions: Yes LUE Weight Bearing: Non weight bearing      Mobility Bed Mobility Overal bed mobility: Independent                Transfers                      Balance                                           ADL either performed or assessed with clinical judgement   ADL Overall ADL's : Modified independent                                             Vision   Vision Assessment?: No apparent visual deficits     Perception     Praxis      Pertinent Vitals/Pain Pain Assessment: No/denies pain (secondary to interscalene block)     Hand Dominance     Extremity/Trunk Assessment Upper Extremity Assessment Upper Extremity Assessment: LUE  deficits/detail LUE Deficits / Details: Non functional LUE secondary to shoulder precautions, immobilization and interscalene block. Grossly able to wiggle fingers.           Communication     Cognition Arousal/Alertness: Awake/alert Behavior During Therapy: WFL for tasks assessed/performed Overall Cognitive Status: Within Functional Limits for tasks assessed                                     General Comments       Exercises     Shoulder Instructions Shoulder Instructions Donning/doffing shirt without moving shoulder: Patient able to independently direct caregiver Method for sponge bathing under operated UE: Patient able to independently direct caregiver Donning/doffing sling/immobilizer: Patient able to independently direct caregiver Correct positioning of sling/immobilizer: Patient able to independently direct caregiver Pendulum exercises (written home exercise program): Patient able to independently direct caregiver ROM for elbow, wrist and digits of operated UE: Patient able to independently direct caregiver Sling wearing schedule (on at all times/off for ADL's): Patient able to independently direct caregiver Proper positioning of operated UE when showering: Patient able to independently direct caregiver Dressing change: Patient able to  independently direct caregiver    Home Living Family/patient expects to be discharged to:: Private residence Living Arrangements: Spouse/significant other                                      Prior Functioning/Environment                   OT Problem List: Decreased strength;Decreased range of motion;Impaired UE functional use      OT Treatment/Interventions:      OT Goals(Current goals can be found in the care plan section) Acute Rehab OT Goals Patient Stated Goal: to go home OT Goal Formulation: All assessment and education complete, DC therapy  OT Frequency:     Barriers to D/C:             Co-evaluation              AM-PAC OT "6 Clicks" Daily Activity     Outcome Measure Help from another person eating meals?: None Help from another person taking care of personal grooming?: None Help from another person toileting, which includes using toliet, bedpan, or urinal?: None Help from another person bathing (including washing, rinsing, drying)?: None Help from another person to put on and taking off regular upper body clothing?: None Help from another person to put on and taking off regular lower body clothing?: None 6 Click Score: 24   End of Session Nurse Communication:  (okay to see per RN)  Activity Tolerance: Patient tolerated treatment well Patient left: in chair;with call bell/phone within reach  OT Visit Diagnosis: Muscle weakness (generalized) (M62.81)                Time: 1610-9604 OT Time Calculation (min): 13 min Charges:  OT Evaluation $OT Eval Low Complexity: 1 Low  Lineth Thielke, OTR/L [place]  Office [phone] Pager: [phone]   Lenward Chancellor 04/16/2020, 5:01 PM

## 2020-04-16 NOTE — Op Note (Signed)
NAME: Eddie Garner, Eddie Garner MEDICAL RECORD OX:[id number] ACCOUNT [id number] DATE OF BIRTH:20-Feb-1961 FACILITY: [facility] LOCATION: [facility] PHYSICIAN:Bennett Vanscyoc Windy Kalata, MD  OPERATIVE REPORT  DATE OF PROCEDURE:  04/16/2020  PREOPERATIVE DIAGNOSIS:  Rotator cuff tear, impingement syndrome of the left shoulder.  POSTOPERATIVE DIAGNOSIS:  Rotator cuff tear, impingement syndrome of the left shoulder.  PROCEDURE PERFORMED: 1.  Left shoulder arthroscopy with subacromial decompression, acromioplasty, CA ligament release.   2.  Mini open rotator cuff repair.  ANESTHESIA:  General.  ASSISTANT:  Lacie Draft, PA  HISTORY:  A 59 year old rotator cuff tear from an injury.  He was indicated for repair of the rotator cuff.  The patient required an extended period of preoperative optimization, particularly of his diabetes.  He was seen by his medical physician and  cleared for surgery.  Preoperatively, the patient had a glucose of 186.  We discussed strict postoperative adherence to diet and his diabetic regime.  Risks and benefits discussed including bleeding, infection, damage to neurovascular structures, no  change in symptoms, worsening symptoms, DVT, PE, anesthetic complications, etc.  TECHNIQUE:  With the patient in a beach chair upright position, had a block and general anesthesia.  The left upper extremity was identified and a timeout.  The left shoulder and upper extremity was prepped and draped in the usual sterile fashion.  A  surgical marker was utilized to delineate the acromion, AC joint, and the coracoid.  The patient had a fairly short medial and lateral acromion.  First, a standard posterolateral portal was created with a 11 blade through the skin only after infiltration with Marcaine with epinephrine.  Arm was in the 70/30 position with longitudinal  traction.  We advanced the arthroscopic cannula in the glenohumeral joint penetrating atraumatically.  We lavaged the joint.  Noted from  underneath the rotator cuff with some minor degenerative fraying of the biceps and of the labrum.  The full thickness  tear of the supraspinatus undersurface of the rotator cuff was noted.  After lavage of the joint he had some minor degenerative changes of the glenohumeral joint as well.  This was lavaged.  I then redirected the camera in the subacromial space, again,  noting the full thickness rotator cuff tear.  Hypertrophic bursa was noted.  Anterolateral portal was fashioned through the skin only with a #11 blade.  We triangulated with a cannula, introduced a shaver and performed a full bursectomy.  I then used an  ArthroWand and released the CA ligament.  The patient has a type 2 acromion.  Full-thickness tear slightly retracted identified.  We proceeded then with the mini open rotator cuff repair.  All instrumentation was removed.  I then made a 2.5 cm incision  over the anterolateral aspect of the acromion.  Subcutaneous tissue was dissected.  Electrocautery was utilized to achieve hemostasis.  Raphae between anterolateral heads was identified and divided in line with the skin incision and self-retractor was  placed.  I digitally lysed adhesions in the subacromial space.  I used a 3 mm Kerrison to remove a small spur off the anterolateral aspect of the acromion.  After inspection the rotator cuff tear in the supraspinatus was noted.  It was approximately a  centimeter by a centimeter.  I used a bare rongeur to debride the edges of the tendon and performed a trough in greater tuberosity to good bleeding bone.  The cuff was mobilized on its bursal and articular surface.  I then placed a suture anchor  SwiveLock lateral to the articular  surface within the bone after a pilot hole with an awl insertion of the SwiveLock with TigerTape.  This was inserted with excellent resistance to pullout.  The TigerTape was then used to secure the supraspinatus with 2  separate throws used by the Scorpion suture passer.   The cuff was then advanced to the bed and secured over the lateral aspect of the greater tuberosity with a 2nd SwiveLock in a double row fashion.  After piloting the hole with an awl and threading  these suture leaflets through the SwiveLock with the arm centrally at its side in neutral tensioning we secured the 2nd SwiveLock.  Excellent resistance to pullout was noted.  The full coverage was noted.  This was an excellent repair, watertight.  The  redundant suture was removed.  Copiously irrigated the wound.  No active bleeding.  Copiously irrigated the subcutaneous tissues as well and the remainder of the cuff was unremarkable.  I repaired the raphae with 0 Vicryl interrupted figure-of-eight  sutures, subcu with 2-0, and skin with Prolene.  Sterile dressing applied, placed in a sling and abduction pillow, extubated without difficulty and transported to the recovery room in satisfactory condition.  The patient tolerated the procedure well.  No complications.  Minimal blood loss.  CN/NUANCE  D:04/16/2020 T:04/16/2020 JOB:[id number]

## 2020-04-16 NOTE — Brief Op Note (Signed)
04/16/2020  9:15 AM  PATIENT:  [name]  59 y.o. male  PRE-OPERATIVE DIAGNOSIS:  Left shoulder rotator cufff tear  POST-OPERATIVE DIAGNOSIS:  Left shoulder rotator cufff tear  PROCEDURE:  Procedure(s) with comments: SHOULDER ARTHROSCOPY WITH MINI OPEN ROTATOR CUFF REPAIR AND SUBACROMIAL DECOMPRESSION (Left) - 90 MINS  SURGEON:  Surgeon(s) and Role:    Susa Day, MD - Primary  PHYSICIAN ASSISTANT:   ASSISTANTS: Bissell   ANESTHESIA:   general  EBL:  10 mL   BLOOD ADMINISTERED:none  DRAINS: none   LOCAL MEDICATIONS USED:  MARCAINE     SPECIMEN:  No Specimen  DISPOSITION OF SPECIMEN:  N/A  COUNTS:  YES  TOURNIQUET:  * No tourniquets in log *  DICTATION: .Other Dictation: Dictation Number [id number]  PLAN OF CARE: Admit for overnight observation  PATIENT DISPOSITION:  PACU - hemodynamically stable.   Delay start of Pharmacological VTE agent (>24hrs) due to surgical blood loss or risk of bleeding: no

## 2020-04-16 NOTE — Anesthesia Procedure Notes (Signed)
Anesthesia Regional Block: Interscalene brachial plexus block   Pre-Anesthetic Checklist: ,, timeout performed, Correct Patient, Correct Site, Correct Laterality, Correct Procedure, Correct Position, site marked, Risks and benefits discussed,  Surgical consent,  Pre-op evaluation,  At surgeon's request and post-op pain management  Laterality: Left  Prep: chloraprep       Needles:  Injection technique: Single-shot  Needle Type: Echogenic Stimulator Needle     Needle Length: 10cm  Needle Gauge: 20     Additional Needles:   Procedures:,,,, ultrasound used (permanent image in chart),,,,  Narrative:  Start time: 04/16/2020 7:00 AM End time: 04/16/2020 7:04 AM Injection made incrementally with aspirations every 5 mL.  Performed by: Personally  Anesthesiologist: Lidia Collum, MD  Additional Notes: Monitors applied. Injection made in 5cc increments. No resistance to injection. Good needle visualization. Patient tolerated procedure well.

## 2020-04-16 NOTE — Interval H&P Note (Signed)
History and Physical Interval Note:  04/16/2020 7:19 AM  Eddie Garner  has presented today for surgery, with the diagnosis of Left shoulder rotator cufff tear.  The various methods of treatment have been discussed with the patient and family. After consideration of risks, benefits and other options for treatment, the patient has consented to  Procedure(s) with comments: SHOULDER ARTHROSCOPY WITH MINI OPEN ROTATOR CUFF REPAIR AND SUBACROMIAL DECOMPRESSION (Left) - 90 MINS as a surgical intervention.  The patient's history has been reviewed, patient examined, no change in status, stable for surgery.  I have reviewed the patient's chart and labs.  Questions were answered to the patient's satisfaction.     Johnn Hai

## 2020-04-16 NOTE — Anesthesia Postprocedure Evaluation (Signed)
Anesthesia Post Note  Patient: Eddie Garner  Procedure(s) Performed: SHOULDER ARTHROSCOPY WITH MINI OPEN ROTATOR CUFF REPAIR AND SUBACROMIAL DECOMPRESSION (Left Shoulder)     Patient location during evaluation: PACU Anesthesia Type: General Level of consciousness: awake and alert Pain management: pain level controlled Vital Signs Assessment: post-procedure vital signs reviewed and stable Respiratory status: spontaneous breathing, nonlabored ventilation and respiratory function stable Cardiovascular status: blood pressure returned to baseline and stable Postop Assessment: no apparent nausea or vomiting Anesthetic complications: no   No complications documented.  Last Vitals:  Vitals:   04/16/20 1158 04/16/20 1254  BP: (!) 166/98 (!) 142/86  Pulse: 71 73  Resp: 17 17  Temp: 36.6 C 36.6 C  SpO2: 99% 97%    Last Pain:  Vitals:   04/16/20 1254  TempSrc: Oral  PainSc:                  Lidia Collum

## 2020-04-16 NOTE — Progress Notes (Signed)
Patient ID: Eddie Garner, male   DOB: 05/18/1961, 59 y.o.   MRN: [id number]   Discussed tight control of sugars and diet. Had watermellon last night. Discussed the increase risk of infection with sugars over 180. Patient indicated he would comply with strict control

## 2020-04-16 NOTE — Anesthesia Procedure Notes (Signed)
Procedure Name: Intubation Date/Time: 04/16/2020 7:34 AM Performed by: Anne Fu, CRNA Pre-anesthesia Checklist: Patient identified, Emergency Drugs available, Suction available, Patient being monitored and Timeout performed Patient Re-evaluated:Patient Re-evaluated prior to induction Oxygen Delivery Method: Circle system utilized Preoxygenation: Pre-oxygenation with 100% oxygen Induction Type: IV induction Ventilation: Mask ventilation without difficulty Laryngoscope Size: Mac and 4 Grade View: Grade II Tube type: Oral Tube size: 7.5 mm Number of attempts: 1 Airway Equipment and Method: Stylet Placement Confirmation: ETT inserted through vocal cords under direct vision,  positive ETCO2 and breath sounds checked- equal and bilateral Secured at: 24 cm Tube secured with: Tape Dental Injury: Teeth and Oropharynx as per pre-operative assessment

## 2020-04-16 NOTE — Consult Note (Addendum)
Medical Consultation   Eddie Garner  IRS:[id number]  DOB: 11-Jul-1961  DOA: 04/16/2020  PCP: [facility]  Requesting physician: Dr. Susa Day, MD  Reason for consultation: Post-op glycemic control    History of Present Illness: Eddie Garner is an 59 y.o. male with history of poorly-controlled type 2 diabetes who underwent left rotator cuff repair surgery today.  [facility] service is consulted for recommendations on patient's glycemic control in the post-op setting for optimal healing.    Hbg A1c 8.6% on 04/14/20. Home regimen includes Farxiga 10 mg PO daily, Januvia 100 mg PO daily and metformin.    Review of Systems:  ROS As per HPI otherwise 10 point review of systems negative.     Past Medical History: Past Medical History:  Diagnosis Date  . Angina pectoris ([place])   . Coronary artery disease   . Diabetes ([place])   . HTN (hypertension)   . Hypercholesteremia   . Hyperlipidemia   . Obesity   . Pain in joint   . Right thyroid nodule     Past Surgical History: Past Surgical History:  Procedure Laterality Date  . APPENDECTOMY    . CARDIAC CATHETERIZATION    . CORONARY ANGIOPLASTY     DES LAD 10/28/08 (Dr. Irish Garner, [facility])  . ELBOW SURGERY Left 07/09/2018   Dr. Caralyn Garner  . FOOT SURGERY    . HAND SURGERY Left 07/09/2018   Dr. Caralyn Garner   . SHOULDER SURGERY Right 03/05/2018   Dr. Veverly Garner.   . THYROID LOBECTOMY Right 11/19/2018  . THYROID LOBECTOMY Right 11/19/2018   Procedure: RIGHT THYROID LOBECTOMY;  Surgeon: Eddie Ok, MD;  Location: [facility];  Service: General;  Laterality: Right;     Allergies:  No Known Allergies   Social History:  reports that he has never smoked. He has never used smokeless tobacco. He reports current alcohol use. He reports that he does not use drugs.   Family History: Family History  Problem Relation Age of Onset  . Atrial fibrillation Father   . Coronary artery  disease Maternal Grandfather   . Diabetes Paternal Grandmother   . CVA Paternal Grandfather   . Stroke Paternal Grandfather      Physical Exam: Vitals:   04/16/20 0539 04/16/20 0628 04/16/20 0931  BP:  (!) 160/92 (!) 166/89  Pulse:  (!) 59 72  Resp:  10 14  Temp:  98.2 F (36.8 C)   TempSrc:  Oral   SpO2:  98% 96%  Weight: 104.3 kg    Height: 5\' 10"  (1.778 m)      Constitutional: Alert and awake, oriented x3, not in any acute distress. ENMT: external ears and nose appear normal, moist mucus membranes, hearing grossly normal CVS: RRR, no LE edema Respiratory:  Respiratory effort normal. No accessory muscle use.  Abdomen: soft nontender, nondistended, normal bowel sounds Musculoskeletal: : left shoulder in immobilizing sling, distal sensation intact   Data reviewed:  I have personally reviewed following labs and imaging studies Labs:  CBC: Recent Labs  Lab 04/14/20 0849  WBC 6.2  HGB 14.0  HCT 42.0  MCV 92.7  PLT 009    Basic Metabolic Panel: Recent Labs  Lab 04/14/20 0849  NA 141  K 4.0  CL 107  CO2 24  GLUCOSE 213*  BUN 23*  CREATININE 0.80  CALCIUM 9.0   GFR Estimated Creatinine Clearance: 120.2 mL/min (by C-G  formula based on SCr of 0.8 mg/dL). Liver Function Tests: No results for input(s): AST, ALT, ALKPHOS, BILITOT, PROT, ALBUMIN in the last 168 hours. No results for input(s): LIPASE, AMYLASE in the last 168 hours. No results for input(s): AMMONIA in the last 168 hours. Coagulation profile No results for input(s): INR, PROTIME in the last 168 hours.  Cardiac Enzymes: No results for input(s): CKTOTAL, CKMB, CKMBINDEX, TROPONINI in the last 168 hours. BNP: Invalid input(s): POCBNP CBG: Recent Labs  Lab 04/14/20 0847 04/16/20 0544  GLUCAP 208* 181*   D-Dimer No results for input(s): DDIMER in the last 72 hours. Hgb A1c Recent Labs    04/14/20 0849  HGBA1C 8.6*   Lipid Profile No results for input(s): CHOL, HDL, LDLCALC, TRIG,  CHOLHDL, LDLDIRECT in the last 72 hours. Thyroid function studies No results for input(s): TSH, T4TOTAL, T3FREE, THYROIDAB in the last 72 hours.  Invalid input(s): FREET3 Anemia work up No results for input(s): VITAMINB12, FOLATE, FERRITIN, TIBC, IRON, RETICCTPCT in the last 72 hours. Urinalysis No results found for: COLORURINE, APPEARANCEUR, LABSPEC, [place], GLUCOSEU, [place], [place], [place], PROTEINUR, UROBILINOGEN, NITRITE, LEUKOCYTESUR   Microbiology Recent Results (from the past 240 hour(s))  SARS CORONAVIRUS 2 (TAT 6-24 HRS) Nasopharyngeal Nasopharyngeal Swab     Status: None   Collection Time: 04/14/20  9:40 AM   Specimen: Nasopharyngeal Swab  Result Value Ref Range Status   SARS Coronavirus 2 NEGATIVE NEGATIVE Final    Comment: (NOTE) SARS-CoV-2 target nucleic acids are NOT DETECTED.  The SARS-CoV-2 RNA is generally detectable in upper and lower respiratory specimens during the acute phase of infection. Negative results do not preclude SARS-CoV-2 infection, do not rule out co-infections with other pathogens, and should not be used as the sole basis for treatment or other patient management decisions. Negative results must be combined with clinical observations, patient history, and epidemiological information. The expected result is Negative.  Fact Sheet for Patients: SugarRoll.be  Fact Sheet for Healthcare Providers: https://www.woods-mathews.com/  This test is not yet approved or cleared by the [place] FDA and  has been authorized for detection and/or diagnosis of SARS-CoV-2 by FDA under an Emergency Use Authorization (EUA). This EUA will remain  in effect (meaning this test can be used) for the duration of the COVID-19 declaration under Se ction 564(b)(1) of the Act, 21 U.S.C. section 360bbb-3(b)(1), unless the authorization is terminated or revoked sooner.  Performed at [facility], [place] [address], [place], [place] [address]        Inpatient Medications:   Scheduled Meds: Continuous Infusions: . ceFAZolin    .  ceFAZolin (ANCEF) IV    . lactated ringers 125 mL/hr at 04/16/20 0725  . methocarbamol (ROBAXIN) IV       Radiological Exams on Admission: No results found.  Impression/Recommendations Active Problems:   Type 2 diabetes mellitus with complication (HCC)   Complete rotator cuff tear  Type 2 Diabetes -  Recommend strict glycemic control in post-op setting for optimal healing.  Goal should be glucose less than 180.  Recommend continuing patient on Farxiga (dapagliflozin) and Januvia (sitagliptin) during hospital stay.  Hold metformin, okay to resume on discharge.  Additional coverage with sliding scale Novolog with CBG's before meals and at bedtime during hospital stay.  Sliding scale Novolog can be used short term in recovery period as outpatient as well, and would be ideal supplemental coverage to control hyperglycemia.   If patient unable or unwilling to use insulin for any reason, could consider  using a sulfonylurea (such as glipizide) AS NEEDED to keep glucose less than 180.  This is an off-label use of the medication, however, and I would recommend very close follow up with PCP.  If this option is used, recommend patient check glucose before meals, take glipizide if glucose is > 150 (if eating at least 50% of a regular meal).   Hypertension - would continue home medications.  Consider PRN oral hydralazine 20 mg q6h PRN for SBP>160 or SBP>90 during hospital stay.  CAD - resume Plavix when safe from surgical standpoint.  Continue statin.   Thank you for this consultation.  Please do not hesitate to call with questions.  Time Spent: 20 minutes  Ezekiel Slocumb M.D. [facility] 04/16/2020, 9:49 AM

## 2020-04-17 ENCOUNTER — Encounter (HOSPITAL_COMMUNITY): Payer: Self-pay | Admitting: Specialist

## 2020-04-17 DIAGNOSIS — E118 Type 2 diabetes mellitus with unspecified complications: Secondary | ICD-10-CM

## 2020-04-17 DIAGNOSIS — S46012A Strain of muscle(s) and tendon(s) of the rotator cuff of left shoulder, initial encounter: Secondary | ICD-10-CM | POA: Diagnosis not present

## 2020-04-17 LAB — CBC
HCT: 38.9 % — ABNORMAL LOW (ref 39.0–52.0)
Hemoglobin: 13 g/dL (ref 13.0–17.0)
MCH: 30.8 pg (ref 26.0–34.0)
MCHC: 33.4 g/dL (ref 30.0–36.0)
MCV: 92.2 fL (ref 80.0–100.0)
Platelets: 200 10*3/uL (ref 150–400)
RBC: 4.22 MIL/uL (ref 4.22–5.81)
RDW: 12.7 % (ref 11.5–15.5)
WBC: 8.4 10*3/uL (ref 4.0–10.5)
nRBC: 0 % (ref 0.0–0.2)

## 2020-04-17 LAB — BASIC METABOLIC PANEL
Anion gap: 8 (ref 5–15)
BUN: 18 mg/dL (ref 6–20)
CO2: 24 mmol/L (ref 22–32)
Calcium: 8.2 mg/dL — ABNORMAL LOW (ref 8.9–10.3)
Chloride: 106 mmol/L (ref 98–111)
Creatinine, Ser: 0.79 mg/dL (ref 0.61–1.24)
GFR calc Af Amer: >60 mL/min (ref >60)
GFR calc non Af Amer: >60 mL/min (ref >60)
Glucose, Bld: 140 mg/dL — ABNORMAL HIGH (ref 70–99)
Potassium: 3.7 mmol/L (ref 3.5–5.1)
Sodium: 138 mmol/L (ref 135–145)

## 2020-04-17 LAB — GLUCOSE, CAPILLARY
Glucose-Capillary: 122 mg/dL — ABNORMAL HIGH (ref 70–99)
Glucose-Capillary: 125 mg/dL — ABNORMAL HIGH (ref 70–99)

## 2020-04-17 MED ORDER — DOCUSATE SODIUM 100 MG PO CAPS: 100.0000 mg | ORAL_CAPSULE | Freq: Two times a day (BID) | ORAL | 0 refills | Status: DC

## 2020-04-17 MED ORDER — METHOCARBAMOL 500 MG PO TABS: 500.0000 mg | ORAL_TABLET | Freq: Four times a day (QID) | ORAL | 1 refills | Status: DC | PRN

## 2020-04-17 MED ORDER — OXYCODONE HCL 5 MG PO TABS: 5.0000 mg | ORAL_TABLET | ORAL | 0 refills | Status: DC | PRN

## 2020-04-17 MED ORDER — LINAGLIPTIN 5 MG PO TABS: 5.0000 mg | ORAL_TABLET | Freq: Every day | ORAL | Status: DC

## 2020-04-17 NOTE — Discharge Instructions (Signed)
Call your primary doctor upon discharge to get a script for Farxiga called in Aquacel dressing may remain in place until follow up. May shower with aquacel dressing in place. If the dressing becomes saturated or peels off, you may remove it and place a new dressing with gauze and tape which should be kept clean and dry and changed daily. Use sling at times except when exercising or showering No driving for 4-6 weeks No lifting for 6 weeks operative arm Pendulum exercises as instructed. Ok to move wrist, elbow, and hand. See Dr. Tonita Cong in 10-14 days. Take one aspirin per day with a meal if not on a blood thinner or allergic to aspirin.

## 2020-04-17 NOTE — Progress Notes (Addendum)
Subjective: 1 Day Post-Op Procedure(s) (LRB): SHOULDER ARTHROSCOPY WITH MINI OPEN ROTATOR CUFF REPAIR AND SUBACROMIAL DECOMPRESSION (Left) Patient reports pain as mild. No numbness or tingling.  States hasn't been taking farxiga at home. Ready for D/C    Objective: Vital signs in last 24 hours: Temp:  [97.8 F (36.6 C)-98.5 F (36.9 C)] 97.9 F (36.6 C) (06/18 0542) Pulse Rate:  [61-73] 66 (06/18 0542) Resp:  [13-28] 18 (06/18 0542) BP: (122-166)/(79-99) 132/99 (06/18 0542) SpO2:  [92 %-99 %] 97 % (06/18 0542)  Intake/Output from previous day: 06/17 0701 - 06/18 0700 In: 3015.6 [P.O.:1800; I.V.:915.6; IV Piggyback:300] Out: 2610 [Urine:2600; Blood:10] Intake/Output this shift: No intake/output data recorded.  Recent Labs    04/14/20 0849 04/17/20 0256  HGB 14.0 13.0   Recent Labs    04/14/20 0849 04/17/20 0256  WBC 6.2 8.4  RBC 4.53 4.22  HCT 42.0 38.9*  PLT 226 200   Recent Labs    04/14/20 0849 04/17/20 0256  NA 141 138  K 4.0 3.7  CL 107 106  CO2 24 24  BUN 23* 18  CREATININE 0.80 0.79  GLUCOSE 213* 140*  CALCIUM 9.0 8.2*   No results for input(s): LABPT, INR in the last 72 hours.  Neurologically intact ABD soft Neurovascular intact Sensation intact distally Intact pulses distally Dorsiflexion/Plantar flexion intact Incision: dressing C/D/I and no drainage No cellulitis present Compartment soft no sign of DVT   Assessment/Plan: 1 Day Post-Op Procedure(s) (LRB): SHOULDER ARTHROSCOPY WITH MINI OPEN ROTATOR CUFF REPAIR AND SUBACROMIAL DECOMPRESSION (Left) Advance diet Up with therapy D/C IV fluids D/C today Recommend resuming Farxiga, close follow up with PCP for DM control-  PER DR BEANE CALL PCP TODAY TO ARRANGE FARXIGA FOR OUTPT Follow up in 2 weeks with Dr Tonita Cong  Ellison Hughs Deatra Robinson 04/17/2020, 8:02 AM

## 2020-04-17 NOTE — Progress Notes (Signed)
Patient discharged prior to my evaluation today. EMR records reviewed, blood glucose in acceptable range.  Eddie Ren MD [facility]

## 2020-04-17 NOTE — Discharge Summary (Signed)
Patient ID: Eddie Garner MRN: [id number] DOB/AGE: 1961/10/03 58 y.o.  Admit date: 04/16/2020 Discharge date: 04/17/2020  Admission Diagnoses:  Active Problems:   Type 2 diabetes mellitus with complication (HCC)   Complete rotator cuff tear   Discharge Diagnoses:  Same  Past Medical History:  Diagnosis Date   Angina pectoris ([place])    Coronary artery disease    Diabetes ([place])    HTN (hypertension)    Hypercholesteremia    Hyperlipidemia    Obesity    Pain in joint    Right thyroid nodule     Surgeries: Procedure(s): SHOULDER ARTHROSCOPY WITH MINI OPEN ROTATOR CUFF REPAIR AND SUBACROMIAL DECOMPRESSION on 04/16/2020   Consultants: Treatment Team:  Damita Lack, MD  Discharged Condition: Improved  Hospital Course: Eddie Garner is an 59 y.o. male who was admitted 04/16/2020 for operative treatment of<principal problem not specified>. Patient has severe unremitting pain that affects sleep, daily activities, and work/hobbies. After pre-op clearance the patient was taken to the operating room on 04/16/2020 and underwent  Procedure(s): SHOULDER ARTHROSCOPY WITH MINI OPEN ROTATOR CUFF REPAIR AND SUBACROMIAL DECOMPRESSION.    Patient was given perioperative antibiotics:  Anti-infectives (From admission, onward)   Start     Dose/Rate Route Frequency Ordered Stop   04/16/20 1400  ceFAZolin (ANCEF) IVPB 2g/100 mL premix        2 g 200 mL/hr over 30 Minutes Intravenous Every 6 hours 04/16/20 1051 04/16/20 2119   04/16/20 0809  polymyxin B 500,000 Units, bacitracin 50,000 Units in sodium chloride 0.9 % 500 mL irrigation  Status:  Discontinued          As needed 04/16/20 0810 04/16/20 1027   04/16/20 0600  ceFAZolin (ANCEF) IVPB 2g/100 mL premix        2 g 200 mL/hr over 30 Minutes Intravenous On call to O.R. 04/16/20 4097 04/16/20 0739   04/16/20 0528  ceFAZolin (ANCEF) 2-4 GM/100ML-% IVPB       Note to Pharmacy: Randa Evens  : cabinet override      04/16/20  0528 04/16/20 1018       Patient was given sequential compression devices, early ambulation, and chemoprophylaxis to prevent DVT.  Patient benefited maximally from hospital stay and there were no complications.    Recent vital signs:  Patient Vitals for the past 24 hrs:  BP Temp Temp src Pulse Resp SpO2  04/17/20 0542 (!) 132/99 97.9 F (36.6 C) Oral 66 18 97 %  04/17/20 0139 129/79 98.3 F (36.8 C) Oral 62 18 92 %  04/16/20 2208 (!) 154/89 97.8 F (36.6 C) Oral 61 18 94 %  04/16/20 1815 122/80 98.5 F (36.9 C) Oral 65 17 95 %  04/16/20 1356 132/80 98.5 F (36.9 C) -- 72 17 95 %  04/16/20 1254 (!) 142/86 97.9 F (36.6 C) Oral 73 17 97 %  04/16/20 1158 (!) 166/98 97.8 F (36.6 C) Oral 71 17 99 %     Recent laboratory studies:  Recent Labs    04/17/20 0256  WBC 8.4  HGB 13.0  HCT 38.9*  PLT 200  NA 138  K 3.7  CL 106  CO2 24  BUN 18  CREATININE 0.79  GLUCOSE 140*  CALCIUM 8.2*     Discharge Medications:   Allergies as of 04/17/2020   No Known Allergies     Medication List    STOP taking these medications   sildenafil 20 MG tablet Commonly known as: REVATIO  TAKE these medications   clopidogrel 75 MG tablet Commonly known as: PLAVIX TAKE 1 TABLET BY MOUTH DAILY WITH BREAKFAST. PLEASE SCHEDULE FOLLOW UP APPR FOR FURTHER REFILLS What changed:   how much to take  how to take this  when to take this  additional instructions   docusate sodium 100 MG capsule Commonly known as: COLACE Take 1 capsule (100 mg total) by mouth 2 (two) times daily.   Farxiga 10 MG Tabs tablet Generic drug: dapagliflozin propanediol Take 10 mg by mouth daily with lunch.   Fish Oil 1000 MG Caps Take 1,000 mg by mouth daily.   glucose blood test strip 1 each by Other route as needed for other. Use as instructed   lisinopril 10 MG tablet Commonly known as: ZESTRIL Take 1 tablet (10 mg total) by mouth daily. What changed: when to take this   metFORMIN 750 MG 24  hr tablet Commonly known as: GLUCOPHAGE-XR Take 750 mg by mouth daily.   metFORMIN 500 MG 24 hr tablet Commonly known as: GLUCOPHAGE-XR Take 500 mg by mouth 2 (two) times daily.   methocarbamol 500 MG tablet Commonly known as: ROBAXIN Take 1 tablet (500 mg total) by mouth every 6 (six) hours as needed for muscle spasms.   nitroGLYCERIN 0.4 MG SL tablet Commonly known as: NITROSTAT Place 0.4 mg under the tongue every 5 (five) minutes as needed for chest pain.   oxyCODONE 5 MG immediate release tablet Commonly known as: Oxy IR/ROXICODONE Take 1-2 tablets (5-10 mg total) by mouth every 4 (four) hours as needed for moderate pain (pain score 4-6).   simvastatin 20 MG tablet Commonly known as: ZOCOR TAKE 1 TABLET BY MOUTH EVERYDAY AT BEDTIME What changed:   how much to take  how to take this  when to take this  additional instructions   sitaGLIPtin 100 MG tablet Commonly known as: JANUVIA Take 100 mg by mouth daily.   vitamin C 1000 MG tablet Take 1,000 mg by mouth daily.       Diagnostic Studies: No results found.  Disposition: Discharge disposition: 01-Home or Self Care       Discharge Instructions    Call MD / Call 911   Complete by: As directed    If you experience chest pain or shortness of breath, CALL 911 and be transported to the hospital emergency room.  If you develope a fever above 101 F, pus (white drainage) or increased drainage or redness at the wound, or calf pain, call your surgeon's office.   Constipation Prevention   Complete by: As directed    Drink plenty of fluids.  Prune juice may be helpful.  You may use a stool softener, such as Colace (over the counter) 100 mg twice a day.  Use MiraLax (over the counter) for constipation as needed.   Diet - low sodium heart healthy   Complete by: As directed    Increase activity slowly as tolerated   Complete by: As directed        Follow-up Information    Susa Day, MD Follow up in 2 week(s).    Specialty: Orthopedic Surgery Contact information: [address] [place] [address] [phone]                Signed: Cecilie Kicks 04/17/2020, 11:26 AM

## 2020-06-29 ENCOUNTER — Other Ambulatory Visit: Payer: Self-pay | Admitting: Interventional Cardiology

## 2020-08-29 IMAGING — US US THYROID
1 series · 13 of 25 positions shown · non-contrast
Comparison: None.

CLINICAL DATA: Palpable abnormality. 56-year-old male with a
palpable right anterior neck mass.

EXAM:
THYROID ULTRASOUND
TECHNIQUE: Ultrasound examination of the thyroid gland and adjacent soft
tissues was performed.

[Series 1: us thyroid · 0.06mm/px · 13 of 44 slices shown]
[im 1/44]
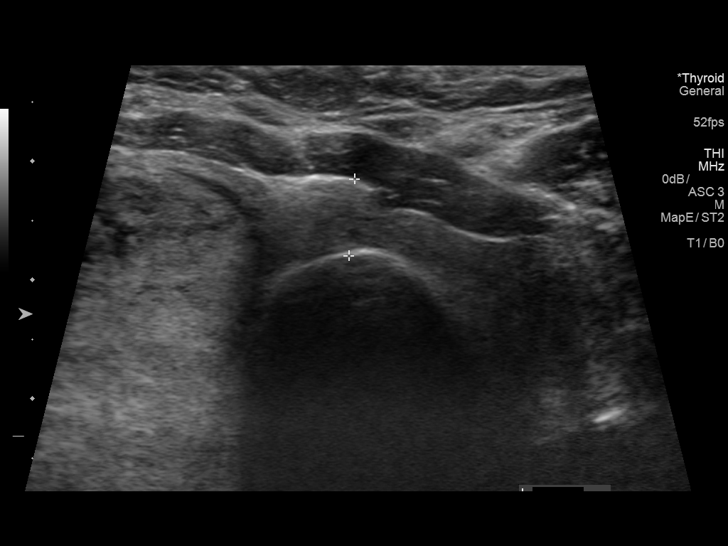
[im 4/44]
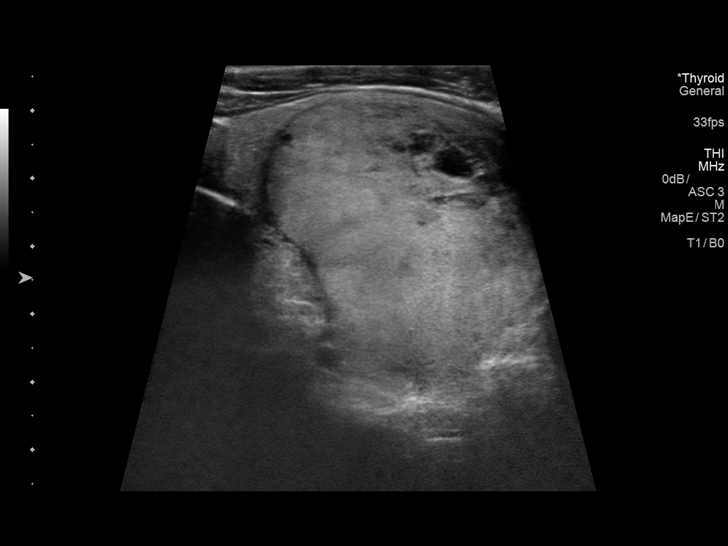
[im 8/44]
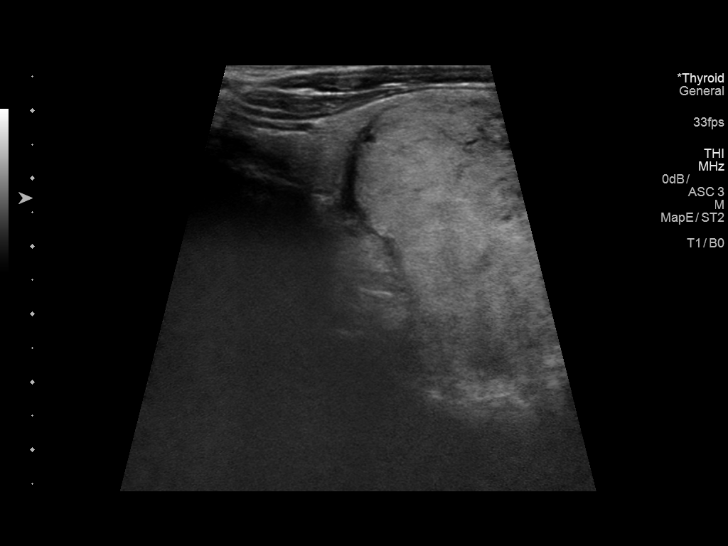
[im 11/44]
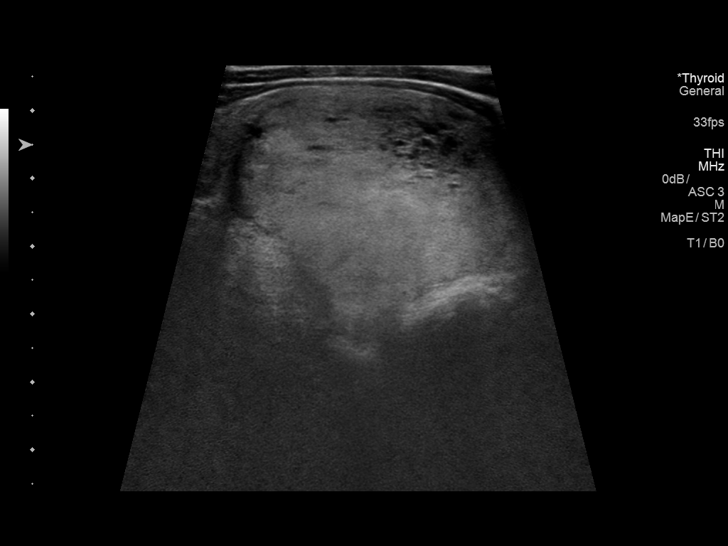
[im 15/44]
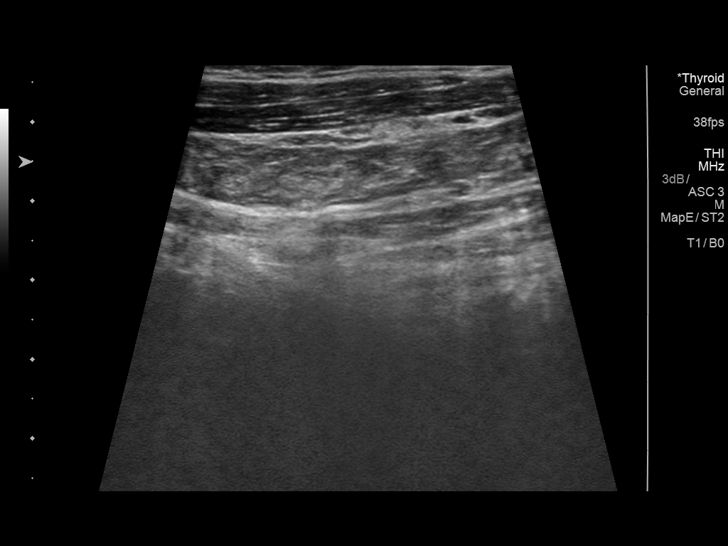
[im 18/44]
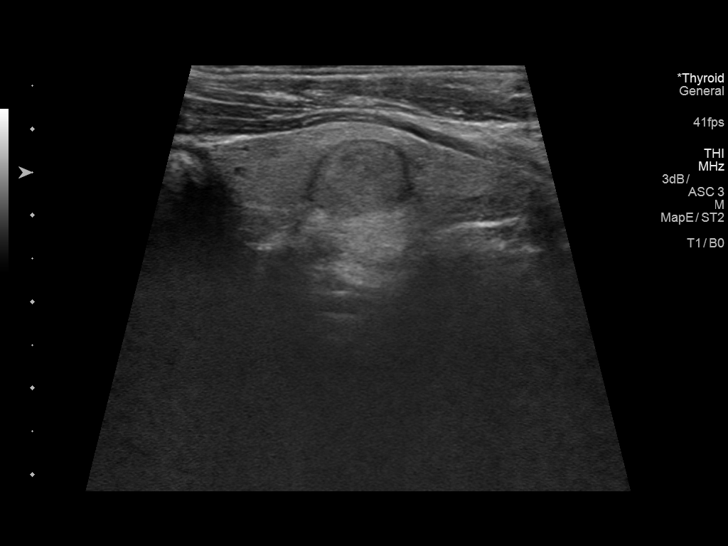
[im 22/44]
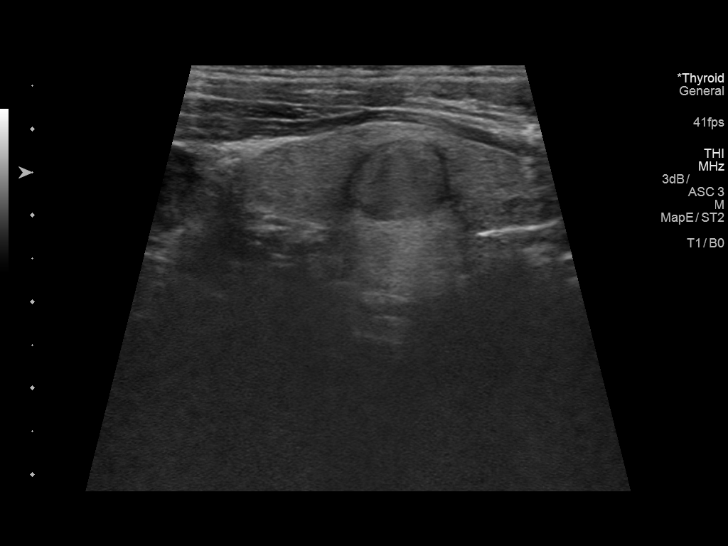
[im 26/44]
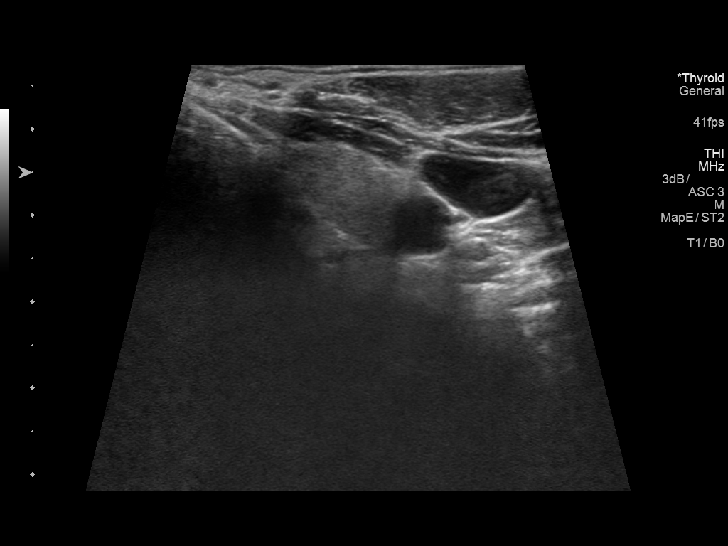
[im 29/44]
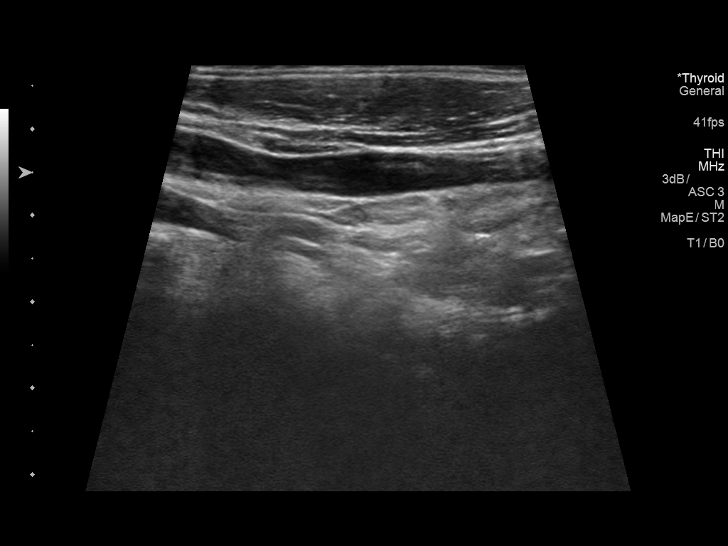
[im 33/44]
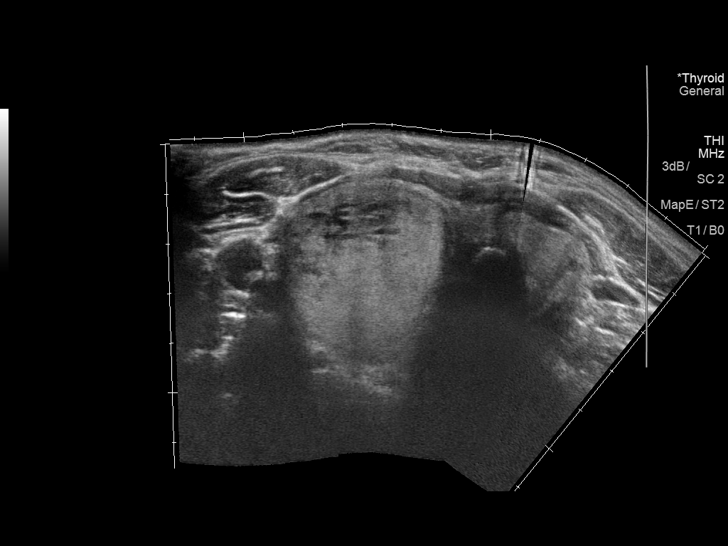
[im 36/44]
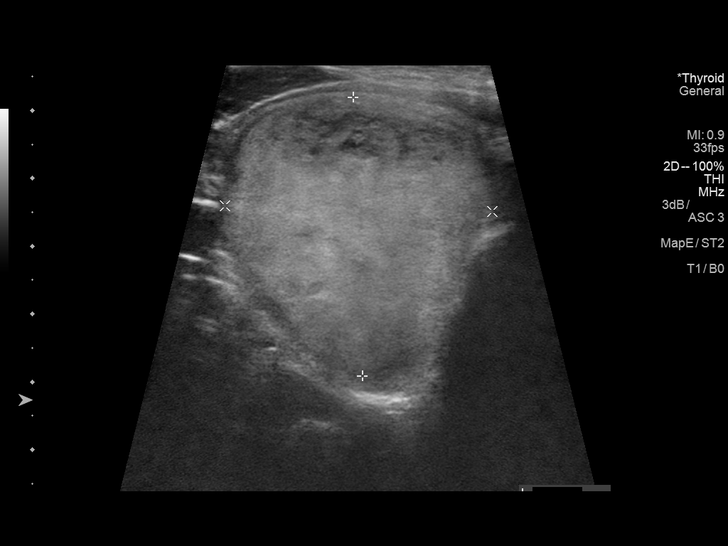
[im 40/44]
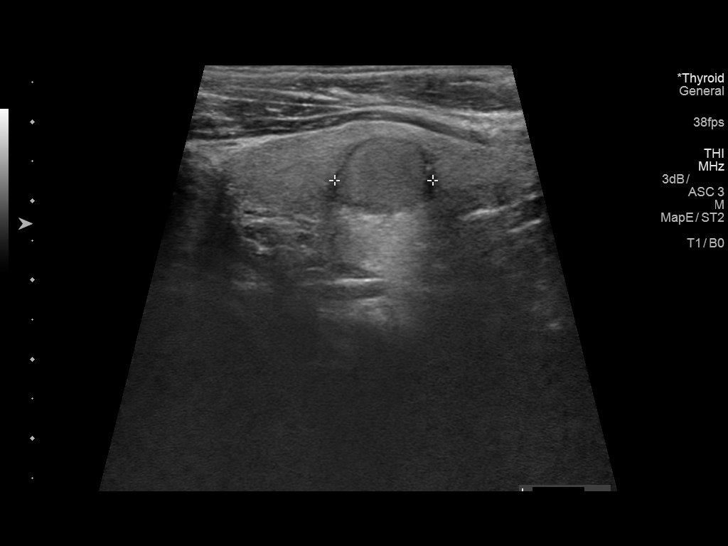
[im 44/44]
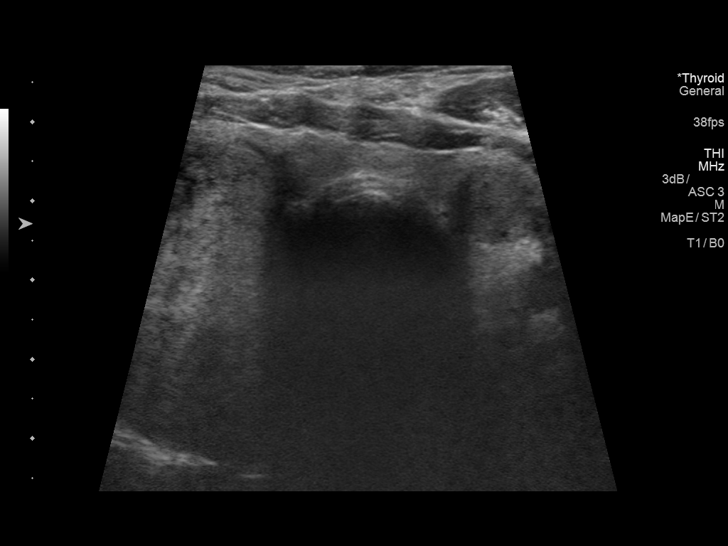

[13 of 25 positions shown; findings below may reference images not displayed]

FINDINGS: Parenchymal Echotexture: Mildly heterogenous

Isthmus: 0.7 cm

Right lobe: 5.6 x 4.4 x 3.7 cm

Left lobe: 3.9 x 1.6 x 1.5 cm

_________________________________________________________

Estimated total number of nodules >/= 1 cm: 2

Number of spongiform nodules >/=  2 cm not described below (TR1): 0

Number of mixed cystic and solid nodules >/= 1.5 cm not described
below (TR2): 0

_________________________________________________________

Nodule # 1:

Location: Right; Mid

Maximum size: 4.5 cm; Other 2 dimensions: 4.1 x 3.9 cm

Composition: solid/almost completely solid (2)

Echogenicity: isoechoic (1)

Shape: not taller-than-wide (0)

Margins: smooth (0)

Echogenic foci: none (0)

ACR TI-RADS total points: 3.

ACR TI-RADS risk category: TR3 (3 points).

ACR TI-RADS recommendations:

**Given size (>/= 2.5 cm) and appearance, fine needle aspiration of
this mildly suspicious nodule should be considered based on TI-RADS
criteria.

_________________________________________________________

Nodule # 2:

Location: Left; Mid

Maximum size: 1.2 cm; Other 2 dimensions: 1.1 x 1.0 cm

Composition: solid/almost completely solid (2)

Echogenicity: hypoechoic (2)

Shape: not taller-than-wide (0)

Margins: smooth (0)

Echogenic foci: none (0)

ACR TI-RADS total points: 4.

ACR TI-RADS risk category: TR4 (4-6 points).

ACR TI-RADS recommendations:

*Given size (>/= 1 - 1.4 cm) and appearance, a follow-up ultrasound
in 1 year should be considered based on TI-RADS criteria.

_________________________________________________________
IMPRESSION: 1. The 4.5 cm TI-RADS category 3 nodule (labeled # 1) occupying the
majority of the right mid thyroid gland meets criteria for
consideration of fine-needle aspiration biopsy.
2. A 1.2 cm TI-RADS category 4 nodule (labeled # 2) in the left mid
gland meets criteria for follow-up ultrasound in 1 year.

The above is in keeping with the ACR TI-RADS recommendations - [HOSPITAL] 3269;[DATE].

## 2020-10-01 ENCOUNTER — Other Ambulatory Visit: Payer: Self-pay | Admitting: Interventional Cardiology

## 2020-11-30 ENCOUNTER — Telehealth: Payer: Self-pay | Admitting: Interventional Cardiology

## 2020-11-30 DIAGNOSIS — I25118 Atherosclerotic heart disease of native coronary artery with other forms of angina pectoris: Secondary | ICD-10-CM

## 2020-11-30 NOTE — Telephone Encounter (Signed)
Patient wanted to know if Dr. Irish Lack could order his stress test asap. 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

## 2020-11-30 NOTE — Telephone Encounter (Signed)
OK to order stress test.  Not sure if he needs nuclear stress test or ETT.  Whichever one he needs is fine.

## 2020-11-30 NOTE — Telephone Encounter (Signed)
I spoke with the pt and he needs the Nuc med stress test okay per Dr. Irish Lack.   Order placed and instructions sent to the pt.

## 2020-11-30 NOTE — Addendum Note (Signed)
Addended by: Stephani Police on: 11/30/2020 01:55 PM   Modules accepted: Orders

## 2020-11-30 NOTE — Telephone Encounter (Addendum)
Will forward to Dr. Irish Lack for the order.   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    Jettie Booze, MD  01/15/2019 1:19 PM EDT      Normal stress test   Next OV 12/2020

## 2020-12-03 NOTE — Addendum Note (Signed)
Addended by: Thompson Grayer on: 12/03/2020 12:15 PM   Modules accepted: Orders

## 2020-12-03 NOTE — Telephone Encounter (Signed)
I don't know how.  JV

## 2020-12-04 NOTE — Addendum Note (Signed)
Addended by: Jettie Booze on: 12/04/2020 10:50 AM   Modules accepted: Orders

## 2020-12-04 NOTE — Telephone Encounter (Signed)
Dr. Irish Lack signed the attestation order.

## 2020-12-21 ENCOUNTER — Inpatient Hospital Stay (HOSPITAL_COMMUNITY)
Admission: RE | Admit: 2020-12-21 | Discharge: 2020-12-21 | Disposition: A | Discharge status: Home / Self Care | Payer: 59 | Source: Ambulatory Visit

## 2020-12-21 ENCOUNTER — Telehealth (HOSPITAL_COMMUNITY): Payer: Self-pay | Admitting: *Deleted

## 2020-12-21 NOTE — Progress Notes (Signed)
Positive for covid 11/11/20- results in [place]. No pre-procedure covid test needed per 90 day policy.

## 2020-12-21 NOTE — Telephone Encounter (Signed)
Patient given detailed instructions per Myocardial Perfusion Study Information Sheet for the test on 12/23/20. Patient notified to arrive 15 minutes early and that it is imperative to arrive on time for appointment to keep from having the test rescheduled.  If you need to cancel or reschedule your appointment, please call the office within 24 hours of your appointment. . Patient verbalized understanding. Eddie Garner    

## 2020-12-23 ENCOUNTER — Other Ambulatory Visit: Payer: Self-pay

## 2020-12-23 ENCOUNTER — Ambulatory Visit (HOSPITAL_COMMUNITY): Payer: 59 | Attending: Internal Medicine

## 2020-12-23 DIAGNOSIS — I25118 Atherosclerotic heart disease of native coronary artery with other forms of angina pectoris: Secondary | ICD-10-CM | POA: Insufficient documentation

## 2020-12-23 LAB — MYOCARDIAL PERFUSION IMAGING
Estimated workload: 10.9 METS
Exercise duration (min): 9 min
Exercise duration (sec): 30 s
LV dias vol: 78 mL (ref 62–150)
LV sys vol: 34 mL
MPHR: 160 {beats}/min
Peak HR: 151 {beats}/min
Percent HR: 94 %
RPE: 19
Rest HR: 71 {beats}/min
SDS: 0
SRS: 0
SSS: 0
TID: 0.78

## 2020-12-23 MED ORDER — TECHNETIUM TC 99M TETROFOSMIN IV KIT
30.9000 | PACK | Freq: Once | INTRAVENOUS | Status: AC | PRN
  Administered 2020-12-23: 30.9 via INTRAVENOUS
  Filled 2020-12-23: qty 31

## 2020-12-23 MED ORDER — TECHNETIUM TC 99M TETROFOSMIN IV KIT
10.6000 | PACK | Freq: Once | INTRAVENOUS | Status: AC | PRN
  Administered 2020-12-23: 10.6 via INTRAVENOUS
  Filled 2020-12-23: qty 11

## 2021-01-13 NOTE — Progress Notes (Unsigned)
Cardiology Office Note   Date:  01/14/2021   ID:  Eddie Garner, DOB Jun 07, 1961, MRN [id number]  PCP:  [facility] [place]    No chief complaint on file.  CAD  Wt Readings from Last 3 Encounters:  01/14/21 234 lb 9.6 oz (106.4 kg)  12/23/20 230 lb (104.3 kg)  04/16/20 230 lb (104.3 kg)       History of Present Illness: Eddie Garner is a 60 y.o. male  Who has had an LAD drug eluting stent in 2009.    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  Working on getting HbA1C down, was at 9, in the past.In 11/18, it was 8.3.  He has had shoulder, elbow and thyroid surgery. No cardiac issues. He was out of work. He gained some weight back after that.   A1C in 2/20 was 8.8.  He had COVID in Jan 2022.  He quarantined at home.  He still feels tired since that time.    Denies : Chest pain. Dizziness. Leg edema. Nitroglycerin use. Orthopnea. Palpitations. Paroxysmal nocturnal dyspnea. Shortness of breath. Syncope.   Getting back in to walking.  No sx.       Past Medical History:  Diagnosis Date  . Angina pectoris ([place])   . Coronary artery disease   . Diabetes ([place])   . HTN (hypertension)   . Hypercholesteremia   . Hyperlipidemia   . Obesity   . Pain in joint   . Right thyroid nodule     Past Surgical History:  Procedure Laterality Date  . APPENDECTOMY    . CARDIAC CATHETERIZATION    . CORONARY ANGIOPLASTY     DES LAD 10/28/08 (Dr. Irish Lack, [facility])  . ELBOW SURGERY Left 07/09/2018   Dr. Caralyn Guile  . FOOT SURGERY    . HAND SURGERY Left 07/09/2018   Dr. Caralyn Guile   . SHOULDER ARTHROSCOPY WITH ROTATOR CUFF REPAIR AND SUBACROMIAL DECOMPRESSION Left 04/16/2020   Procedure: SHOULDER ARTHROSCOPY WITH MINI OPEN ROTATOR CUFF REPAIR AND SUBACROMIAL DECOMPRESSION;  Surgeon: Susa Day, MD;  Location: [facility];  Service: Orthopedics;  Laterality: Left;  90 MINS  . SHOULDER  SURGERY Right 03/05/2018   Dr. Veverly Fells.   . THYROID LOBECTOMY Right 11/19/2018  . THYROID LOBECTOMY Right 11/19/2018   Procedure: RIGHT THYROID LOBECTOMY;  Surgeon: Ralene Ok, MD;  Location: [place] [facility];  Service: General;  Laterality: Right;     Current Outpatient Medications  Medication Sig Dispense Refill  . Ascorbic Acid (VITAMIN C) 1000 MG tablet Take 1,000 mg by mouth daily.    . clopidogrel (PLAVIX) 75 MG tablet TAKE 1 TABLET BY MOUTH DAILY WITH BREAKFAST. 90 tablet 1  . dapagliflozin propanediol (FARXIGA) 10 MG TABS tablet Take 10 mg by mouth daily with lunch.     . docusate sodium (COLACE) 100 MG capsule Take 1 capsule (100 mg total) by mouth 2 (two) times daily. 40 capsule 0  . glucose blood test strip 1 each by Other route as needed for other. Use as instructed    . lisinopril (PRINIVIL,ZESTRIL) 10 MG tablet Take 1 tablet (10 mg total) by mouth daily. (Patient taking differently: Take 10 mg by mouth daily with lunch.) 90 tablet 3  . metFORMIN (GLUCOPHAGE-XR) 500 MG 24 hr tablet Take 500 mg by mouth 2 (two) times daily.    . metFORMIN (GLUCOPHAGE-XR) 750 MG 24 hr tablet Take 750 mg by mouth daily.    Marland Kitchen  nitroGLYCERIN (NITROSTAT) 0.4 MG SL tablet Place 0.4 mg under the tongue every 5 (five) minutes as needed for chest pain.     . Omega-3 Fatty Acids (FISH OIL) 1000 MG CAPS Take 1,000 mg by mouth daily.     . simvastatin (ZOCOR) 20 MG tablet Take 1 tablet (20 mg total) by mouth at bedtime. 90 tablet 1  . sitaGLIPtin (JANUVIA) 100 MG tablet Take 100 mg by mouth daily.     No current facility-administered medications for this visit.    Allergies:   Patient has no known allergies.    Social History:  The patient  reports that he has never smoked. He has never used smokeless tobacco. He reports current alcohol use. He reports that he does not use drugs.   Family History:  The patient's family history includes Atrial fibrillation in his father; CVA in his paternal grandfather;  Coronary artery disease in his maternal grandfather; Diabetes in his paternal grandmother; Stroke in his paternal grandfather.    ROS:  Please see the history of present illness.   Otherwise, review of systems are positive for shoulder pain.   All other systems are reviewed and negative.    PHYSICAL EXAM: VS:  BP 134/86   Pulse 70   Ht 5\' 10"  (1.778 m)   Wt 234 lb 9.6 oz (106.4 kg)   SpO2 96%   BMI 33.66 kg/m  , BMI Body mass index is 33.66 kg/m. GEN: Well nourished, well developed, in no acute distress  HEENT: normal  Neck: no JVD, carotid bruits, or masses Cardiac: RRR; no murmurs, rubs, or gallops,no edema  Respiratory:  clear to auscultation bilaterally, normal work of breathing GI: soft, nontender, nondistended, + BS, obese MS: no deformity or atrophy  Skin: warm and dry, no rash Neuro:  Strength and sensation are intact Psych: euthymic mood, full affect   EKG:   The ekg ordered today demonstrates NSR, no ST changes   Recent Labs: 04/17/2020: BUN 18; Creatinine, Ser 0.79; Hemoglobin 13.0; Platelets 200; Potassium 3.7; Sodium 138   Lipid Panel No results found for: CHOL, TRIG, HDL, CHOLHDL, VLDL, LDLCALC, LDLDIRECT   Other studies Reviewed: Additional studies/ records that were reviewed today with results demonstrating: Labs reviewed.  He is due for repeat lipid check..   ASSESSMENT AND PLAN:  1. CAD: No angina.  Continue aggressive secondary prevention.   2. DM: A1C 8.6 in June 2021.  Still has high readings.  High with steroid shots for shoulder.  Healthy diet with lots of fiber.  This will be beneficial for diabetes and for his heart. 3. Hyperlipidemia: Will have lipids, liver checked in 3 months. Change simvastatin to rosuvastatin 20 mg daily. Will see if this change helps neck pain at all.  4. Erectile dysfunction: Used sildenafil in the past.  5. HTN: The current medical regimen is effective;  continue present plan and medications.     Current medicines are  reviewed at length with the patient today.  The patient concerns regarding his medicines were addressed.  The following changes have been made:  Stop simvastatin, start Crestor  Labs/ tests ordered today include:  No orders of the defined types were placed in this encounter.   Recommend 150 minutes/week of aerobic exercise Low fat, low carb, high fiber diet recommended  Disposition:   FU in 1 year   Signed, Larae Grooms, MD  01/14/2021 9:35 AM    [facility] [place], [place], [place]  [address]  Phone: [phone]; Fax: [phone]

## 2021-01-14 ENCOUNTER — Ambulatory Visit (INDEPENDENT_AMBULATORY_CARE_PROVIDER_SITE_OTHER): Payer: 59 | Admitting: Interventional Cardiology

## 2021-01-14 ENCOUNTER — Other Ambulatory Visit: Payer: Self-pay

## 2021-01-14 ENCOUNTER — Encounter: Payer: Self-pay | Admitting: Interventional Cardiology

## 2021-01-14 ENCOUNTER — Encounter: Payer: Self-pay | Admitting: *Deleted

## 2021-01-14 VITALS — BP 134/86 | HR 70 | Ht 70.0 in | Wt 234.6 lb

## 2021-01-14 DIAGNOSIS — I10 Essential (primary) hypertension: Secondary | ICD-10-CM | POA: Diagnosis not present

## 2021-01-14 DIAGNOSIS — N529 Male erectile dysfunction, unspecified: Secondary | ICD-10-CM

## 2021-01-14 DIAGNOSIS — E782 Mixed hyperlipidemia: Secondary | ICD-10-CM | POA: Diagnosis not present

## 2021-01-14 DIAGNOSIS — I25118 Atherosclerotic heart disease of native coronary artery with other forms of angina pectoris: Secondary | ICD-10-CM

## 2021-01-14 MED ORDER — CLOPIDOGREL BISULFATE 75 MG PO TABS: ORAL_TABLET | ORAL | 3 refills | Status: DC

## 2021-01-14 MED ORDER — LISINOPRIL 10 MG PO TABS: 10.0000 mg | ORAL_TABLET | Freq: Every day | ORAL | 3 refills | Status: DC

## 2021-01-14 MED ORDER — ROSUVASTATIN CALCIUM 20 MG PO TABS
20.0000 mg | ORAL_TABLET | Freq: Every day | ORAL | 3 refills | Status: DC
Start: 2021-01-14 — End: 2022-01-13

## 2021-01-14 NOTE — Patient Instructions (Signed)
Medication Instructions:  Your physician has recommended you make the following change in your medication: Stop Simvastatin. Start Rosuvastatin 20 mg by mouth daily  *If you need a refill on your cardiac medications before your next appointment, please call your pharmacy*   Lab Work: Your physician recommends that you return for lab work in: 3 months--June 17,2022.  Lipid and liver profiles.  This will be fasting.  The lab opens at 7:30 AM  If you have labs (blood work) drawn today and your tests are completely normal, you will receive your results only by: Marland Kitchen MyChart Message (if you have MyChart) OR . A paper copy in the mail If you have any lab test that is abnormal or we need to change your treatment, we will call you to review the results.   Testing/Procedures: none   Follow-Up: At [facility], you and your health needs are our priority.  As part of our continuing mission to provide you with exceptional heart care, we have created designated Provider Care Teams.  These Care Teams include your primary Cardiologist (physician) and Advanced Practice Providers (APPs -  Physician Assistants and Nurse Practitioners) who all work together to provide you with the care you need, when you need it.  We recommend signing up for the patient portal called "MyChart".  Sign up information is provided on this After Visit Summary.  MyChart is used to connect with patients for Virtual Visits (Telemedicine).  Patients are able to view lab/test results, encounter notes, upcoming appointments, etc.  Non-urgent messages can be sent to your provider as well.   To learn more about what you can do with MyChart, go to [email or web address].    Your next appointment:   12 month(s)  The format for your next appointment:   In Person  Provider:   You may see Larae Grooms, MD or one of the following Advanced Practice Providers on your designated Care Team:    Melina Copa, PA-C  Ermalinda Barrios,  PA-C    Other Instructions  High-Fiber Eating Plan Fiber, also called dietary fiber, is a type of carbohydrate. It is found foods such as fruits, vegetables, whole grains, and beans. A high-fiber diet can have many health benefits. Your health care provider may recommend a high-fiber diet to help:  Prevent constipation. Fiber can make your bowel movements more regular.  Lower your cholesterol.  Relieve the following conditions: ? Inflammation of veins in the anus (hemorrhoids). ? Inflammation of specific areas of the digestive tract (uncomplicated diverticulosis). ? A problem of the large intestine, also called the colon, that sometimes causes pain and diarrhea (irritable bowel syndrome, or IBS).  Prevent overeating as part of a weight-loss plan.  Prevent heart disease, type 2 diabetes, and certain cancers. What are tips for following this plan? Reading food labels  Check the nutrition facts label on food products for the amount of dietary fiber. Choose foods that have 5 grams of fiber or more per serving.  The goals for recommended daily fiber intake include: ? Men (age 82 or younger): 34-38 g. ? Men (over age 73): 28-34 g. ? Women (age 4 or younger): 25-28 g. ? Women (over age 80): 22-25 g. Your daily fiber goal is _____________ g.   Shopping  Choose whole fruits and vegetables instead of processed forms, such as apple juice or applesauce.  Choose a wide variety of high-fiber foods such as avocados, lentils, oats, and kidney beans.  Read the nutrition facts label of the foods you choose.  Be aware of foods with added fiber. These foods often have high sugar and sodium amounts per serving. Cooking  Use whole-grain flour for baking and cooking.  Cook with brown rice instead of white rice. Meal planning  Start the day with a breakfast that is high in fiber, such as a cereal that contains 5 g of fiber or more per serving.  Eat breads and cereals that are made with  whole-grain flour instead of refined flour or white flour.  Eat brown rice, bulgur wheat, or millet instead of white rice.  Use beans in place of meat in soups, salads, and pasta dishes.  Be sure that half of the grains you eat each day are whole grains. General information  You can get the recommended daily intake of dietary fiber by: ? Eating a variety of fruits, vegetables, grains, nuts, and beans. ? Taking a fiber supplement if you are not able to take in enough fiber in your diet. It is better to get fiber through food than from a supplement.  Gradually increase how much fiber you consume. If you increase your intake of dietary fiber too quickly, you may have bloating, cramping, or gas.  Drink plenty of water to help you digest fiber.  Choose high-fiber snacks, such as berries, raw vegetables, nuts, and popcorn. What foods should I eat? Fruits Berries. Pears. Apples. Oranges. Avocado. Prunes and raisins. Dried figs. Vegetables Sweet potatoes. Spinach. Kale. Artichokes. Cabbage. Broccoli. Cauliflower. Green peas. Carrots. Squash. Grains Whole-grain breads. Multigrain cereal. Oats and oatmeal. Brown rice. Barley. Bulgur wheat. [place]. Quinoa. Bran muffins. Popcorn. Rye wafer crackers. Meats and other proteins Navy beans, kidney beans, and pinto beans. Soybeans. Split peas. Lentils. Nuts and seeds. Dairy Fiber-fortified yogurt. Beverages Fiber-fortified soy milk. Fiber-fortified orange juice. Other foods Fiber bars. The items listed above may not be a complete list of recommended foods and beverages. Contact a dietitian for more information. What foods should I avoid? Fruits Fruit juice. Cooked, strained fruit. Vegetables Fried potatoes. Canned vegetables. Well-cooked vegetables. Grains White bread. Pasta made with refined flour. White rice. Meats and other proteins Fatty cuts of meat. Fried chicken or fried fish. Dairy Milk. Yogurt. Cream cheese. Sour cream. Fats and  oils Butters. Beverages Soft drinks. Other foods Cakes and pastries. The items listed above may not be a complete list of foods and beverages to avoid. Talk with your dietitian about what choices are best for you. Summary  Fiber is a type of carbohydrate. It is found in foods such as fruits, vegetables, whole grains, and beans.  A high-fiber diet has many benefits. It can help to prevent constipation, lower blood cholesterol, aid weight loss, and reduce your risk of heart disease, diabetes, and certain cancers.  Increase your intake of fiber gradually. Increasing fiber too quickly may cause cramping, bloating, and gas. Drink plenty of water while you increase the amount of fiber you consume.  The best sources of fiber include whole fruits and vegetables, whole grains, nuts, seeds, and beans. This information is not intended to replace advice given to you by your health care provider. Make sure you discuss any questions you have with your health care provider. Document Revised: 02/20/2020 Document Reviewed: 02/20/2020 Elsevier Patient Education  2021 [employer].

## 2021-04-06 ENCOUNTER — Ambulatory Visit: Payer: Self-pay | Admitting: Orthopedic Surgery

## 2021-04-06 ENCOUNTER — Telehealth: Payer: Self-pay | Admitting: *Deleted

## 2021-04-06 NOTE — Telephone Encounter (Signed)
   [place] [facility] Pre-operative Risk Assessment    Patient Name: Eddie Garner  DOB: [birth year]  MRN: [id number]   HEARTCARE STAFF: - Please ensure there is not already an duplicate clearance open for this procedure. - Under Visit Info/Reason for Call, type in Other and utilize the format Clearance MM/DD/YY or Clearance TBD. Do not use dashes or single digits. - If request is for dental extraction, please clarify the # of teeth to be extracted. - If the patient is currently at the dentist's office, call Pre-Op APP to address. If the patient is not currently in the dentist office, please route to the Pre-Op pool  Request for surgical clearance:  1. What type of surgery is being performed?  ACDF C4-7   2. When is this surgery scheduled?  TBD   3. What type of clearance is required (medical clearance vs. Pharmacy clearance to hold med vs. Both)?  BOTH  4. Are there any medications that need to be held prior to surgery and how long? PLAVIX   5. Practice name and name of physician performing surgery?  [facility]     6. What is the office phone number?  [phone]   7.   What is the office fax number?   [phone] ATTN:  SHERRY   8.   Anesthesia type (None, local, MAC, general) ?     Jeanann Lewandowsky 04/06/2021, 12:55 PM  _________________________________________________________________   (provider comments below)

## 2021-04-06 NOTE — Telephone Encounter (Signed)
Dr. Irish Lack  This patient is to undergo ACDF of C4--C7.  Surgical team asking for Plavix holding recommendations.  It appears the patient had a remote DES in 2009.  You most recently saw the patient 12/2020 at which time he was doing well with no CV symptoms.  He underwent exercise stress testing which was found to be normal.  Can you please send Plavix holding recommendations to the preop pool?  Thank you

## 2021-04-06 NOTE — Telephone Encounter (Signed)
OK to hold PLavix 5-7 days prior to surgery, based on surgeon's request.

## 2021-04-07 NOTE — Telephone Encounter (Signed)
    Eddie Garner DOB:  26-Nov-1960  MRN:  [id number]   Primary Cardiologist: Larae Grooms, MD  Chart reviewed as part of pre-operative protocol coverage. Given past medical history and time since last visit, based on ACC/AHA guidelines, Eddie Garner would be at acceptable risk for the planned procedure without further cardiovascular testing.   The patient was recently seen by Dr. Irish Lack 12/2020 with no anginal symptoms. He underwent an ETT that showed no ischemia or infarct. Per Dr. Irish Lack, the patient may hold Plavix for 5-7 days prior to procedure and resume once ok per surgical team.   The patient was advised that if he develops new symptoms prior to surgery to contact our office to arrange for a follow-up visit, and he verbalized understanding.  I will route this recommendation to the requesting party via Epic fax function and remove from pre-op pool.  Please call with questions.  Kathyrn Drown, NP 04/07/2021, 7:56 AM

## 2021-04-16 ENCOUNTER — Other Ambulatory Visit: Payer: 59 | Admitting: *Deleted

## 2021-04-16 ENCOUNTER — Other Ambulatory Visit: Payer: Self-pay

## 2021-04-16 DIAGNOSIS — E782 Mixed hyperlipidemia: Secondary | ICD-10-CM

## 2021-04-16 DIAGNOSIS — I25118 Atherosclerotic heart disease of native coronary artery with other forms of angina pectoris: Secondary | ICD-10-CM

## 2021-04-16 LAB — LIPID PANEL
Chol/HDL Ratio: 3.6 ratio (ref 0.0–5.0)
Cholesterol, Total: 86 mg/dL — ABNORMAL LOW (ref 100–199)
HDL: 24 mg/dL — ABNORMAL LOW (ref >39)
LDL Chol Calc (NIH): 41 mg/dL (ref 0–99)
Triglycerides: 114 mg/dL (ref 0–149)
VLDL Cholesterol Cal: 21 mg/dL (ref 5–40)

## 2021-04-16 LAB — HEPATIC FUNCTION PANEL
ALT: 16 IU/L (ref 0–44)
AST: 13 IU/L (ref 0–40)
Albumin: 4.8 g/dL (ref 3.8–4.9)
Alkaline Phosphatase: 111 IU/L (ref 44–121)
Bilirubin Total: 0.3 mg/dL (ref 0.0–1.2)
Bilirubin, Direct: 0.16 mg/dL (ref 0.00–0.40)
Total Protein: 6.6 g/dL (ref 6.0–8.5)

## 2021-04-26 ENCOUNTER — Ambulatory Visit: Payer: Self-pay | Admitting: Orthopedic Surgery

## 2021-04-26 NOTE — H&P (Signed)
Subjective:   Eddie Garner is is a pleasant 60 year old gentleman is had progressive neck and neuropathic left arm pain for over a year now. WC DOI- 09-02-19. He has undergone appropriate conservative care including over-the-counter medications, formalized physical therapy, and injection therapy. The patient states he had 4 days of improvement in his neck pain in radicular arm pain following the epidural injection. Despite these conservative treatments his overall quality-of-life his continue to deteriorate. After discussing risks, benefits, and alternatives to surgical intervention he elected to move forward with surgery. He is scheduled for ACDF C4-7 On 05/05/21 At [facility] With Dr. Rolena Infante.  Patient Active Problem List   Diagnosis Date Noted   Complete rotator cuff tear 04/16/2020   S/P thyroidectomy 11/19/2018   Type 2 diabetes mellitus with complication ([place]) 19/50/9326   Coronary arteriosclerosis 11/10/2014   Mixed hyperlipidemia 11/10/2014   Essential hypertension 11/10/2014   Past Medical History:  Diagnosis Date   Angina pectoris (HCC)    Coronary artery disease    Diabetes (HCC)    HTN (hypertension)    Hypercholesteremia    Hyperlipidemia    Obesity    Pain in joint    Right thyroid nodule     Past Surgical History:  Procedure Laterality Date   APPENDECTOMY     CARDIAC CATHETERIZATION     CORONARY ANGIOPLASTY     DES LAD 10/28/08 (Dr. Irish Lack, [facility])   [place] Left 07/09/2018   Dr. Caralyn Guile   FOOT SURGERY     HAND SURGERY Left 07/09/2018   Dr. Caralyn Guile    SHOULDER ARTHROSCOPY WITH ROTATOR CUFF REPAIR AND SUBACROMIAL DECOMPRESSION Left 04/16/2020   Procedure: SHOULDER ARTHROSCOPY WITH MINI OPEN ROTATOR CUFF REPAIR AND SUBACROMIAL DECOMPRESSION;  Surgeon: Susa Day, MD;  Location: [facility];  Service: Orthopedics;  Laterality: Left;  90 MINS   SHOULDER SURGERY Right 03/05/2018   Dr. Veverly Fells.    THYROID LOBECTOMY Right 11/19/2018   THYROID LOBECTOMY Right 11/19/2018    Procedure: RIGHT THYROID LOBECTOMY;  Surgeon: Ralene Ok, MD;  Location: [facility];  Service: General;  Laterality: Right;    Current Outpatient Medications  Medication Sig Dispense Refill Last Dose   clopidogrel (PLAVIX) 75 MG tablet TAKE 1 TABLET BY MOUTH DAILY WITH BREAKFAST. (Patient taking differently: Take 75 mg by mouth daily.) 90 tablet 3    dapagliflozin propanediol (FARXIGA) 10 MG TABS tablet Take 10 mg by mouth daily with lunch.       docusate sodium (COLACE) 100 MG capsule Take 1 capsule (100 mg total) by mouth 2 (two) times daily. (Patient not taking: Reported on 04/22/2021) 40 capsule 0    glucose blood test strip 1 each by Other route as needed for other. Use as instructed      lisinopril (ZESTRIL) 10 MG tablet Take 1 tablet (10 mg total) by mouth daily. 90 tablet 3    metFORMIN (GLUCOPHAGE) 500 MG tablet Take 1,000 mg by mouth 2 (two) times daily with a meal.      nitroGLYCERIN (NITROSTAT) 0.4 MG SL tablet Place 0.4 mg under the tongue every 5 (five) minutes as needed for chest pain.       Omega-3 Fatty Acids (FISH OIL) 1000 MG CAPS Take 1,000 mg by mouth daily.       rosuvastatin (CRESTOR) 20 MG tablet Take 1 tablet (20 mg total) by mouth daily. 90 tablet 3    sitaGLIPtin (JANUVIA) 100 MG tablet Take 100 mg by mouth daily.      TRESIBA FLEXTOUCH 100 UNIT/ML  FlexTouch Pen Inject 35 Units into the skin daily.      No current facility-administered medications for this visit.   No Known Allergies  Social History   Tobacco Use   Smoking status: Never   Smokeless tobacco: Never  Substance Use Topics   Alcohol use: Yes    Comment: rare    Family History  Problem Relation Age of Onset   Atrial fibrillation Father    Coronary artery disease Maternal Grandfather    Diabetes Paternal Grandmother    CVA Paternal Grandfather    Stroke Paternal Grandfather     Review of Systems Pertinent items are noted in HPI.  Objective:   Vitals: Ht: 5 ft 11 in 04/26/2021 01:25  pm BP: 130/90 sitting R arm 04/26/2021 01:28 pm Pulse: 74 bpm 04/26/2021 01:28 pm  Clinical exam: Patient is alert and oriented 3. No shortness of breath or chest pain. Heart: Regular rate and rhythm, no rubs, murmurs, or gallops Lungs: Clear auscultation bilaterally Abdomen: Soft and nontender. No loss of bowel and bladder control, no rebound tenderness Bowel sounds 4 Neck: Significant neck pain radiating predominantly into the left upper extremity. Patient describes dysesthesias primarily in the C6 and C7 dermatome. Positive occipital headaches. Decreased range of motion due to severe pain. Patient notes improvement in his neck pain with manual traction. Neuro: 5/5 motor strength in the upper extremity. Positive numbness and dysesthesias in the left C6 and C7 dermatome. Positive Spurling sign with reproduction of left radicular arm pain. 1+ deep tendon reflexes in the upper extremity bilaterally. Reflexes: Hoffman: Negative Imaging: The patient's cervical MRI and x-rays were reviewed. On his x-rays he has loss of normal cervical lordosis with 3 level degenerative disease C4-7. His MRI notes foraminal stenosis primarily C5-6 and C6-7. The left lateral recess/foraminal stenosis contributing to left C6 and C7 nerve compression.  Assessment:   Eddie Garner is is a pleasant 60 year old gentleman is had progressive neck and neuropathic left arm pain for over a year now. The patient states he had 4 days of improvement in his neck pain in radicular arm pain following the epidural injection. However his overall quality-of-life his continue to deteriorate. At this point time I believe his primary pain generator degenerated disc C4-7. Since he does have loss in lordosis, lateral recess/foraminal stenosis and neuropathic arm pain think the best course of action at this point is a 3 level ACDF. While the stenosis is not severe at C4-5, I do believe that not incorporating this into the surgical procedure would only  lead to rapid decline and ongoing pain that would work prior a revision fusion at the C4-5 level. Since there is degeneration at this level and a high likelihood of adjacent segment disease I am recommending a 3 level ACDF. Risks and benefits of surgery were discussed with the patient. These include: Infection, bleeding, death, stroke, paralysis, ongoing or worse pain, need for additional surgery, nonunion, leak of spinal fluid, adjacent segment degeneration requiring additional fusion surgery. Pseudoarthrosis (nonunion)requiring supplemental posterior fixation. Throat pain, swallowing difficulties, hoarseness or change in voice. The patient has expressed an understanding of these risks as well as a willingness to move forward with surgery.    Plan:   C4-7 ACDF  We will obtain preoperative medical clearance from his primary care provider as well as his cardiologist. He will hold his Plavix 7 days prior to surgery and resume 24 hours after. His last A1c was 7.5.  We have also discussed the post-operative recovery period to include:  bathing/showering restrictions, wound healing, activity (and driving) restrictions, medications/pain mangement.  We have also discussed post-operative redflags to include: signs and symptoms of postoperative infection, DVT/PE.  Discharge instructions reviewed with the patient and given to him at the end of today's office visit.  Brace fitting and external bone stimulator per work comp  Follow-up: 2 weeks postop

## 2021-04-26 NOTE — H&P (Deleted)
  The note originally documented on this encounter has been moved the the encounter in which it belongs.  

## 2021-04-30 NOTE — Progress Notes (Signed)
Surgical Instructions    Your procedure is scheduled on Wednesday, July 6th, 2022.  Report to [facility] Main Entrance "A" at 06:30 A.M., then check in with the Admitting office.  Call this number if you have problems the morning of surgery:  [phone]   If you have any questions prior to your surgery date call [phone]: Open Monday-Friday 8am-4pm    Remember:  Do not eat or drink after midnight the night before your surgery    Take these medicines the morning of surgery with A SIP OF WATER:  rosuvastatin (CRESTOR)  If needed:  nitroGLYCERIN (NITROSTAT)  Patient will hold his Plavix 7 days prior to surgery and resume 24 hours after per Dr. Melina Schools.  As of today, STOP taking any Aspirin (unless otherwise instructed by your surgeon) Aleve, Naproxen, Ibuprofen, Motrin, Advil, Goody's, BC's, all herbal medications, fish oil, and all vitamins.           WHAT DO I DO ABOUT MY DIABETES MEDICATION?  Hold dapagliflozin propanediol (FARXIGA) the day before surgery and the surgery day.  Do not take metFORMIN (GLUCOPHAGE) the day of surgery.  Do not take sitaGLIPtin (JANUVIA) the day of surgery.  The night before surgery take 17 units of TRESIBA FLEXTOUCH   The morning of surgery take 17 units of TRESIBA FLEXTOUCH    HOW TO MANAGE YOUR DIABETES BEFORE AND AFTER SURGERY  Why is it important to control my blood sugar before and after surgery? Improving blood sugar levels before and after surgery helps healing and can limit problems. A way of improving blood sugar control is eating a healthy diet by:  Eating less sugar and carbohydrates  Increasing activity/exercise  Talking with your doctor about reaching your blood sugar goals High blood sugars (greater than 180 mg/dL) can raise your risk of infections and slow your recovery, so you will need to focus on controlling your diabetes during the weeks before surgery. Make sure that the doctor who takes care of your  diabetes knows about your planned surgery including the date and location.  How do I manage my blood sugar before surgery? Check your blood sugar at least 4 times a day, starting 2 days before surgery, to make sure that the level is not too high or low.  Check your blood sugar the morning of your surgery when you wake up and every 2 hours until you get to the Short Stay unit.  If your blood sugar is less than 70 mg/dL, you will need to treat for low blood sugar: Do not take insulin. Treat a low blood sugar (less than 70 mg/dL) with  cup of clear juice (cranberry or apple), 4 glucose tablets, OR glucose gel. Recheck blood sugar in 15 minutes after treatment (to make sure it is greater than 70 mg/dL). If your blood sugar is not greater than 70 mg/dL on recheck, call [phone] for further instructions. Report your blood sugar to the short stay nurse when you get to Short Stay.  If you are admitted to the hospital after surgery: Your blood sugar will be checked by the staff and you will probably be given insulin after surgery (instead of oral diabetes medicines) to make sure you have good blood sugar levels. The goal for blood sugar control after surgery is 80-180 mg/dL.  Do not wear jewelry  Do not wear lotions, powders, colognes, or deodorant. Men may shave face and neck. Do not bring valuables to the hospital. DO Not wear nail polish, gel polish,  artificial nails, or any other type of covering on natural nails including finger and toenails. If patients have artificial nails, gel coating, etc. that need to be removed by a nail salon please have this removed prior to surgery or surgery may need to be canceled/delayed if the surgeon/ anesthesia feels like the patient is unable to be adequately monitored.             [facility] is not responsible for any belongings or valuables.  Do NOT Smoke (Tobacco/Vaping) or drink Alcohol 24 hours prior to your procedure If you use a CPAP at night, you  may bring all equipment for your overnight stay.   Contacts, glasses, dentures or bridgework may not be worn into surgery, please bring cases for these belongings   For patients admitted to the hospital, discharge time will be determined by your treatment team.   Patients discharged the day of surgery will not be allowed to drive home, and someone needs to stay with them for 24 hours.  ONLY 1 SUPPORT PERSON MAY BE PRESENT WHILE YOU ARE IN SURGERY. IF YOU ARE TO BE ADMITTED ONCE YOU ARE IN YOUR ROOM YOU WILL BE ALLOWED TWO (2) VISITORS.  Minor children may have two parents present. Special consideration for safety and communication needs will be reviewed on a case by case basis.  Special instructions:    Oral Hygiene is also important to reduce your risk of infection.  Remember - BRUSH YOUR TEETH THE MORNING OF SURGERY WITH YOUR REGULAR TOOTHPASTE   [facility]- Preparing For Surgery  Before surgery, you can play an important role. Because skin is not sterile, your skin needs to be as free of germs as possible. You can reduce the number of germs on your skin by washing with CHG (chlorahexidine gluconate) Soap before surgery.  CHG is an antiseptic cleaner which kills germs and bonds with the skin to continue killing germs even after washing.     Please do not use if you have an allergy to CHG or antibacterial soaps. If your skin becomes reddened/irritated stop using the CHG.  Do not shave (including legs and underarms) for at least 48 hours prior to first CHG shower. It is OK to shave your face.  Please follow these instructions carefully.     Shower the NIGHT BEFORE SURGERY and the MORNING OF SURGERY with CHG Soap.   If you chose to wash your hair, wash your hair first as usual with your normal shampoo. After you shampoo, rinse your hair and body thoroughly to remove the shampoo.  Then [employer] and genitals (private parts) with your normal soap and rinse thoroughly to remove soap.  After  that Use CHG Soap as you would any other liquid soap. You can apply CHG directly to the skin and wash gently with a scrungie or a clean washcloth.   Apply the CHG Soap to your body ONLY FROM THE NECK DOWN.  Do not use on open wounds or open sores. Avoid contact with your eyes, ears, mouth and genitals (private parts). Wash Face and genitals (private parts)  with your normal soap.   Wash thoroughly, paying special attention to the area where your surgery will be performed.  Thoroughly rinse your body with warm water from the neck down.  DO NOT shower/wash with your normal soap after using and rinsing off the CHG Soap.  Pat yourself dry with a CLEAN TOWEL.  Wear CLEAN PAJAMAS to bed the night before surgery  Place  CLEAN SHEETS on your bed the night before your surgery  DO NOT SLEEP WITH PETS.   Day of Surgery:  Take a shower with CHG soap. Wear Clean/Comfortable clothing the morning of surgery Do not apply any deodorants/lotions.   Remember to brush your teeth WITH YOUR REGULAR TOOTHPASTE.   Please read over the following fact sheets that you were given.

## 2021-05-04 ENCOUNTER — Other Ambulatory Visit: Payer: Self-pay

## 2021-05-04 ENCOUNTER — Encounter (HOSPITAL_COMMUNITY)
Admission: RE | Admit: 2021-05-04 | Discharge: 2021-05-04 | Disposition: A | Discharge status: Home / Self Care | Payer: No Typology Code available for payment source | Source: Ambulatory Visit | Attending: Orthopedic Surgery | Admitting: Orthopedic Surgery

## 2021-05-04 ENCOUNTER — Ambulatory Visit (HOSPITAL_COMMUNITY)
Admission: RE | Admit: 2021-05-04 | Discharge: 2021-05-04 | Disposition: A | Discharge status: Home / Self Care | Payer: No Typology Code available for payment source | Source: Ambulatory Visit | Attending: Orthopedic Surgery | Admitting: Orthopedic Surgery

## 2021-05-04 ENCOUNTER — Encounter (HOSPITAL_COMMUNITY): Payer: Self-pay

## 2021-05-04 DIAGNOSIS — Z01818 Encounter for other preprocedural examination: Secondary | ICD-10-CM

## 2021-05-04 DIAGNOSIS — Z20822 Contact with and (suspected) exposure to covid-19: Secondary | ICD-10-CM | POA: Insufficient documentation

## 2021-05-04 HISTORY — DX: Pneumonia, unspecified organism: J18.9

## 2021-05-04 LAB — URINALYSIS, ROUTINE W REFLEX MICROSCOPIC
Bacteria, UA: NONE SEEN
Bilirubin Urine: NEGATIVE
Glucose, UA: >=500 mg/dL — AB
Hgb urine dipstick: NEGATIVE
Ketones, ur: 5 mg/dL — AB
Leukocytes,Ua: NEGATIVE
Nitrite: NEGATIVE
Protein, ur: NEGATIVE mg/dL
Specific Gravity, Urine: 1.029 (ref 1.005–1.030)
pH: 5 (ref 5.0–8.0)

## 2021-05-04 LAB — CBC
HCT: 42.5 % (ref 39.0–52.0)
Hemoglobin: 14.2 g/dL (ref 13.0–17.0)
MCH: 31 pg (ref 26.0–34.0)
MCHC: 33.4 g/dL (ref 30.0–36.0)
MCV: 92.8 fL (ref 80.0–100.0)
Platelets: 226 10*3/uL (ref 150–400)
RBC: 4.58 MIL/uL (ref 4.22–5.81)
RDW: 12.6 % (ref 11.5–15.5)
WBC: 9 10*3/uL (ref 4.0–10.5)
nRBC: 0 % (ref 0.0–0.2)

## 2021-05-04 LAB — BASIC METABOLIC PANEL
Anion gap: 7 (ref 5–15)
BUN: 25 mg/dL — ABNORMAL HIGH (ref 6–20)
CO2: 23 mmol/L (ref 22–32)
Calcium: 8.8 mg/dL — ABNORMAL LOW (ref 8.9–10.3)
Chloride: 109 mmol/L (ref 98–111)
Creatinine, Ser: 0.87 mg/dL (ref 0.61–1.24)
GFR, Estimated: >60 mL/min (ref >60)
Glucose, Bld: 158 mg/dL — ABNORMAL HIGH (ref 70–99)
Potassium: 3.9 mmol/L (ref 3.5–5.1)
Sodium: 139 mmol/L (ref 135–145)

## 2021-05-04 LAB — GLUCOSE, CAPILLARY: Glucose-Capillary: 160 mg/dL — ABNORMAL HIGH (ref 70–99)

## 2021-05-04 LAB — TYPE AND SCREEN
ABO/RH(D): O POS
Antibody Screen: NEGATIVE

## 2021-05-04 LAB — PROTIME-INR
INR: 1 (ref 0.8–1.2)
Prothrombin Time: 13.2 seconds (ref 11.4–15.2)

## 2021-05-04 LAB — SURGICAL PCR SCREEN
MRSA, PCR: NEGATIVE
Staphylococcus aureus: POSITIVE — AB

## 2021-05-04 LAB — APTT: aPTT: 31 seconds (ref 24–36)

## 2021-05-04 LAB — SARS CORONAVIRUS 2 (TAT 6-24 HRS): SARS Coronavirus 2: NEGATIVE

## 2021-05-04 NOTE — Anesthesia Preprocedure Evaluation (Addendum)
Anesthesia Evaluation  Patient identified by MRN, date of birth, ID band Patient awake    Reviewed: Allergy & Precautions, NPO status , Patient's Chart, lab work & pertinent test results  Airway Mallampati: II  TM Distance: >3 FB Neck ROM: Full    Dental no notable dental hx.    Pulmonary neg pulmonary ROS,    Pulmonary exam normal breath sounds clear to auscultation       Cardiovascular Exercise Tolerance: Good hypertension, + angina + CAD  Normal cardiovascular exam Rhythm:Regular Rate:Normal     Neuro/Psych negative neurological ROS  negative psych ROS   GI/Hepatic negative GI ROS, Neg liver ROS,   Endo/Other  diabetes  Renal/GU negative Renal ROS  negative genitourinary   Musculoskeletal negative musculoskeletal ROS (+)   Abdominal   Peds negative pediatric ROS (+)  Hematology negative hematology ROS (+)   Anesthesia Other Findings   Reproductive/Obstetrics negative OB ROS                           Anesthesia Physical Anesthesia Plan  ASA: 3  Anesthesia Plan: General   Post-op Pain Management:    Induction: Intravenous  PONV Risk Score and Plan: 2 and Treatment may vary due to age or medical condition, Ondansetron, Dexamethasone and Midazolam  Airway Management Planned: Oral ETT  Additional Equipment: None  Intra-op Plan:   Post-operative Plan: Extubation in OR  Informed Consent:   Plan Discussed with:   Anesthesia Plan Comments: (PAT note by Eddie Caldwell, PA-C: Follows with cardiology for history of CAD s/p DES to LAD in 2009.  Negative exercise nuclear stress 01/20/2021.  Last seen by Dr. Irish Lack 01/14/2021.  No angina at that time, recommended to continue aggressive secondary prevention.  Clearance per telephone encounter 04/07/2021, "Chart reviewed as part of pre-operative protocol coverage. Given past medical history and time since last visit, based on ACC/AHA  guidelines,Eddie V Dillonwould be at acceptable risk for the planned procedure without further cardiovascular testing. The patient was recently seen by Dr. Irish Lack 12/2020 with no anginal symptoms. He underwent an ETT that showed no ischemia or infarct. Per Dr. Irish Lack, the patient may hold Plavix for 5-7 days prior to procedure and resume once ok per surgical team."  IDDM 2, last A1c 7.5 on 04/12/2021.  Preop labs reviewed, unremarkable.  EKG 01/14/2021: Sinus rhythm.  Rate 70.  Exercise nuclear stress 12/23/2020: . The left ventricular ejection fraction is normal (55-65%). . Nuclear stress EF: 56%. . Blood pressure demonstrated a hypertensive response to exercise. Marland Kitchen Horizontal ST segment depression ST segment depression was noted during stress in the II, III, aVF, V5 and V6 leads, beginning at 9 minutes of stress, and returning to baseline after 1-5 minutes of recovery. 1.5 mm . The study is normal. . This is a low risk study. )       Anesthesia Quick Evaluation

## 2021-05-04 NOTE — Progress Notes (Signed)
Anesthesia Chart Review:  Follows with cardiology for history of CAD s/p DES to LAD in 2009.  Negative exercise nuclear stress 01/20/2021.  Last seen by Dr. Irish Lack 01/14/2021.  No angina at that time, recommended to continue aggressive secondary prevention.  Clearance per telephone encounter 04/07/2021, "Chart reviewed as part of pre-operative protocol coverage. Given past medical history and time since last visit, based on ACC/AHA guidelines, AHMON TOSI would be at acceptable risk for the planned procedure without further cardiovascular testing. The patient was recently seen by Dr. Irish Lack 12/2020 with no anginal symptoms. He underwent an ETT that showed no ischemia or infarct. Per Dr. Irish Lack, the patient may hold Plavix for 5-7 days prior to procedure and resume once ok per surgical team."  IDDM 2, last A1c 7.5 on 04/12/2021.  Preop labs reviewed, unremarkable.  EKG 01/14/2021: Sinus rhythm.  Rate 70.  Exercise nuclear stress 12/23/2020: The left ventricular ejection fraction is normal (55-65%). Nuclear stress EF: 56%. Blood pressure demonstrated a hypertensive response to exercise. Horizontal ST segment depression ST segment depression was noted during stress in the II, III, aVF, V5 and V6 leads, beginning at 9 minutes of stress, and returning to baseline after 1-5 minutes of recovery. 1.5 mm The study is normal. This is a low risk study.  Wynonia Musty [facility] Short Stay Center/Anesthesiology Phone [phone] 05/04/2021 2:06 PM

## 2021-05-04 NOTE — Progress Notes (Signed)
PCP - [place]  Cardiologist - Irish Lack Received cardiac clearance - see notes  Chest x-ray - 05/04/21 EKG - 01/14/21 Stress Test - 12/23/20 Cardiac Cath - 10/28/08  DM - Type 2  Fasting Blood Sugar - varies, pt stated he does not check every day  Blood Thinner Instructions: Hold plavix 5-7- days prior to surgery  COVID TEST- 05/04/21   Anesthesia review: yes, heart history  Patient denies shortness of breath, fever, cough and chest pain at PAT appointment   All instructions explained to the patient, with a verbal understanding of the material. Patient agrees to go over the instructions while at home for a better understanding. Patient also instructed to self quarantine after being tested for COVID-19. The opportunity to ask questions was provided.

## 2021-05-05 ENCOUNTER — Ambulatory Visit (HOSPITAL_COMMUNITY): Payer: No Typology Code available for payment source | Admitting: Physician Assistant

## 2021-05-05 ENCOUNTER — Inpatient Hospital Stay (HOSPITAL_COMMUNITY)
Admission: RE | Admit: 2021-05-05 | Discharge: 2021-05-07 | DRG: 030 | Disposition: A | Discharge status: Home / Self Care | Payer: No Typology Code available for payment source | Attending: Orthopedic Surgery | Admitting: Orthopedic Surgery

## 2021-05-05 ENCOUNTER — Ambulatory Visit (HOSPITAL_COMMUNITY): Payer: No Typology Code available for payment source

## 2021-05-05 ENCOUNTER — Encounter (HOSPITAL_COMMUNITY): Payer: Self-pay | Admitting: Orthopedic Surgery

## 2021-05-05 ENCOUNTER — Inpatient Hospital Stay (HOSPITAL_COMMUNITY)
Admission: RE | Disposition: A | Discharge status: Home / Self Care | Payer: Self-pay | Source: Home / Self Care | Attending: Orthopedic Surgery

## 2021-05-05 DIAGNOSIS — M4322 Fusion of spine, cervical region: Secondary | ICD-10-CM | POA: Diagnosis present

## 2021-05-05 DIAGNOSIS — Z7902 Long term (current) use of antithrombotics/antiplatelets: Secondary | ICD-10-CM

## 2021-05-05 DIAGNOSIS — I10 Essential (primary) hypertension: Secondary | ICD-10-CM | POA: Diagnosis present

## 2021-05-05 DIAGNOSIS — M5412 Radiculopathy, cervical region: Principal | ICD-10-CM | POA: Diagnosis present

## 2021-05-05 DIAGNOSIS — Z823 Family history of stroke: Secondary | ICD-10-CM

## 2021-05-05 DIAGNOSIS — X58XXXA Exposure to other specified factors, initial encounter: Secondary | ICD-10-CM | POA: Diagnosis present

## 2021-05-05 DIAGNOSIS — E890 Postprocedural hypothyroidism: Secondary | ICD-10-CM | POA: Diagnosis present

## 2021-05-05 DIAGNOSIS — Z79899 Other long term (current) drug therapy: Secondary | ICD-10-CM

## 2021-05-05 DIAGNOSIS — E782 Mixed hyperlipidemia: Secondary | ICD-10-CM | POA: Diagnosis present

## 2021-05-05 DIAGNOSIS — E118 Type 2 diabetes mellitus with unspecified complications: Secondary | ICD-10-CM | POA: Diagnosis present

## 2021-05-05 DIAGNOSIS — M4802 Spinal stenosis, cervical region: Secondary | ICD-10-CM | POA: Diagnosis present

## 2021-05-05 DIAGNOSIS — M4320 Fusion of spine, site unspecified: Secondary | ICD-10-CM | POA: Diagnosis present

## 2021-05-05 DIAGNOSIS — Z419 Encounter for procedure for purposes other than remedying health state, unspecified: Secondary | ICD-10-CM

## 2021-05-05 DIAGNOSIS — I2510 Atherosclerotic heart disease of native coronary artery without angina pectoris: Secondary | ICD-10-CM | POA: Diagnosis present

## 2021-05-05 DIAGNOSIS — E669 Obesity, unspecified: Secondary | ICD-10-CM | POA: Diagnosis present

## 2021-05-05 DIAGNOSIS — Z833 Family history of diabetes mellitus: Secondary | ICD-10-CM

## 2021-05-05 DIAGNOSIS — Z7984 Long term (current) use of oral hypoglycemic drugs: Secondary | ICD-10-CM

## 2021-05-05 DIAGNOSIS — Z8249 Family history of ischemic heart disease and other diseases of the circulatory system: Secondary | ICD-10-CM

## 2021-05-05 DIAGNOSIS — Z9861 Coronary angioplasty status: Secondary | ICD-10-CM

## 2021-05-05 DIAGNOSIS — Y990 Civilian activity done for income or pay: Secondary | ICD-10-CM

## 2021-05-05 HISTORY — PX: ANTERIOR CERVICAL DECOMP/DISCECTOMY FUSION: SHX1161

## 2021-05-05 LAB — GLUCOSE, CAPILLARY
Glucose-Capillary: 146 mg/dL — ABNORMAL HIGH (ref 70–99)
Glucose-Capillary: 171 mg/dL — ABNORMAL HIGH (ref 70–99)
Glucose-Capillary: 181 mg/dL — ABNORMAL HIGH (ref 70–99)
Glucose-Capillary: 176 mg/dL — ABNORMAL HIGH (ref 70–99)

## 2021-05-05 LAB — ABO/RH: ABO/RH(D): O POS

## 2021-05-05 SURGERY — ANTERIOR CERVICAL DECOMPRESSION/DISCECTOMY FUSION 3 LEVELS
Anesthesia: General

## 2021-05-05 MED ORDER — SODIUM CHLORIDE 0.9 % IV SOLN: 250.0000 mL | INTRAVENOUS | Status: DC

## 2021-05-05 MED ORDER — OXYCODONE-ACETAMINOPHEN 10-325 MG PO TABS: 1.0000 | ORAL_TABLET | Freq: Four times a day (QID) | ORAL | 0 refills | Status: AC | PRN

## 2021-05-05 MED ORDER — ONDANSETRON HCL 4 MG PO TABS: 4.0000 mg | ORAL_TABLET | Freq: Three times a day (TID) | ORAL | 0 refills | Status: DC | PRN

## 2021-05-05 MED ORDER — ACETAMINOPHEN 500 MG PO TABS: 1000.0000 mg | ORAL_TABLET | Freq: Once | ORAL | Status: AC

## 2021-05-05 MED ORDER — ONDANSETRON HCL 4 MG/2ML IJ SOLN
4.0000 mg | Freq: Four times a day (QID) | INTRAMUSCULAR | Status: DC | PRN
  Administered 2021-05-06: 4 mg via INTRAVENOUS
  Filled 2021-05-05: qty 2

## 2021-05-05 MED ORDER — THROMBIN 20000 UNITS EX SOLR
CUTANEOUS | Status: DC | PRN
  Administered 2021-05-05: 20 mL via TOPICAL

## 2021-05-05 MED ORDER — PHENYLEPHRINE 40 MCG/ML (10ML) SYRINGE FOR IV PUSH (FOR BLOOD PRESSURE SUPPORT)
PREFILLED_SYRINGE | INTRAVENOUS | Status: DC | PRN
  Administered 2021-05-05: 80 ug via INTRAVENOUS

## 2021-05-05 MED ORDER — SODIUM CHLORIDE 0.9% FLUSH: 3.0000 mL | INTRAVENOUS | Status: DC | PRN

## 2021-05-05 MED ORDER — THROMBIN 20000 UNITS EX KIT
PACK | CUTANEOUS | Status: AC
  Filled 2021-05-05: qty 1

## 2021-05-05 MED ORDER — PHENOL 1.4 % MT LIQD: 1.0000 | OROMUCOSAL | Status: DC | PRN

## 2021-05-05 MED ORDER — ONDANSETRON HCL 4 MG/2ML IJ SOLN
INTRAMUSCULAR | Status: AC
  Filled 2021-05-05: qty 2

## 2021-05-05 MED ORDER — METHOCARBAMOL 1000 MG/10ML IJ SOLN
500.0000 mg | Freq: Four times a day (QID) | INTRAVENOUS | Status: DC | PRN
  Filled 2021-05-05: qty 5

## 2021-05-05 MED ORDER — FENTANYL CITRATE (PF) 100 MCG/2ML IJ SOLN
INTRAMUSCULAR | Status: AC
  Filled 2021-05-05: qty 2

## 2021-05-05 MED ORDER — LACTATED RINGERS IV SOLN: INTRAVENOUS | Status: DC

## 2021-05-05 MED ORDER — LINAGLIPTIN 5 MG PO TABS
5.0000 mg | ORAL_TABLET | Freq: Every day | ORAL | Status: DC
  Administered 2021-05-05 – 2021-05-06 (×2): 5 mg via ORAL
  Filled 2021-05-05 (×3): qty 1

## 2021-05-05 MED ORDER — DEXAMETHASONE SODIUM PHOSPHATE 10 MG/ML IJ SOLN
INTRAMUSCULAR | Status: AC
  Filled 2021-05-05: qty 1

## 2021-05-05 MED ORDER — CHLORHEXIDINE GLUCONATE 0.12 % MT SOLN: 15.0000 mL | Freq: Once | OROMUCOSAL | Status: AC

## 2021-05-05 MED ORDER — FLEET ENEMA 7-19 GM/118ML RE ENEM: 1.0000 | ENEMA | Freq: Once | RECTAL | Status: DC | PRN

## 2021-05-05 MED ORDER — LACTATED RINGERS IV SOLN: INTRAVENOUS | Status: DC | PRN

## 2021-05-05 MED ORDER — POLYETHYLENE GLYCOL 3350 17 G PO PACK
17.0000 g | PACK | Freq: Every day | ORAL | Status: DC | PRN
  Administered 2021-05-06: 17 g via ORAL
  Filled 2021-05-05: qty 1

## 2021-05-05 MED ORDER — LABETALOL HCL 5 MG/ML IV SOLN
INTRAVENOUS | Status: AC
  Filled 2021-05-05: qty 4

## 2021-05-05 MED ORDER — ONDANSETRON HCL 4 MG PO TABS: 4.0000 mg | ORAL_TABLET | Freq: Four times a day (QID) | ORAL | Status: DC | PRN

## 2021-05-05 MED ORDER — SUGAMMADEX SODIUM 200 MG/2ML IV SOLN
INTRAVENOUS | Status: DC | PRN
  Administered 2021-05-05: 200 mg via INTRAVENOUS

## 2021-05-05 MED ORDER — FENTANYL CITRATE (PF) 100 MCG/2ML IJ SOLN
25.0000 ug | INTRAMUSCULAR | Status: DC | PRN
  Administered 2021-05-05 (×3): 50 ug via INTRAVENOUS

## 2021-05-05 MED ORDER — CEFAZOLIN SODIUM-DEXTROSE 2-4 GM/100ML-% IV SOLN
2.0000 g | INTRAVENOUS | Status: AC
  Administered 2021-05-05 (×2): 2 g via INTRAVENOUS

## 2021-05-05 MED ORDER — SODIUM CHLORIDE 0.9% FLUSH
3.0000 mL | Freq: Two times a day (BID) | INTRAVENOUS | Status: DC
  Administered 2021-05-06 (×2): 3 mL via INTRAVENOUS

## 2021-05-05 MED ORDER — ROCURONIUM BROMIDE 10 MG/ML (PF) SYRINGE
PREFILLED_SYRINGE | INTRAVENOUS | Status: AC
  Filled 2021-05-05: qty 10

## 2021-05-05 MED ORDER — PROMETHAZINE HCL 25 MG/ML IJ SOLN: 6.2500 mg | INTRAMUSCULAR | Status: DC | PRN

## 2021-05-05 MED ORDER — METHOCARBAMOL 500 MG PO TABS: 500.0000 mg | ORAL_TABLET | Freq: Three times a day (TID) | ORAL | 0 refills | Status: AC | PRN

## 2021-05-05 MED ORDER — METHOCARBAMOL 500 MG PO TABS
500.0000 mg | ORAL_TABLET | Freq: Four times a day (QID) | ORAL | Status: DC | PRN
  Administered 2021-05-06 (×2): 500 mg via ORAL
  Filled 2021-05-05 (×2): qty 1

## 2021-05-05 MED ORDER — OXYCODONE HCL 5 MG PO TABS
10.0000 mg | ORAL_TABLET | ORAL | Status: DC | PRN
  Administered 2021-05-05 – 2021-05-07 (×10): 10 mg via ORAL
  Filled 2021-05-05 (×10): qty 2

## 2021-05-05 MED ORDER — LIDOCAINE 2% (20 MG/ML) 5 ML SYRINGE
INTRAMUSCULAR | Status: AC
  Filled 2021-05-05: qty 5

## 2021-05-05 MED ORDER — LABETALOL HCL 5 MG/ML IV SOLN
10.0000 mg | Freq: Once | INTRAVENOUS | Status: AC
  Administered 2021-05-05: 10 mg via INTRAVENOUS

## 2021-05-05 MED ORDER — FENTANYL CITRATE (PF) 250 MCG/5ML IJ SOLN
INTRAMUSCULAR | Status: DC | PRN
  Administered 2021-05-05 (×3): 50 ug via INTRAVENOUS
  Administered 2021-05-05: 100 ug via INTRAVENOUS
  Administered 2021-05-05 (×2): 50 ug via INTRAVENOUS

## 2021-05-05 MED ORDER — ACETAMINOPHEN 500 MG PO TABS
ORAL_TABLET | ORAL | Status: AC
  Administered 2021-05-05: 1000 mg via ORAL
  Filled 2021-05-05: qty 2

## 2021-05-05 MED ORDER — OXYCODONE HCL 5 MG/5ML PO SOLN: 5.0000 mg | Freq: Once | ORAL | Status: AC | PRN

## 2021-05-05 MED ORDER — DEXAMETHASONE SODIUM PHOSPHATE 10 MG/ML IJ SOLN
INTRAMUSCULAR | Status: DC | PRN
  Administered 2021-05-05: 10 mg via INTRAVENOUS

## 2021-05-05 MED ORDER — DROPERIDOL 2.5 MG/ML IJ SOLN: 0.6250 mg | Freq: Once | INTRAMUSCULAR | Status: DC | PRN

## 2021-05-05 MED ORDER — NITROGLYCERIN 0.4 MG SL SUBL: 0.4000 mg | SUBLINGUAL_TABLET | SUBLINGUAL | Status: DC | PRN

## 2021-05-05 MED ORDER — ROCURONIUM BROMIDE 10 MG/ML (PF) SYRINGE
PREFILLED_SYRINGE | INTRAVENOUS | Status: DC | PRN
  Administered 2021-05-05: 30 mg via INTRAVENOUS
  Administered 2021-05-05: 20 mg via INTRAVENOUS
  Administered 2021-05-05: 30 mg via INTRAVENOUS
  Administered 2021-05-05: 70 mg via INTRAVENOUS
  Administered 2021-05-05: 30 mg via INTRAVENOUS

## 2021-05-05 MED ORDER — PROPOFOL 10 MG/ML IV BOLUS
INTRAVENOUS | Status: AC
  Filled 2021-05-05: qty 20

## 2021-05-05 MED ORDER — OXYCODONE HCL 5 MG PO TABS
ORAL_TABLET | ORAL | Status: AC
  Filled 2021-05-05: qty 1

## 2021-05-05 MED ORDER — CEFAZOLIN SODIUM 1 G IJ SOLR
INTRAMUSCULAR | Status: AC
  Filled 2021-05-05: qty 20

## 2021-05-05 MED ORDER — CEFAZOLIN SODIUM-DEXTROSE 1-4 GM/50ML-% IV SOLN
1.0000 g | Freq: Three times a day (TID) | INTRAVENOUS | Status: AC
  Administered 2021-05-05 – 2021-05-06 (×2): 1 g via INTRAVENOUS
  Filled 2021-05-05 (×2): qty 50

## 2021-05-05 MED ORDER — MIDAZOLAM HCL 2 MG/2ML IJ SOLN
INTRAMUSCULAR | Status: AC
  Filled 2021-05-05: qty 2

## 2021-05-05 MED ORDER — INSULIN ASPART 100 UNIT/ML IJ SOLN
0.0000 [IU] | Freq: Three times a day (TID) | INTRAMUSCULAR | Status: DC
  Administered 2021-05-05 – 2021-05-06 (×2): 3 [IU] via SUBCUTANEOUS
  Administered 2021-05-06: 2 [IU] via SUBCUTANEOUS
  Administered 2021-05-06 – 2021-05-07 (×2): 3 [IU] via SUBCUTANEOUS

## 2021-05-05 MED ORDER — LIDOCAINE 2% (20 MG/ML) 5 ML SYRINGE
INTRAMUSCULAR | Status: DC | PRN
  Administered 2021-05-05: 100 mg via INTRAVENOUS

## 2021-05-05 MED ORDER — MIDAZOLAM HCL 5 MG/5ML IJ SOLN
INTRAMUSCULAR | Status: DC | PRN
  Administered 2021-05-05: 2 mg via INTRAVENOUS

## 2021-05-05 MED ORDER — DAPAGLIFLOZIN PROPANEDIOL 10 MG PO TABS
10.0000 mg | ORAL_TABLET | Freq: Every day | ORAL | Status: DC
  Administered 2021-05-06: 10 mg via ORAL
  Filled 2021-05-05 (×2): qty 1

## 2021-05-05 MED ORDER — FENTANYL CITRATE (PF) 250 MCG/5ML IJ SOLN
INTRAMUSCULAR | Status: AC
  Filled 2021-05-05: qty 5

## 2021-05-05 MED ORDER — METHOCARBAMOL 500 MG PO TABS
ORAL_TABLET | ORAL | Status: AC
  Filled 2021-05-05: qty 1

## 2021-05-05 MED ORDER — LISINOPRIL 10 MG PO TABS
10.0000 mg | ORAL_TABLET | Freq: Every day | ORAL | Status: DC
  Administered 2021-05-05 – 2021-05-06 (×2): 10 mg via ORAL
  Filled 2021-05-05 (×2): qty 1

## 2021-05-05 MED ORDER — ACETAMINOPHEN 325 MG PO TABS
650.0000 mg | ORAL_TABLET | ORAL | Status: DC | PRN
  Administered 2021-05-05 – 2021-05-07 (×4): 650 mg via ORAL
  Filled 2021-05-05 (×4): qty 2

## 2021-05-05 MED ORDER — EPHEDRINE SULFATE-NACL 50-0.9 MG/10ML-% IV SOSY
PREFILLED_SYRINGE | INTRAVENOUS | Status: DC | PRN
  Administered 2021-05-05: 10 mg via INTRAVENOUS
  Administered 2021-05-05: 5 mg via INTRAVENOUS
  Administered 2021-05-05: 10 mg via INTRAVENOUS

## 2021-05-05 MED ORDER — ONDANSETRON HCL 4 MG/2ML IJ SOLN
INTRAMUSCULAR | Status: DC | PRN
  Administered 2021-05-05: 4 mg via INTRAVENOUS

## 2021-05-05 MED ORDER — METFORMIN HCL 500 MG PO TABS
1000.0000 mg | ORAL_TABLET | Freq: Two times a day (BID) | ORAL | Status: DC
  Administered 2021-05-05 – 2021-05-07 (×4): 1000 mg via ORAL
  Filled 2021-05-05 (×4): qty 2

## 2021-05-05 MED ORDER — MORPHINE SULFATE (PF) 2 MG/ML IV SOLN
2.0000 mg | INTRAVENOUS | Status: DC | PRN
  Administered 2021-05-05: 2 mg via INTRAVENOUS
  Filled 2021-05-05: qty 1

## 2021-05-05 MED ORDER — ORAL CARE MOUTH RINSE: 15.0000 mL | Freq: Once | OROMUCOSAL | Status: AC

## 2021-05-05 MED ORDER — OXYCODONE HCL 5 MG PO TABS: 5.0000 mg | ORAL_TABLET | ORAL | Status: DC | PRN

## 2021-05-05 MED ORDER — BUPIVACAINE-EPINEPHRINE 0.25% -1:200000 IJ SOLN
INTRAMUSCULAR | Status: DC | PRN
  Administered 2021-05-05: 8 mL

## 2021-05-05 MED ORDER — INSULIN ASPART 100 UNIT/ML IJ SOLN: 0.0000 [IU] | Freq: Every day | INTRAMUSCULAR | Status: DC

## 2021-05-05 MED ORDER — BUPIVACAINE-EPINEPHRINE (PF) 0.25% -1:200000 IJ SOLN
INTRAMUSCULAR | Status: AC
  Filled 2021-05-05: qty 30

## 2021-05-05 MED ORDER — PROPOFOL 10 MG/ML IV BOLUS
INTRAVENOUS | Status: DC | PRN
  Administered 2021-05-05: 200 mg via INTRAVENOUS
  Administered 2021-05-05: 20 mg via INTRAVENOUS

## 2021-05-05 MED ORDER — OXYCODONE HCL 5 MG PO TABS
5.0000 mg | ORAL_TABLET | Freq: Once | ORAL | Status: AC | PRN
  Administered 2021-05-05: 5 mg via ORAL

## 2021-05-05 MED ORDER — ACETAMINOPHEN 650 MG RE SUPP: 650.0000 mg | RECTAL | Status: DC | PRN

## 2021-05-05 MED ORDER — METHOCARBAMOL 500 MG PO TABS
500.0000 mg | ORAL_TABLET | Freq: Four times a day (QID) | ORAL | Status: DC | PRN
  Administered 2021-05-05: 500 mg via ORAL

## 2021-05-05 MED ORDER — CHLORHEXIDINE GLUCONATE 0.12 % MT SOLN
OROMUCOSAL | Status: AC
  Administered 2021-05-05: 15 mL via OROMUCOSAL
  Filled 2021-05-05: qty 15

## 2021-05-05 MED ORDER — 0.9 % SODIUM CHLORIDE (POUR BTL) OPTIME
TOPICAL | Status: DC | PRN
  Administered 2021-05-05: 500 mL
  Administered 2021-05-05: 1000 mL

## 2021-05-05 MED ORDER — MENTHOL 3 MG MT LOZG: 1.0000 | LOZENGE | OROMUCOSAL | Status: DC | PRN

## 2021-05-05 MED ORDER — CEFAZOLIN SODIUM-DEXTROSE 2-4 GM/100ML-% IV SOLN
INTRAVENOUS | Status: AC
  Filled 2021-05-05: qty 100

## 2021-05-05 MED ORDER — HEMOSTATIC AGENTS (NO CHARGE) OPTIME
TOPICAL | Status: DC | PRN
  Administered 2021-05-05: 1 via TOPICAL

## 2021-05-05 SURGICAL SUPPLY — 64 items
BAG COUNTER SPONGE SURGICOUNT (BAG) IMPLANT
BLADE CLIPPER SURG (BLADE) IMPLANT
BONE VIVIGEN FORMABLE 5.4CC (Bone Implant) ×2 IMPLANT
BUR EGG ELITE 4.0 (BURR) IMPLANT
BUR MATCHSTICK NEURO 3.0 LAGG (BURR) IMPLANT
CABLE BIPOLOR RESECTION CORD (MISCELLANEOUS) ×4 IMPLANT
CANISTER SUCT 3000ML PPV (MISCELLANEOUS) ×2 IMPLANT
CLSR STERI-STRIP ANTIMIC 1/2X4 (GAUZE/BANDAGES/DRESSINGS) ×2 IMPLANT
COVER MAYO STAND STRL (DRAPES) ×6 IMPLANT
COVER SURGICAL LIGHT HANDLE (MISCELLANEOUS) ×4 IMPLANT
DEVICE ENDSKLTN IMPLANT SM 7MM (Cage) ×3 IMPLANT
DRAPE C-ARM 42X72 X-RAY (DRAPES) ×2 IMPLANT
DRAPE POUCH INSTRU U-SHP 10X18 (DRAPES) ×2 IMPLANT
DRAPE SURG 17X23 STRL (DRAPES) ×2 IMPLANT
DRAPE U-SHAPE 47X51 STRL (DRAPES) ×2 IMPLANT
DRSG OPSITE POSTOP 3X4 (GAUZE/BANDAGES/DRESSINGS) ×2 IMPLANT
DRSG OPSITE POSTOP 4X6 (GAUZE/BANDAGES/DRESSINGS) ×2 IMPLANT
DURAPREP 26ML APPLICATOR (WOUND CARE) ×2 IMPLANT
ELECT COATED BLADE 2.86 ST (ELECTRODE) ×2 IMPLANT
ELECT PENCIL ROCKER SW 15FT (MISCELLANEOUS) ×2 IMPLANT
ELECT REM PT RETURN 9FT ADLT (ELECTROSURGICAL) ×2
ELECTRODE REM PT RTRN 9FT ADLT (ELECTROSURGICAL) ×1 IMPLANT
ENDOSKELETON IMPLANT SM 7MM (Cage) ×6 IMPLANT
GLOVE SURG ENC MOIS LTX SZ6.5 (GLOVE) ×2 IMPLANT
GLOVE SURG MICRO LTX SZ8.5 (GLOVE) ×2 IMPLANT
GLOVE SURG UNDER POLY LF SZ6.5 (GLOVE) ×2 IMPLANT
GLOVE SURG UNDER POLY LF SZ8.5 (GLOVE) ×2 IMPLANT
GOWN STRL REUS W/ TWL LRG LVL3 (GOWN DISPOSABLE) ×2 IMPLANT
GOWN STRL REUS W/TWL 2XL LVL3 (GOWN DISPOSABLE) ×4 IMPLANT
GOWN STRL REUS W/TWL LRG LVL3 (GOWN DISPOSABLE) ×2
KIT BASIN OR (CUSTOM PROCEDURE TRAY) ×2 IMPLANT
KIT TURNOVER KIT B (KITS) ×2 IMPLANT
NEEDLE HYPO 22GX1.5 SAFETY (NEEDLE) ×2 IMPLANT
NEEDLE SPNL 18GX3.5 QUINCKE PK (NEEDLE) ×2 IMPLANT
NS IRRIG 1000ML POUR BTL (IV SOLUTION) ×2 IMPLANT
PACK ORTHO CERVICAL (CUSTOM PROCEDURE TRAY) ×2 IMPLANT
PACK UNIVERSAL I (CUSTOM PROCEDURE TRAY) ×2 IMPLANT
PAD ARMBOARD 7.5X6 YLW CONV (MISCELLANEOUS) ×6 IMPLANT
PATTIES SURGICAL .25X.25 (GAUZE/BANDAGES/DRESSINGS) ×2 IMPLANT
PATTIES SURGICAL .5 X.5 (GAUZE/BANDAGES/DRESSINGS) IMPLANT
PIN ACP TEMP FIXATION (EXFIX) ×2 IMPLANT
PIN DISTRACTION MAXCESS-C 14 (PIN) ×2 IMPLANT
PLATE ACP 1.9X54 3LVL (Plate) ×2 IMPLANT
POSITIONER HEAD DONUT 9IN (MISCELLANEOUS) ×2 IMPLANT
RESTRAINT LIMB HOLDER UNIV (RESTRAINTS) ×2 IMPLANT
SCREW ACP VA SD 3.5X15 (Screw) ×2 IMPLANT
SPONGE INTESTINAL PEANUT (DISPOSABLE) ×4 IMPLANT
SPONGE SURGIFOAM ABS GEL 100 (HEMOSTASIS) ×2 IMPLANT
SPONGE T-LAP 4X18 ~~LOC~~+RFID (SPONGE) ×4 IMPLANT
SURGIFLO W/THROMBIN 8M KIT (HEMOSTASIS) ×4 IMPLANT
SUT BONE WAX W31G (SUTURE) ×2 IMPLANT
SUT MNCRL AB 3-0 PS2 27 (SUTURE) ×2 IMPLANT
SUT SILK 2 0 (SUTURE) ×2
SUT SILK 2-0 18XBRD TIE 12 (SUTURE) ×1 IMPLANT
SUT VIC AB 2-0 CT1 18 (SUTURE) ×2 IMPLANT
SYR BULB IRRIG 60ML STRL (SYRINGE) ×2 IMPLANT
SYR CONTROL 10ML LL (SYRINGE) ×2 IMPLANT
TAPE CLOTH 4X10 WHT NS (GAUZE/BANDAGES/DRESSINGS) ×2 IMPLANT
TAPE UMBILICAL 1/8X30 (MISCELLANEOUS) ×2 IMPLANT
TAPE UMBILICAL COTTON 1/8X30 (MISCELLANEOUS) ×2 IMPLANT
TOWEL GREEN STERILE (TOWEL DISPOSABLE) ×2 IMPLANT
TOWEL GREEN STERILE FF (TOWEL DISPOSABLE) ×2 IMPLANT
TRAY FOLEY MTR SLVR 16FR STAT (SET/KITS/TRAYS/PACK) ×2 IMPLANT
WATER STERILE IRR 1000ML POUR (IV SOLUTION) ×2 IMPLANT

## 2021-05-05 NOTE — Discharge Instructions (Addendum)
° ° ° ° ° °Today you will be discharged from the hospital.  The purpose of the following handout is to help guide you over the next 2 weeks.  First and foremost, be sure you have a follow up appointment with Dr. Moselle Rister 2 weeks from the time of your surgery to have your sutures removed.  Please call [facility] [phone] to schedule or confirm this appointment.   ° ° ° °Brace °You do not have to wear the collar while lying in bed or sitting in a high-backed chair, eating, sleeping or showering.  Other than these instances, you must wear the brace.  You may NOT wear the collar while driving a vehicle (see driving restrictions below).  It is advisable that you wear the collar in public places or while traveling in a car as a passenger.  Dr. Rahm Minix will discuss further use of the collar at your 2 week postop visit. ° °Wound Care °You may SHOWER 5 days from the date of surgery.  Shower directly over the steri-strips.  DO NOT scrub or submerge (bath tub, swimming pool, hot tub, etc.) the area.  Pat to dry following your shower.  There is no need for additional dressings other than the steri-strips.  Allow the steri-strips to fall off on their own.  Once the strips have fallen off, you may leave the area undressed.  DO NOT apply lotion/cream/ointment to the area.  The wound must remain dry at all times other than while showering.  Dr. Audryanna Zurita or his staff will remove your stiches at your first postop visit and give you additional instructions regarding wound care at that time.  ° °Activity °NO DRIVING FOR 2 WEEKS.  No lifting over 5 pounds (approximately a gallon of milk).  No bending, stooping, squatting or twisting.  No overhead activities.  We encourage you to walk (short distances and often throughout the day) as you can tolerate.  A good rule of thumb is to get up and move once or twice every hour.  You may go up and down stairs carefully.  As you continue to recover, Dr. Izadora Roehr will address and  adjust restrictions to your activities until no further restrictions are needed.  However, until your first postop visit, when Dr. Mylissa Lambe can assess your recovery, you are to follow these instructions.  At the end of this document is a tentative outline of activities for up to 1 year.   ° ° ° ° °Medication °You will be discharged from the hospital with medication for pain, spasm, nausea and constipation.  You will be given enough medication to last until your first postop visit in 2 weeks.  Medications WILL NOT BE REFILLED EARLY; therefore, you are to take the medications only as directed.  If you have been given multiple prescriptions, please leave them with your pharmacy.  They can keep them on file for when you need them.  Medications that are lost or stolen WILL NOT be replaced.  We will address the need for continuing certain medications on an individual basis during your postop visit.  We ask that you avoid over the counter anti-inflammatory medications (Advil, Aleve, Motrin) for 3 months.   ° °What you can expect following neck surgery... °It is not uncommon to experience a sore throat or difficulty swallowing following neck surgery.  Cold liquids and soft foods are helpful in soothing this discomfort.  There is no specific diet that you are to follow after surgery, however, there are a   you should keep in mind to avoid unneeded discomfort.  Take small bites and eat slowly.  Chew your food thoroughly before swallowing.   It is not uncommon to experience incisional soreness or pain in the back of the neck, shoulders or between the shoulder blades.  These symptoms will slowly begin to resolve as you continue to recover, however, they can last for a few weeks.    It is not uncommon to experience INTERMITTENT arm pain following surgery.  This pain can mimic the arm pain you had prior to surgery.  As long as the pain resolves on its own and is not constant, there is no need to become alarmed.   When To  Call If you experience fever >101F, loss of bowel or bladder control, painful swelling in the lower extremities, constant (unresolving) arm pain.  If you experience any of these symptoms, please call [place] [phone].  What's Next As mentioned earlier, you will follow up with Dr. Rolena Infante in 2 weeks.  At that time, we will likely remove your stitches and discuss additional aspects of your recovery.    ACTIVITY GUIDELINES ANTERIOR CERVICAL DISECTOMY AND FUSION  Activity Discharge 2 weeks 6 weeks 3 months 6 months 1 year  Shower 5 days        Submerge the wound  no no yes     Walking outdoors yes       Lifting 5 lbs yes       Climbing stairs yes       Cooking yes       Car rides (less than 30 minutes) yes       Car rides (greater than 30 minutes) no varies yes     Air travel no varies yes     Short outings [employer], visits, etc...) yes       School no no yes     Driving a car no no varies yes    Light upper extremity exercises no no varies yes    Stationary bike no no yes     Swimming (no diving) no no no varies yes   Vacuuming, laundry, mopping no no no varies yes   Biking outdoors no no no no varies yes  Light jogging no no no varies yes   Low impact aerobics no no no varies yes   Non-contact sports (tennis, golf) no no no varies yes   Hunting (no tree climbing) no no no varies yes   Dancing (non-gymnastics) no no no varies yes   Down-hill skiing (experienced skier) no no no no yes   Down-hill skiing (novice) no no no no yes   Cross-country skiing no no no no yes   Horseback riding (noncompetitive)  no no no no yes   Horseback riding (competitive) no no no no varies yes  Gardening/landscaping no no no varies yes   House repairs no no no varies varies yes  Lifting up to 50 lbs no no no no varies yes      RESTART PLAVIX ON Friday MORNING.  CALL MD IF YOU NOTICE BLEEDING OR EXCESSIVE SWELLING.

## 2021-05-05 NOTE — H&P (Signed)
Addendum H&P: There has been no change in his clinical exam since his last office note of 04/26/2021.  Patient continues to have neck and neuropathic left arm pain.  Despite appropriate conservative measures his quality of life continues to deteriorate.  Plan on 3 level ACDF C4 through 7.  Have again gone over the risks, benefits, and alternatives to surgery and all of his questions were addressed.

## 2021-05-05 NOTE — Anesthesia Procedure Notes (Signed)
Procedure Name: Intubation Date/Time: 05/05/2021 8:44 AM Performed by: Jenne Campus, CRNA Pre-anesthesia Checklist: Patient identified, Emergency Drugs available, Suction available and Patient being monitored Patient Re-evaluated:Patient Re-evaluated prior to induction Oxygen Delivery Method: Circle System Utilized Preoxygenation: Pre-oxygenation with 100% oxygen Induction Type: IV induction Ventilation: Mask ventilation without difficulty and Oral airway inserted - appropriate to patient size Laryngoscope Size: Glidescope and 4 Grade View: Grade I Tube type: Oral Tube size: 7.5 mm Number of attempts: 1 Airway Equipment and Method: Stylet, Oral airway and Video-laryngoscopy Placement Confirmation: ETT inserted through vocal cords under direct vision, positive ETCO2 and breath sounds checked- equal and bilateral Secured at: 23 cm Tube secured with: Tape Dental Injury: Teeth and Oropharynx as per pre-operative assessment

## 2021-05-05 NOTE — Anesthesia Postprocedure Evaluation (Signed)
Anesthesia Post Note  Patient: Eddie Garner  Procedure(s) Performed: ANTERIOR CERVICAL DECOMPRESSION/DISCECTOMY FUSION 3 LEVELS C4-7     Patient location during evaluation: PACU Anesthesia Type: General Level of consciousness: sedated Pain management: pain level controlled Vital Signs Assessment: post-procedure vital signs reviewed and stable Respiratory status: spontaneous breathing and respiratory function stable Cardiovascular status: stable Postop Assessment: no apparent nausea or vomiting Anesthetic complications: no   No notable events documented.  Last Vitals:  Vitals:   05/05/21 1325 05/05/21 1337  BP: (!) 173/99 (!) 181/97  Pulse: 95 92  Resp: 18 14  Temp:    SpO2: 95% 95%    Last Pain:  Vitals:   05/05/21 0652  TempSrc:   PainSc: 2                  Merlinda Frederick

## 2021-05-05 NOTE — Transfer of Care (Signed)
Immediate Anesthesia Transfer of Care Note  Patient: Eddie Garner  Procedure(s) Performed: ANTERIOR CERVICAL DECOMPRESSION/DISCECTOMY FUSION 3 LEVELS C4-7  Patient Location: PACU  Anesthesia Type:General  Level of Consciousness: alert , oriented and patient cooperative  Airway & Oxygen Therapy: Patient Spontanous Breathing and Patient connected to nasal cannula oxygen  Post-op Assessment: Report given to RN and Post -op Vital signs reviewed and stable  Post vital signs: Reviewed  Last Vitals:  Vitals Value Taken Time  BP 180/99 05/05/21 1323  Temp    Pulse 96 05/05/21 1323  Resp 19 05/05/21 1323  SpO2 94 % 05/05/21 1323  Vitals shown include unvalidated device data.  Last Pain:  Vitals:   05/05/21 0652  TempSrc:   PainSc: 2       Patients Stated Pain Goal: 2 (60/73/71 0626)  Complications: No notable events documented.

## 2021-05-05 NOTE — Op Note (Signed)
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 NuVasive anterior cervical (ACP) plate: 54 mm length, 15 mm locking screws C4-7  Allograft: vivigen  BRIEF HISTORY: Eddie Garner is a 60 y.o. male who presents to my office with complaints of severe neck and radicular arm pain after a work-related injury extensive conservative management had failed to alleviate his symptoms.  As result of the cervical radicular arm pain and the loss in quality of life we elected to move forward with surgery.  All appropriate risks, benefits, and alternatives to surgery were discussed with the patient and consent was obtained.  PROCEDURE DETAILS: Patient was brought into the operating room. After successful induction of general anesthesia and endotracheal intubation a Time Out was done. This confirmed all pertinent important data.  Teds, SCDs, and a Foley were inserted.  The anterior cervical spine was prepped and draped in a standard fashion.  The lateral fluoroscopy identified the C4-5 disc space and marked this the skin surface.  I then marked out my longitudinal incision on the left side.  This would be a standard Smith-Robinson approach through a longitudinal incision to the cervical spine.  Longitudinal incision was made and sharp dissection was carried out down to and through the platysma.  Continue dissecting sharply along the medial border of the sternocleidomastoid until identified the omohyoid muscle.  The omohyoid muscle was isolated and sacrificed for  improved visualization.  I continued sharply dissecting through the remainder of the deep cervical and prevertebral fascia until I could visualize the anterior longitudinal ligament.  I then swept the esophagus and trachea to the right and began using my Kitner is to continue the dissection to expose the C4-5 through C6-7 disc space.  A needle was then placed into the 4 5 disc space and an x-ray was taken confirming I was at the appropriate level.  I used the Bovie to marked the disc space and then used bipolar cautery to mobilize the longus coli muscles from the superior aspect of C4 to the inferior aspect of C7.  This was done bilaterally.  The overlying osteophytes at each of the disc space level was then removed with a double-action Leksell rongeur.  I then placed my self-retaining retractors underneath the longus coli muscle deflated the endotracheal cuff and expanded the retractor device.  Distraction pins were placed into the body of C6 and C7 in the C6-7 disc space was visualized.  Annulotomy was performed with a 15 blade scalpel and I remove the bulk of the disc material with pituitary rongeurs.  I then remove the overhanging osteophyte from the inferior aspect of C6 to better visualize the disc space.  I then distracted the intervertebral space and maintained this with the distraction pin set.  I continued use pituitary rongeurs and curettes to remove the bulk of the disc material.  I then used a fine nerve hook to dissect through the posterior annulus and used a 1 mm Kerrison rongeur to remove the annulus.  I can now visualize the posterior longitudinal ligament.  Using a fine nerve hook I gently dissected underneath  the PLL and to create a plane between the PLL and the thecal sac.  I used my 1 mm Kerrison rongeur to resect the posterior longitudinal ligament.  Once this was done I was able to resect the osteophyte underneath the uncovertebral joint and remove the posterior osteophytes from the  vertebral bodies of C6 and C7.  At this point I could freely pass my nerve hook underneath the vertebral body and under the uncovertebral joint confirming adequate decompression.  I confirmed satisfactory decompression using fluoroscopy.  At this point I irrigated the wound copiously so normal saline and then rasped the endplates to ensure I had bleeding subchondral bone.  I then used the trialing implants and elected use the size 7 small intervertebral spacer.  The implant was obtained and packed with the allograft and malleted to the appropriate depth.  Once solidly secured I then removed the distraction pin from the body of C7 and replaced this into the body of C5.  I then repositioned my retractors to expose the C5-6 disc space.  At this point I used the same technique I used at C6-7 to perform a discectomy at C5-6.  After performing the annulotomy and resecting the bulk of the disc material I distracted the intervertebral space and then remove the anterior osteophyte from the inferior aspect of C5 using a Kerrison rongeur.  I continued using my neuro curettes to remove the bulk of the disc material until I was at the posterior annulus.  Using a nerve hook and 1 mm Kerrison rongeur I resected the posterior annulus and eventually the posterior longitudinal ligament.  I then remove the osteophytes from under the uncovertebral joint and the posterior aspect of the vertebral bodies and confirmed I had adequate discectomy/decompression.  I then trialed the intervertebral space, rasped the endplates still had bleeding subchondral bone and then placed the appropriate size 7 mm small lordotic spacer.  Again this was packed with the allograft before implantation.  With the C5-6 discectomy complete I then repositioned the distraction pin from C6 to C4 and the retractors from C5-6 to C4/5.  I again used the same technique to perform a discectomy/decompression at this level.  I again made sure to take down the posterior  annulus and posterior longitudinal ligament to adequately decompress the lateral recess and the foramen.  I confirmed this with fluoroscopy.  I measured the intervertebral space with trialing devices and placed a 7 mm small lordotic implant.  This was again packed with the allograft before implantation.  At this point time imaging studies demonstrated satisfactory position of all 3 intervertebral spacers.  I then obtained a 54 mm anterior cervical plate and contoured it to better fit the restored lordosis of the cervical spine.  I then secured it to the vertebral bodies of C4 and C7 with 15 mm locking screws.  All 4 of the screws had excellent purchase.  I again ensured that the esophagus was not inadvertently entrapped beneath the plate.  Imaging studies demonstrated satisfactory position of the plate and locking screws with fluoroscopy.  The remaining 4 screws were then placed into the body of C5 and C6.  All 8 screws were tightened and noted to have excellent purchase.  The locking device was then engaged per manufacture standards.  I copiously irrigated the wound with normal saline and then checked to ensure that the esophagus was not inadvertently entrapped beneath the plate.  Once I confirm satisfactory position of the hardware I placed Floseal into the wound  to aid in hemostasis.  After final irrigation I confirmed hemostasis.  I then returned the trachea and esophagus to midline and closed the platysma with interrupted 2-0 Vicryl sutures.  The skin was then closed with a running 3-0 Monocryl stitch.  Steri-Strips and a dry dressing were applied and the patient was ultimately extubated and transferred the PACU without incident.  The end of the case all needle and sponge counts were correct.  There were no adverse intraoperative events.  Melina Schools, MD 05/05/2021 12:47 PM

## 2021-05-05 NOTE — Brief Op Note (Signed)
05/05/2021  12:59 PM  PATIENT:  Eddie Garner  60 y.o. male  PRE-OPERATIVE DIAGNOSIS:  Cervical spondylotic radiculopathy  POST-OPERATIVE DIAGNOSIS:  * No post-op diagnosis entered *  PROCEDURE:  Procedure(s) with comments: ANTERIOR CERVICAL DECOMPRESSION/DISCECTOMY FUSION 3 LEVELS C4-7 (N/A) - 3.5 hrs  SURGEON:  Surgeon(s) and Role:    Melina Schools, MD - Primary  PHYSICIAN ASSISTANT:   ASSISTANTS: Nelson Chimes, PA  ANESTHESIA:   general  EBL:  125 mL   BLOOD ADMINISTERED:none  DRAINS: none   LOCAL MEDICATIONS USED:  MARCAINE     SPECIMEN:  No Specimen  DISPOSITION OF SPECIMEN:  N/A  COUNTS:  YES  TOURNIQUET:  * No tourniquets in log *  DICTATION: .Dragon Dictation  PLAN OF CARE: Admit for overnight observation  PATIENT DISPOSITION:  PACU - hemodynamically stable.

## 2021-05-05 NOTE — Addendum Note (Signed)
Addendum  created 05/05/21 1537 by Jenne Campus, CRNA   Intraprocedure Meds edited

## 2021-05-06 ENCOUNTER — Encounter (HOSPITAL_COMMUNITY): Payer: Self-pay | Admitting: Orthopedic Surgery

## 2021-05-06 LAB — GLUCOSE, CAPILLARY
Glucose-Capillary: 161 mg/dL — ABNORMAL HIGH (ref 70–99)
Glucose-Capillary: 179 mg/dL — ABNORMAL HIGH (ref 70–99)
Glucose-Capillary: 130 mg/dL — ABNORMAL HIGH (ref 70–99)
Glucose-Capillary: 160 mg/dL — ABNORMAL HIGH (ref 70–99)

## 2021-05-06 LAB — HEMOGLOBIN A1C
Hgb A1c MFr Bld: 7.9 % — ABNORMAL HIGH (ref 4.8–5.6)
Mean Plasma Glucose: 180.03 mg/dL

## 2021-05-06 MED ORDER — DIAZEPAM 5 MG PO TABS
5.0000 mg | ORAL_TABLET | Freq: Three times a day (TID) | ORAL | Status: DC | PRN
  Administered 2021-05-06: 10 mg via ORAL
  Administered 2021-05-06: 5 mg via ORAL
  Filled 2021-05-06: qty 1
  Filled 2021-05-06: qty 2

## 2021-05-06 MED ORDER — HYDROMORPHONE HCL 1 MG/ML IJ SOLN
1.0000 mg | INTRAMUSCULAR | Status: DC | PRN
  Administered 2021-05-06: 1 mg via INTRAVENOUS
  Filled 2021-05-06: qty 1

## 2021-05-06 MED ORDER — HYDRALAZINE HCL 10 MG PO TABS
10.0000 mg | ORAL_TABLET | Freq: Four times a day (QID) | ORAL | Status: DC | PRN
  Administered 2021-05-06: 10 mg via ORAL
  Administered 2021-05-06: 20 mg via ORAL
  Administered 2021-05-06: 10 mg via ORAL
  Filled 2021-05-06 (×2): qty 1
  Filled 2021-05-06: qty 2

## 2021-05-06 MED ORDER — CALCIUM CARBONATE ANTACID 500 MG PO CHEW
1.0000 | CHEWABLE_TABLET | ORAL | Status: DC | PRN
  Administered 2021-05-06: 200 mg via ORAL
  Filled 2021-05-06: qty 1

## 2021-05-06 MED ORDER — DEXAMETHASONE SODIUM PHOSPHATE 10 MG/ML IJ SOLN
6.0000 mg | Freq: Four times a day (QID) | INTRAMUSCULAR | Status: AC
  Administered 2021-05-06 – 2021-05-07 (×4): 6 mg via INTRAVENOUS
  Filled 2021-05-06 (×4): qty 1

## 2021-05-06 MED FILL — Thrombin For Soln Kit 20000 Unit: CUTANEOUS | Qty: 1 | Status: AC

## 2021-05-06 NOTE — Evaluation (Signed)
Physical Therapy Evaluation Patient Details Name: Eddie Garner MRN: [id number] DOB: 1961/05/01 Today's Date: 05/06/2021   History of Present Illness  60 year old gentleman is had progressive neck and neuropathic left arm pain for over a year now. 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 Pt with increased back and abdominal pain likely due to slow GI system, RN notified. Pt is mod I in all mobility. All education has been completed, cervical handout dispensed and the patient has no further questions. Pt is  Pt does not have any follow-up Physical Therapy, but would benefit from 3-in-1 for ease of getting on and off toilet. PT is signing off. Thank you for this referral.     Follow Up Recommendations No PT follow up;Supervision for mobility/OOB    Equipment Recommendations  3in1 (PT)       Precautions / Restrictions Precautions Precautions: Cervical Precaution Booklet Issued: Yes (comment) Precaution Comments: reviewed booklet and verbalizes understanding Required Braces or Orthoses: Cervical Brace Cervical Brace: Hard collar;Other (comment) (off for walking to bathroom, showers and in bed) Restrictions Weight Bearing Restrictions: No      Mobility  Bed Mobility Overal bed mobility: Modified Independent             General bed mobility comments: increased time and effort, good log rolling and power up    Transfers Overall transfer level: Modified independent Equipment used: None             General transfer comment: increased time, good power up and steadying  Ambulation/Gait Ambulation/Gait assistance: Modified independent (Device/Increase time) Gait Distance (Feet): 200 Feet Assistive device: None Gait Pattern/deviations: WFL(Within Functional  Limits);Step-through pattern Gait velocity: WFL Gait velocity interpretation: >2.62 ft/sec, indicative of community ambulatory General Gait Details: slow, steady gait        Balance Overall balance assessment: No apparent balance deficits (not formally assessed)                                           Pertinent Vitals/Pain Pain Assessment: 0-10 Pain Score: 4  Pain Location: back and under ribs Pain Descriptors / Indicators: Sharp;Pressure Pain Intervention(s): Limited activity within patient's tolerance;Monitored during session;Repositioned    Home Living Family/patient expects to be discharged to:: Private residence Living Arrangements: Spouse/significant other Available Help at Discharge: Family;Available 24 hours/day Type of Home: House Home Access: Stairs to enter   [employer] of Steps: 2 Home Layout: One level Home Equipment: Shower seat;Hand held shower head      Prior Function Level of Independence: Independent                  Extremity/Trunk Assessment   Upper Extremity Assessment Upper Extremity Assessment: Overall WFL for tasks assessed    Lower Extremity Assessment Lower Extremity Assessment: Overall WFL for tasks assessed    Cervical / Trunk Assessment Cervical / Trunk Assessment: Other exceptions Cervical / Trunk Exceptions: s/p ACDF  Communication   Communication: No difficulties  Cognition Arousal/Alertness: Awake/alert Behavior During Therapy: Flat affect Overall Cognitive Status: Within Functional Limits for tasks assessed  General Comments General comments (skin integrity, edema, etc.): Pt with distended apperance, c/o pressure and pain in back and under ribs, pt reports he has not passed gas yet        Assessment/Plan    PT Assessment Patent does not need any further PT services         PT Goals (Current goals can be found in the Care  Plan section)  Acute Rehab PT Goals Patient Stated Goal: get back to golf, fishing and riding his motorcycle PT Goal Formulation: With patient Time For Goal Achievement: 05/20/21 Potential to Achieve Goals: Good     AM-PAC PT "6 Clicks" Mobility  Outcome Measure Help needed turning from your back to your side while in a flat bed without using bedrails?: None Help needed moving from lying on your back to sitting on the side of a flat bed without using bedrails?: None Help needed moving to and from a bed to a chair (including a wheelchair)?: None Help needed standing up from a chair using your arms (e.g., wheelchair or bedside chair)?: None Help needed to walk in hospital room?: None Help needed climbing 3-5 steps with a railing? : None 6 Click Score: 24    End of Session Equipment Utilized During Treatment: Cervical collar Activity Tolerance: Patient tolerated treatment well Patient left: Other (comment) (walking around in room, less pressure on abdomen in standing) Nurse Communication: Mobility status;Other (comment) (increased abdominal pain) PT Visit Diagnosis: Difficulty in walking, not elsewhere classified (R26.2)    Time: 3614-4315 PT Time Calculation (min) (ACUTE ONLY): 19 min   Charges:   PT Evaluation $PT Eval Low Complexity: 1 Low          Reagyn Facemire B. Migdalia Dk PT, DPT Acute Rehabilitation Services Pager [phone] Office [phone]   [place] 05/06/2021, 9:39 AM

## 2021-05-06 NOTE — Progress Notes (Signed)
    Subjective: Procedure(s) (LRB): ANTERIOR CERVICAL DECOMPRESSION/DISCECTOMY FUSION 3 LEVELS C4-7 (N/A) 1 Day Post-Op  Patient reports pain as 5 on 0-10 scale.  Reports decreased arm pain reports incisional neck pain   Positive void Negative bowel movement Positive flatus Negative chest pain or shortness of breath  Objective: Vital signs in last 24 hours: Temp:  [97 F (36.1 C)-98.8 F (37.1 C)] 98.2 F (36.8 C) (07/07 1216) Pulse Rate:  [81-97] 91 (07/07 1216) Resp:  [13-20] 18 (07/07 1216) BP: (156-197)/(81-108) 182/90 (07/07 1216) SpO2:  [93 %-100 %] 93 % (07/07 1216)  Intake/Output from previous day: 07/06 0701 - 07/07 0700 In: 2550 [I.V.:2400; IV Piggyback:150] Out: 475 [Urine:350; Blood:125]  Labs: Recent Labs    05/04/21 1000  WBC 9.0  RBC 4.58  HCT 42.5  PLT 226   Recent Labs    05/04/21 1000  NA 139  K 3.9  CL 109  CO2 23  BUN 25*  CREATININE 0.87  GLUCOSE 158*  CALCIUM 8.8*   Recent Labs    05/04/21 1000  INR 1.0    Physical Exam: Neurologically intact ABD soft Intact pulses distally Incision: dressing C/D/I and no drainage Body mass index is 34.45 kg/m.  Assessment/Plan: Patient stable  xrays n/a Mobilization with physical therapy Encourage incentive spirometry Continue care  Patient is neurologically intact.  There is swelling and tenderness at the incision site.  He denies any shortness of breath or difficulty breathing.  I do think it is worthwhile continue to observe him to ensure the swelling does not intensify.  I recommended ice to aid in the anti-inflammatory process.  He is also complaining of severe axillary neck pain consistent with mechanical/muscular pain.  I am going to switch him from methocarbamol to Valium and I will start him on a short course of Decadron.  We used 6 mg IV every 6 for 24 hours.  We will keep a close eye on his sugars as he is a diabetic.  I will reevaluate in the morning.  If he is improving we  will plan on discharge to home.  If the swelling intensifies or he has difficulty breathing we will get a CT scan to determine if he has an expanding hematoma that would require return to the OR for evacuation.  Melina Schools, MD [facility] [phone]

## 2021-05-07 DIAGNOSIS — E782 Mixed hyperlipidemia: Secondary | ICD-10-CM | POA: Diagnosis present

## 2021-05-07 DIAGNOSIS — I2510 Atherosclerotic heart disease of native coronary artery without angina pectoris: Secondary | ICD-10-CM | POA: Diagnosis present

## 2021-05-07 DIAGNOSIS — I10 Essential (primary) hypertension: Secondary | ICD-10-CM | POA: Diagnosis present

## 2021-05-07 DIAGNOSIS — E890 Postprocedural hypothyroidism: Secondary | ICD-10-CM | POA: Diagnosis present

## 2021-05-07 DIAGNOSIS — Z7984 Long term (current) use of oral hypoglycemic drugs: Secondary | ICD-10-CM | POA: Diagnosis not present

## 2021-05-07 DIAGNOSIS — E669 Obesity, unspecified: Secondary | ICD-10-CM | POA: Diagnosis present

## 2021-05-07 DIAGNOSIS — M5412 Radiculopathy, cervical region: Secondary | ICD-10-CM | POA: Diagnosis present

## 2021-05-07 DIAGNOSIS — Z833 Family history of diabetes mellitus: Secondary | ICD-10-CM | POA: Diagnosis not present

## 2021-05-07 DIAGNOSIS — Z7902 Long term (current) use of antithrombotics/antiplatelets: Secondary | ICD-10-CM | POA: Diagnosis not present

## 2021-05-07 DIAGNOSIS — M4320 Fusion of spine, site unspecified: Secondary | ICD-10-CM | POA: Diagnosis present

## 2021-05-07 DIAGNOSIS — Z9861 Coronary angioplasty status: Secondary | ICD-10-CM | POA: Diagnosis not present

## 2021-05-07 DIAGNOSIS — Z8249 Family history of ischemic heart disease and other diseases of the circulatory system: Secondary | ICD-10-CM | POA: Diagnosis not present

## 2021-05-07 DIAGNOSIS — Y990 Civilian activity done for income or pay: Secondary | ICD-10-CM | POA: Diagnosis not present

## 2021-05-07 DIAGNOSIS — Z79899 Other long term (current) drug therapy: Secondary | ICD-10-CM | POA: Diagnosis not present

## 2021-05-07 DIAGNOSIS — Z823 Family history of stroke: Secondary | ICD-10-CM | POA: Diagnosis not present

## 2021-05-07 DIAGNOSIS — X58XXXA Exposure to other specified factors, initial encounter: Secondary | ICD-10-CM | POA: Diagnosis present

## 2021-05-07 DIAGNOSIS — M4802 Spinal stenosis, cervical region: Secondary | ICD-10-CM | POA: Diagnosis present

## 2021-05-07 DIAGNOSIS — E118 Type 2 diabetes mellitus with unspecified complications: Secondary | ICD-10-CM | POA: Diagnosis present

## 2021-05-07 LAB — GLUCOSE, CAPILLARY: Glucose-Capillary: 155 mg/dL — ABNORMAL HIGH (ref 70–99)

## 2021-05-07 NOTE — Discharge Summary (Signed)
Patient ID: Eddie Garner MRN: [id number] DOB/AGE: Dec 20, 1960 60 y.o.  Admit date: 05/05/2021 Discharge date: 05/07/2021  Admission Diagnoses:  Active Problems:   Fusion of spine, cervical region   Fusion of spine   Discharge Diagnoses:  Active Problems:   Fusion of spine, cervical region   Fusion of spine  status post Procedure(s): ANTERIOR CERVICAL DECOMPRESSION/DISCECTOMY FUSION 3 LEVELS C4-7  Past Medical History:  Diagnosis Date   Angina pectoris ([place])    Coronary artery disease    Diabetes (HCC)    HTN (hypertension)    Hypercholesteremia    Hyperlipidemia    Obesity    Pain in joint    Pneumonia    Right thyroid nodule     Surgeries: Procedure(s): ANTERIOR CERVICAL DECOMPRESSION/DISCECTOMY FUSION 3 LEVELS C4-7 on 05/05/2021   Consultants:   Discharged Condition: Improved  Hospital Course: Eddie Garner is an 60 y.o. male who was admitted 05/05/2021 for operative treatment of Cervical spondylotic radiculopathy. Patient failed conservative treatments (please see the history and physical for the specifics) and had severe unremitting pain that affects sleep, daily activities and work/hobbies. After pre-op clearance, the patient was taken to the operating room on 05/05/2021 and underwent  Procedure(s): ANTERIOR CERVICAL DECOMPRESSION/DISCECTOMY FUSION 3 LEVELS C4-7.    Patient was given perioperative antibiotics:  Anti-infectives (From admission, onward)    Start     Dose/Rate Route Frequency Ordered Stop   05/05/21 1630  ceFAZolin (ANCEF) IVPB 1 g/50 mL premix        1 g 100 mL/hr over 30 Minutes Intravenous Every 8 hours 05/05/21 1533 05/06/21 0918   05/05/21 0656  ceFAZolin (ANCEF) IVPB 2g/100 mL premix        2 g 200 mL/hr over 30 Minutes Intravenous 30 min pre-op 05/05/21 0656 05/05/21 1250   05/05/21 0630  ceFAZolin (ANCEF) 2-4 GM/100ML-% IVPB       Note to Pharmacy: Humberto Leep   : cabinet override      05/05/21 0630 05/05/21 0914        Patient  was given sequential compression devices and early ambulation to prevent DVT.   Patient benefited maximally from hospital stay and there were no complications. At the time of discharge, the patient was urinating/moving their bowels without difficulty, tolerating a regular diet, pain is controlled with oral pain medications and they have been cleared by PT/OT.   Recent vital signs: Patient Vitals for the past 24 hrs:  BP Temp Temp src Pulse Resp SpO2  05/07/21 0527 (!) 154/91 98.1 F (36.7 C) Oral 87 20 93 %  05/06/21 2317 (!) 175/94 98.2 F (36.8 C) Oral 99 18 94 %  05/06/21 1924 (!) 184/90 98.4 F (36.9 C) Oral (!) 106 18 96 %     Recent laboratory studies: No results for input(s): WBC, HGB, HCT, PLT, NA, K, CL, CO2, BUN, CREATININE, GLUCOSE, INR, CALCIUM in the last 72 hours.  Invalid input(s): PT, 2   Discharge Medications:   Allergies as of 05/07/2021   No Known Allergies      Medication List     STOP taking these medications    docusate sodium 100 MG capsule Commonly known as: COLACE   Fish Oil 1000 MG Caps       TAKE these medications    clopidogrel 75 MG tablet Commonly known as: PLAVIX TAKE 1 TABLET BY MOUTH DAILY WITH BREAKFAST. What changed:  how much to take how to take this when to take this additional instructions  dapagliflozin propanediol 10 MG Tabs tablet Commonly known as: FARXIGA Take 10 mg by mouth daily with lunch.   glucose blood test strip 1 each by Other route as needed for other. Use as instructed   lisinopril 10 MG tablet Commonly known as: ZESTRIL Take 1 tablet (10 mg total) by mouth daily.   metFORMIN 500 MG tablet Commonly known as: GLUCOPHAGE Take 1,000 mg by mouth 2 (two) times daily with a meal.   methocarbamol 500 MG tablet Commonly known as: Robaxin Take 1 tablet (500 mg total) by mouth every 8 (eight) hours as needed for up to 5 days for muscle spasms.   nitroGLYCERIN 0.4 MG SL tablet Commonly known as:  NITROSTAT Place 0.4 mg under the tongue every 5 (five) minutes as needed for chest pain.   ondansetron 4 MG tablet Commonly known as: Zofran Take 1 tablet (4 mg total) by mouth every 8 (eight) hours as needed for nausea or vomiting.   oxyCODONE-acetaminophen 10-325 MG tablet Commonly known as: Percocet Take 1 tablet by mouth every 6 (six) hours as needed for up to 5 days for pain.   rosuvastatin 20 MG tablet Commonly known as: CRESTOR Take 1 tablet (20 mg total) by mouth daily.   sitaGLIPtin 100 MG tablet Commonly known as: JANUVIA Take 100 mg by mouth daily.   Tyler Aas FlexTouch 100 UNIT/ML FlexTouch Pen Generic drug: insulin degludec Inject 35 Units into the skin daily.        Diagnostic Studies: DG Chest 2 View  Result Date: 05/04/2021 CLINICAL DATA:  Preop for neck surgery. EXAM: CHEST - 2 VIEW COMPARISON:  November 12, 2018. FINDINGS: The heart size and mediastinal contours are within normal limits. Both lungs are clear. The visualized skeletal structures are unremarkable. IMPRESSION: No active cardiopulmonary disease. Electronically Signed   By: Marijo Conception M.D.   On: 05/04/2021 14:42   DG Cervical Spine 2 or 3 views  Result Date: 05/05/2021 CLINICAL DATA:  Elective surgery. Additional history provided: Anterior cervical decompression/discectomy fusion 3 level C4-7. Provided fluoroscopy time 1 minutes, 3 seconds (26.63 mGy). EXAM: CERVICAL SPINE - 2-3 VIEW; DG C-ARM 1-60 MIN COMPARISON:  Cervical spine MRI 11/10/2020. FINDINGS: PA and lateral view intraoperative fluoroscopic images of the cervical spine are submitted, 4 images total. On the provided images, ACDF hardware now spans the C4-C7 levels (ventral plate and screws as well as interbody devices). No expected finding on the provided views. An ET tube terminates just below the level of the clavicular heads. IMPRESSION: Four intraoperative fluoroscopic images of the cervical spine from C4-C7 ACDF, as described.  Electronically Signed   By: Kellie Simmering DO   On: 05/05/2021 13:00   DG C-Arm 1-60 Min  Result Date: 05/05/2021 CLINICAL DATA:  Elective surgery. Additional history provided: Anterior cervical decompression/discectomy fusion 3 level C4-7. Provided fluoroscopy time 1 minutes, 3 seconds (26.63 mGy). EXAM: CERVICAL SPINE - 2-3 VIEW; DG C-ARM 1-60 MIN COMPARISON:  Cervical spine MRI 11/10/2020. FINDINGS: PA and lateral view intraoperative fluoroscopic images of the cervical spine are submitted, 4 images total. On the provided images, ACDF hardware now spans the C4-C7 levels (ventral plate and screws as well as interbody devices). No expected finding on the provided views. An ET tube terminates just below the level of the clavicular heads. IMPRESSION: Four intraoperative fluoroscopic images of the cervical spine from C4-C7 ACDF, as described. Electronically Signed   By: Kellie Simmering DO   On: 05/05/2021 13:00    Discharge Instructions  Incentive spirometry RT   Complete by: As directed         Follow-up Information     Melina Schools, MD Follow up in 2 week(s).   Specialty: Orthopedic Surgery Why: If symptoms worsen, For suture removal, For wound re-check Contact information: [address] [place] [address] [phone]                 Discharge Plan:  discharge to home  Disposition: stable    Signed: Charlyne Petrin  [facility] [phone] 05/07/2021, 5:28 PM

## 2021-05-07 NOTE — Progress Notes (Signed)
Patient is discharged from room 3C09 at this time. Alert and in stable condition. IV site d/c'd and instructions read to patient  and  with understanding verbalized and all questions answered. Left unit via wheelchair with all belongings at side.

## 2021-05-07 NOTE — Progress Notes (Signed)
    Subjective: Procedure(s) (LRB): ANTERIOR CERVICAL DECOMPRESSION/DISCECTOMY FUSION 3 LEVELS C4-7 (N/A) 2 Days Post-Op  Patient reports pain as 2 on 0-10 scale.  Reports decreased arm pain reports incisional neck pain  - improved Positive void Negative bowel movement Positive flatus Negative chest pain or shortness of breath  Objective: Vital signs in last 24 hours: Temp:  [98.1 F (36.7 C)-98.4 F (36.9 C)] 98.1 F (36.7 C) (07/08 0527) Pulse Rate:  [87-106] 87 (07/08 0527) Resp:  [18-20] 20 (07/08 0527) BP: (154-184)/(90-94) 154/91 (07/08 0527) SpO2:  [93 %-96 %] 93 % (07/08 0527)  Intake/Output from previous day: 07/07 0701 - 07/08 0700 In: -  Out: 600 [Urine:600]  Labs: Recent Labs    05/04/21 1000  WBC 9.0  RBC 4.58  HCT 42.5  PLT 226   Recent Labs    05/04/21 1000  NA 139  K 3.9  CL 109  CO2 23  BUN 25*  CREATININE 0.87  GLUCOSE 158*  CALCIUM 8.8*   Recent Labs    05/04/21 1000  INR 1.0    Physical Exam: Neurologically intact ABD soft Intact pulses distally Incision: dressing C/D/I Compartment soft Body mass index is 34.45 kg/m. Movement in incisional swelling is noted.  Decreased pain with direct palpation over the incision site.  No shortness of breath or dyspnea on exertion.  Assessment/Plan: Patient stable  xrays n/a Mobilization with physical therapy Encourage incentive spirometry Continue care  Patient is doing exceptionally well.  Axillary neck pain is improved as has the swelling at the incision site.  He is ambulating and is neurologically intact.  I will have him restart his Plavix tomorrow, and we will allow him to be discharged today.  He will follow-up with me in 2 weeks as scheduled.  If he notices any swelling or increased discomfort or difficulty swallowing he knows to call me and I will see him sooner  Melina Schools, MD [facility] [phone]

## 2021-11-30 ENCOUNTER — Telehealth: Payer: Self-pay | Admitting: *Deleted

## 2021-11-30 NOTE — Telephone Encounter (Signed)
[place], MD  Chart reviewed as part of pre-operative protocol coverage. Because of Eddie Garner's past medical history and time since last visit, he/she will require a follow-up visit in order to better assess preoperative cardiovascular risk.  Pre-op covering staff: - Please schedule appointment and call patient to inform them. - Please contact requesting surgeon's office via preferred method (i.e, phone, fax) to inform them of need for appointment prior to surgery.  If applicable, this message will also be routed to pharmacy pool and/or primary cardiologist for input on holding anticoagulant/antiplatelet agent as requested below so that this information is available at time of patient's appointment.   Deberah Pelton, NP  11/30/2021, 10:55 AM

## 2021-11-30 NOTE — Telephone Encounter (Signed)
Pt has been scheduled to see Ermalinda Barrios, PA-C, 12/15/2021, clearance will be addressed at that time.  Will route back to the requesting surgeon's office to make them aware.

## 2021-11-30 NOTE — Telephone Encounter (Signed)
° °  Pre-operative Risk Assessment    Patient Name: Eddie Garner  DOB: [birth year] MRN: [id number]      Request for Surgical Clearance    Procedure:   LEFT SHOULDER ARTHROSCOPY   Date of Surgery:  Clearance TBD                                 Surgeon:  DR. Susa Day Surgeon's Group or Practice Name:  [facility] Phone number:  [phone] Fax number:  [phone] ATTN:  SHERRY WILLIS   Type of Clearance Requested:   - Medical  - Pharmacy:  Hold Clopidogrel (Plavix) NOT INDICATED   Type of Anesthesia:  General    Additional requests/questions:    [name]   11/30/2021, 10:37 AM

## 2021-12-07 NOTE — Progress Notes (Signed)
Cardiology Office Note    Date:  12/15/2021   ID:  Eddie Garner, DOB Jun 06, 1961, MRN [id number]   PCP:  Medicine, [facility]  Cardiologist:  Larae Grooms, MD   Advanced Practice Provider:  No care team member to display Electrophysiologist:  None   [id number]   No chief complaint on file.   History of Present Illness:  Eddie Garner is a 61 y.o. male  with history of CAD S/P DES LAD 2009, HTN, HLD, DM2. 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  Patient last saw Dr. Irish Lack 01/14/21 and doing well without angina. Simvastatin changed to rosuvastatin.  Patient on my schedule for Preop clearance for left shoulder arthroscopy by Dr. Susa Day. Tolerated cervical neck fusion 04/2021 without cardiac issues.  Patient denies chest pain, dyspnea, palpitations, dizziness or presyncope. Exercises daily on treadmill 30 min and light weights.     Past Medical History:  Diagnosis Date   Angina pectoris ([place])    Coronary artery disease    Diabetes (HCC)    HTN (hypertension)    Hypercholesteremia    Hyperlipidemia    Obesity    Pain in joint    Pneumonia    Right thyroid nodule     Past Surgical History:  Procedure Laterality Date   ANTERIOR CERVICAL DECOMP/DISCECTOMY FUSION N/A 05/05/2021   Procedure: ANTERIOR CERVICAL DECOMPRESSION/DISCECTOMY FUSION 3 LEVELS C4-7;  Surgeon: Melina Schools, MD;  Location: [facility];  Service: Orthopedics;  Laterality: N/A;  3.5 hrs   APPENDECTOMY     CARDIAC CATHETERIZATION     CORONARY ANGIOPLASTY     DES LAD 10/28/08 (Dr. Irish Lack, [facility])   [place] Left 07/09/2018   Dr. Caralyn Guile   FOOT SURGERY     HAND SURGERY Left 07/09/2018   Dr. Caralyn Guile    SHOULDER ARTHROSCOPY WITH ROTATOR CUFF REPAIR AND SUBACROMIAL DECOMPRESSION Left 04/16/2020   Procedure: SHOULDER ARTHROSCOPY WITH MINI OPEN ROTATOR CUFF REPAIR AND SUBACROMIAL DECOMPRESSION;  Surgeon: Susa Day, MD;  Location: [facility];  Service: Orthopedics;  Laterality: Left;  90 MINS   SHOULDER SURGERY Right 03/05/2018   Dr. Veverly Fells.    THYROID LOBECTOMY Right 11/19/2018   THYROID LOBECTOMY Right 11/19/2018   Procedure: RIGHT THYROID LOBECTOMY;  Surgeon: Ralene Ok, MD;  Location: [facility];  Service: General;  Laterality: Right;    Current Medications: Current Meds  Medication Sig   clopidogrel (PLAVIX) 75 MG tablet TAKE 1 TABLET BY MOUTH DAILY WITH BREAKFAST.   dapagliflozin propanediol (FARXIGA) 10 MG TABS tablet Take 10 mg by mouth daily with lunch.    glucose blood test strip 1 each by Other route as needed for other. Use as instructed   lisinopril (ZESTRIL) 10 MG tablet Take 1 tablet (10 mg total) by mouth daily.   metFORMIN (GLUCOPHAGE) 500 MG tablet Take 1,000 mg by mouth 2 (two) times daily with a meal.   nitroGLYCERIN (NITROSTAT) 0.4 MG SL tablet Place 0.4 mg under the tongue every 5 (five) minutes as needed for chest pain.    ondansetron (ZOFRAN) 4 MG tablet Take 1 tablet (4 mg total) by mouth every 8 (eight) hours as needed for nausea or vomiting.   rosuvastatin (CRESTOR) 20 MG tablet Take 1 tablet (20 mg total) by mouth daily.   TRESIBA FLEXTOUCH 100 UNIT/ML FlexTouch Pen Inject 35 Units into the skin daily.     Allergies:   Patient has no known allergies.  Social History   Socioeconomic History   Marital status: Married    Spouse name: Not on file   Number of children: Not on file   Years of education: Not on file   Highest education level: Not on file  Occupational History   Not on file  Tobacco Use   Smoking status: Never   Smokeless tobacco: Never  Vaping Use   Vaping Use: Never used  Substance and Sexual Activity   Alcohol use: Yes    Comment: rare   Drug use: Never   Sexual activity: Not on file  Other Topics Concern   Not on file  Social History Narrative   Not on file   Social Determinants of Health   Financial Resource Strain: Not on file   Food Insecurity: Not on file  Transportation Needs: Not on file  Physical Activity: Not on file  Stress: Not on file  Social Connections: Not on file     Family History:  The patient's  family history includes Atrial fibrillation in his father; CVA in his paternal grandfather; Coronary artery disease in his maternal grandfather; Diabetes in his paternal grandmother; Stroke in his paternal grandfather.   ROS:   Please see the history of present illness.    ROS All other systems reviewed and are negative.   PHYSICAL EXAM:   VS:  BP 140/80 (BP Location: Left Arm, Patient Position: Sitting, Cuff Size: Normal)    Pulse 68    Ht 5\' 10"  (1.778 m)    Wt 244 lb (110.7 kg)    BMI 35.01 kg/m   Physical Exam  GEN: Well nourished, well developed, in no acute distress  Neck: no JVD, carotid bruits, or masses Cardiac:RRR; no murmurs, rubs, or gallops  Respiratory:  clear to auscultation bilaterally, normal work of breathing GI: soft, nontender, nondistended, + BS Ext: without cyanosis, clubbing, or edema, Good distal pulses bilaterally Neuro:  Alert and Oriented x 3 Psych: euthymic mood, full affect  Wt Readings from Last 3 Encounters:  12/15/21 244 lb (110.7 kg)  05/05/21 240 lb 1.3 oz (108.9 kg)  05/04/21 240 lb (108.9 kg)      Studies/Labs Reviewed:   EKG:  EKG is  ordered today.  The ekg ordered today demonstrates NSR no change  Recent Labs: 04/16/2021: ALT 16 05/04/2021: BUN 25; Creatinine, Ser 0.87; Hemoglobin 14.2; Platelets 226; Potassium 3.9; Sodium 139   Lipid Panel    Component Value Date/Time   CHOL 86 (L) 04/16/2021 0724   TRIG 114 04/16/2021 0724   HDL 24 (L) 04/16/2021 0724   CHOLHDL 3.6 04/16/2021 0724   LDLCALC 41 04/16/2021 0724    Additional studies/ records that were reviewed today include:  Exercise myoview 12/23/20  The left ventricular ejection fraction is normal (55-65%). Nuclear stress EF: 56%. Blood pressure demonstrated a hypertensive response to  exercise. Horizontal ST segment depression ST segment depression was noted during stress in the II, III, aVF, V5 and V6 leads, beginning at 9 minutes of stress, and returning to baseline after 1-5 minutes of recovery. 1.5 mm The study is normal. This is a low risk study.   Exercise time: 9:30 min Workload: 10.9 METS, excellent exercise capacity Test stopped due to: fatigue BP response: hypertensive response to exercise HR response: normal Rhythm: SR Borderline ischemic changes on ECG at peak stress, resolving within 5 minutes of recovery.    No ischemia or infarction on perfusion images. Normal wall motion. Study is low risk at excellent exercise capacity.  Risk Assessment/Calculations:         ASSESSMENT:    1. Preoperative clearance   2. Coronary artery disease involving native coronary artery of native heart with other form of angina pectoris ([place])   3. Essential hypertension   4. Mixed hyperlipidemia      PLAN:  In order of problems listed above:  Preop clearance for left shoulder arthroscopy by Dr. Susa Day. Patient with history of CAD without angina, normal exercise myoview  12/2020 and tolerated surgery 10/5174 without complications. Can proceed with surgery without any cardiac testing. Discussed with Dr. Irish Lack and he can hold Plavix 5 days prior to surgery.  According to the Revised Cardiac Risk Index (RCRI), his Perioperative Risk of Major Cardiac Event is (%): 6.6  His Functional Capacity in METs is: 7.59 according to the [employer] Activity Status Index (DASI).  CAD DES LAD 2009, normal exercise myoview 12/2020-no angina  HTN-BP diastolic trending higher but patient working on weight loss. He will continue to monitor and call us if it stays elevated  HLD LDL 41 03/2021 on crestor  DM2 managed by PCP  Shared Decision Making/Informed Consent        Medication Adjustments/Labs and Tests Ordered: Current medicines are reviewed at length with the patient  today.  Concerns regarding medicines are outlined above.  Medication changes, Labs and Tests ordered today are listed in the Patient Instructions below. Patient Instructions  Medication Instructions:  Your physician recommends that you continue on your current medications as directed. Please refer to the Current Medication list given to you today.  *If you need a refill on your cardiac medications before your next appointment, please call your pharmacy*   Lab Work: None ordered  If you have labs (blood work) drawn today and your tests are completely normal, you will receive your results only by: [place] (if you have MyChart) OR A paper copy in the mail If you have any lab test that is abnormal or we need to change your treatment, we will call you to review the results.   Testing/Procedures: None ordered   Follow-Up: At [facility], you and your health needs are our priority.  As part of our continuing mission to provide you with exceptional heart care, we have created designated Provider Care Teams.  These Care Teams include your primary Cardiologist (physician) and Advanced Practice Providers (APPs -  Physician Assistants and Nurse Practitioners) who all work together to provide you with the care you need, when you need it.  We recommend signing up for the patient portal called "MyChart".  Sign up information is provided on this After Visit Summary.  MyChart is used to connect with patients for Virtual Visits (Telemedicine).  Patients are able to view lab/test results, encounter notes, upcoming appointments, etc.  Non-urgent messages can be sent to your provider as well.   To learn more about what you can do with MyChart, go to [email or web address].    Your next appointment:   12 month(s)  The format for your next appointment:   In Person  Provider:   Larae Grooms, MD     Other Instructions     Signed, Ermalinda Barrios, PA-C  12/15/2021 9:19 AM    [facility] [address], [place], [place]  [id number] Phone: [phone]; Fax: [phone]

## 2021-12-15 ENCOUNTER — Encounter: Payer: Self-pay | Admitting: Physician Assistant

## 2021-12-15 ENCOUNTER — Ambulatory Visit (INDEPENDENT_AMBULATORY_CARE_PROVIDER_SITE_OTHER): Payer: 59 | Admitting: Physician Assistant

## 2021-12-15 ENCOUNTER — Other Ambulatory Visit: Payer: Self-pay

## 2021-12-15 VITALS — BP 140/80 | HR 68 | Ht 70.0 in | Wt 244.0 lb

## 2021-12-15 DIAGNOSIS — E782 Mixed hyperlipidemia: Secondary | ICD-10-CM

## 2021-12-15 DIAGNOSIS — I25118 Atherosclerotic heart disease of native coronary artery with other forms of angina pectoris: Secondary | ICD-10-CM | POA: Diagnosis not present

## 2021-12-15 DIAGNOSIS — I10 Essential (primary) hypertension: Secondary | ICD-10-CM

## 2021-12-15 DIAGNOSIS — Z01818 Encounter for other preprocedural examination: Secondary | ICD-10-CM | POA: Diagnosis not present

## 2021-12-15 NOTE — Patient Instructions (Signed)
Medication Instructions:  Your physician recommends that you continue on your current medications as directed. Please refer to the Current Medication list given to you today.  *If you need a refill on your cardiac medications before your next appointment, please call your pharmacy*   Lab Work: None ordered  If you have labs (blood work) drawn today and your tests are completely normal, you will receive your results only by: [place] (if you have MyChart) OR A paper copy in the mail If you have any lab test that is abnormal or we need to change your treatment, we will call you to review the results.   Testing/Procedures: None ordered   Follow-Up: At [facility], you and your health needs are our priority.  As part of our continuing mission to provide you with exceptional heart care, we have created designated Provider Care Teams.  These Care Teams include your primary Cardiologist (physician) and Advanced Practice Providers (APPs -  Physician Assistants and Nurse Practitioners) who all work together to provide you with the care you need, when you need it.  We recommend signing up for the patient portal called "MyChart".  Sign up information is provided on this After Visit Summary.  MyChart is used to connect with patients for Virtual Visits (Telemedicine).  Patients are able to view lab/test results, encounter notes, upcoming appointments, etc.  Non-urgent messages can be sent to your provider as well.   To learn more about what you can do with MyChart, go to [email or web address].    Your next appointment:   12 month(s)  The format for your next appointment:   In Person  Provider:   Larae Grooms, MD     Other Instructions

## 2021-12-29 ENCOUNTER — Ambulatory Visit: Payer: Self-pay | Admitting: Orthopedic Surgery

## 2021-12-29 DIAGNOSIS — M7502 Adhesive capsulitis of left shoulder: Secondary | ICD-10-CM

## 2022-01-06 ENCOUNTER — Ambulatory Visit: Payer: Self-pay | Admitting: Orthopedic Surgery

## 2022-01-06 NOTE — H&P (View-Only) (Signed)
Eddie Garner is an 61 y.o. male.   ?Chief Complaint: Left shoulder pain ?HPI: Visit For: Follow up ?Location: left; shoulder ?Duration: 1 year and 6 months out from SA/SAD/RCR ?Severity: pain level 3/10 ?Context: work injury ?Alleviating Factors: work conditioning ?Previous PT: last Monday (11/22/21) was his last work conditioning visit ?Work Related: yes; Restriction of no use of left arm ?Notes: ?NCM Beth Hagler ? ?Patient was lifting 50 pounds in therapy and he had pain in his neck trapezius back of the shoulder and arm. ? ?Past Medical History:  ?Diagnosis Date  ? Angina pectoris ([place])   ? Coronary artery disease   ? Diabetes ([place])   ? HTN (hypertension)   ? Hypercholesteremia   ? Hyperlipidemia   ? Obesity   ? Pain in joint   ? Pneumonia   ? Right thyroid nodule   ? ? ?Past Surgical History:  ?Procedure Laterality Date  ? ANTERIOR CERVICAL DECOMP/DISCECTOMY FUSION N/A 05/05/2021  ? Procedure: ANTERIOR CERVICAL DECOMPRESSION/DISCECTOMY FUSION 3 LEVELS C4-7;  Surgeon: Melina Schools, MD;  Location: [place];  Service: Orthopedics;  Laterality: N/A;  3.5 hrs  ? APPENDECTOMY    ? CARDIAC CATHETERIZATION    ? CORONARY ANGIOPLASTY    ? DES LAD 10/28/08 (Dr. Irish Lack, [facility])  ? ELBOW SURGERY Left 07/09/2018  ? Dr. Caralyn Guile  ? FOOT SURGERY    ? HAND SURGERY Left 07/09/2018  ? Dr. Caralyn Guile   ? SHOULDER ARTHROSCOPY WITH ROTATOR CUFF REPAIR AND SUBACROMIAL DECOMPRESSION Left 04/16/2020  ? Procedure: SHOULDER ARTHROSCOPY WITH MINI OPEN ROTATOR CUFF REPAIR AND SUBACROMIAL DECOMPRESSION;  Surgeon: Susa Day, MD;  Location: [facility];  Service: Orthopedics;  Laterality: Left;  90 MINS  ? SHOULDER SURGERY Right 03/05/2018  ? Dr. Veverly Fells.   ? THYROID LOBECTOMY Right 11/19/2018  ? THYROID LOBECTOMY Right 11/19/2018  ? Procedure: RIGHT THYROID LOBECTOMY;  Surgeon: Ralene Ok, MD;  Location: [place];  Service: General;  Laterality: Right;  ? ? ?Family History  ?Problem Relation Age of Onset  ? Atrial fibrillation Father   ? Coronary  artery disease Maternal Grandfather   ? Diabetes Paternal Grandmother   ? CVA Paternal Grandfather   ? Stroke Paternal Grandfather   ? ?Social History:  reports that he has never smoked. He has never used smokeless tobacco. He reports current alcohol use. He reports that he does not use drugs. ? ?Allergies: No Known Allergies ? ?Current meds: ?clopidogreL 75 mg tablet ?Farxiga 10 mg tablet ?Fish OiL 1,000 mg (120 mg-180 mg) capsule ?Januvia 100 mg tablet ?lisinopriL 10 mg tablet ?metFORMIN ER 500 mg tablet,extended release 24 hr ?rosuvastatin 20 mg tablet ?sildenafil (pulmonary hypertension) 20 mg tablet ?simvastatin 20 mg tablet ? ?Review of Systems  ?Constitutional: Negative.   ?HENT: Negative.    ?Eyes: Negative.   ?Respiratory: Negative.    ?Cardiovascular: Negative.   ?Gastrointestinal: Negative.   ?Endocrine: Negative.   ?Genitourinary: Negative.   ?Musculoskeletal:  Positive for arthralgias and myalgias.  ?Skin: Negative.   ?Allergic/Immunologic: Negative.   ?Neurological: Negative.   ?Psychiatric/Behavioral: Negative.    ? ?There were no vitals taken for this visit. ?Physical Exam ?Constitutional:   ?   Appearance: Normal appearance.  ?HENT:  ?   Head: Normocephalic and atraumatic.  ?   Right Ear: External ear normal.  ?   Left Ear: External ear normal.  ?   Nose: Nose normal.  ?   Mouth/Throat:  ?   Pharynx: Oropharynx is clear.  ?Eyes:  ?  Conjunctiva/sclera: Conjunctivae normal.  ?Cardiovascular:  ?   Rate and Rhythm: Normal rate and regular rhythm.  ?   Pulses: Normal pulses.  ?Pulmonary:  ?   Effort: Pulmonary effort is normal.  ?Abdominal:  ?   General: Bowel sounds are normal.  ?Musculoskeletal:  ?   Cervical back: Normal range of motion.  ?   Comments: Internal rotation to beltline. External rotation 40 degrees. Abduction 60 degrees. Forward flexion 160. Tender in the trapezius and rhomboid major. He has pain with end range  ?Skin: ?   General: Skin is warm and dry.  ?Neurological:  ?   Mental  Status: He is alert.  ?  ? ?Assessment/Plan ?Impression: ? ?1. 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 Intermittent radicular pain status post cervical fusion ? ?Plan: ? ?I discussion with Mr. Dawson concerning his current situation. 1 option would be to give him permanent restrictions at this point in time which would most likely keep him from being able to return to [place]. The other option would be to consider manipulation of his left shoulder to improve his range of motion . This would include arthroscopic debridement ?Limited to some extent as we would want to avoid significant traction to the left upper extremity so as to not aggravate any nerve pain ?This would have to occur after he is cleared from his cervical spine surgery. He is 7 months out. ?He is to see Dr. Rolena Infante next week for that. ? ?Discussed risk and benefits of that. Including bleeding infection damage to neurovascular doctors. Fracture suboptimal range of motion no improvement in his motion. DVT PE anesthetic complication etc. ? ?Proceed as an outpatient basis follow-up in 2 weeks for suture removal sling. Return to therapy within 3 days following the procedure. And then progress accordingly. ? ?Since been over a year and a half since his MRI of the shoulder it would be reasonable to update his MRI prior to the procedure ? ?I discussed these findings in front of the patient with their case manager. ?I summarized the encounter including the patient's complaints, progress to date, and current recommendations for future treatment and follow-up. ?In addition, we discussed work restrictions and the patient's full duty job description. ? ?Plan Left shoulder exam & manip under anesthesia, arthroscopy, debridement ? ?Cecilie Kicks, PA-C for Dr Tonita Cong ?01/06/2022, 9:46 AM ? ? ? ?

## 2022-01-06 NOTE — H&P (Signed)
Eddie Garner is an 61 y.o. male.   ?Chief Complaint: Left shoulder pain ?HPI: Visit For: Follow up ?Location: left; shoulder ?Duration: 1 year and 6 months out from SA/SAD/RCR ?Severity: pain level 3/10 ?Context: work injury ?Alleviating Factors: work conditioning ?Previous PT: last Monday (11/22/21) was his last work conditioning visit ?Work Related: yes; Restriction of no use of left arm ?Notes: ?NCM Eddie Garner ? ?Patient was lifting 50 pounds in therapy and he had pain in his neck trapezius back of the shoulder and arm. ? ?Past Medical History:  ?Diagnosis Date  ? Angina pectoris ([place])   ? Coronary artery disease   ? Diabetes ([place])   ? HTN (hypertension)   ? Hypercholesteremia   ? Hyperlipidemia   ? Obesity   ? Pain in joint   ? Pneumonia   ? Right thyroid nodule   ? ? ?Past Surgical History:  ?Procedure Laterality Date  ? ANTERIOR CERVICAL DECOMP/DISCECTOMY FUSION N/A 05/05/2021  ? Procedure: ANTERIOR CERVICAL DECOMPRESSION/DISCECTOMY FUSION 3 LEVELS C4-7;  Surgeon: Eddie Garner;  Location: [place];  Service: Orthopedics;  Laterality: N/A;  3.5 hrs  ? APPENDECTOMY    ? CARDIAC CATHETERIZATION    ? CORONARY ANGIOPLASTY    ? DES LAD 10/28/08 (Eddie Garner, [facility])  ? ELBOW SURGERY Left 07/09/2018  ? Eddie. Caralyn Garner  ? FOOT SURGERY    ? HAND SURGERY Left 07/09/2018  ? Eddie. Caralyn Garner   ? SHOULDER ARTHROSCOPY WITH ROTATOR CUFF REPAIR AND SUBACROMIAL DECOMPRESSION Left 04/16/2020  ? Procedure: SHOULDER ARTHROSCOPY WITH MINI OPEN ROTATOR CUFF REPAIR AND SUBACROMIAL DECOMPRESSION;  Surgeon: Eddie Day, Garner;  Location: [facility];  Service: Orthopedics;  Laterality: Left;  90 MINS  ? SHOULDER SURGERY Right 03/05/2018  ? Eddie Garner.   ? THYROID LOBECTOMY Right 11/19/2018  ? THYROID LOBECTOMY Right 11/19/2018  ? Procedure: RIGHT THYROID LOBECTOMY;  Surgeon: Eddie Garner, Garner;  Location: [place];  Service: General;  Laterality: Right;  ? ? ?Family History  ?Problem Relation Age of Onset  ? Atrial fibrillation Father   ? Coronary  artery disease Maternal Grandfather   ? Diabetes Paternal Grandmother   ? CVA Paternal Grandfather   ? Stroke Paternal Grandfather   ? ?Social History:  reports that he has never smoked. He has never used smokeless tobacco. He reports current alcohol use. He reports that he does not use drugs. ? ?Allergies: No Known Allergies ? ?Current meds: ?clopidogreL 75 mg tablet ?Farxiga 10 mg tablet ?Fish OiL 1,000 mg (120 mg-180 mg) capsule ?Januvia 100 mg tablet ?lisinopriL 10 mg tablet ?metFORMIN ER 500 mg tablet,extended release 24 hr ?rosuvastatin 20 mg tablet ?sildenafil (pulmonary hypertension) 20 mg tablet ?simvastatin 20 mg tablet ? ?Review of Systems  ?Constitutional: Negative.   ?HENT: Negative.    ?Eyes: Negative.   ?Respiratory: Negative.    ?Cardiovascular: Negative.   ?Gastrointestinal: Negative.   ?Endocrine: Negative.   ?Genitourinary: Negative.   ?Musculoskeletal:  Positive for arthralgias and myalgias.  ?Skin: Negative.   ?Allergic/Immunologic: Negative.   ?Neurological: Negative.   ?Psychiatric/Behavioral: Negative.    ? ?There were no vitals taken for this visit. ?Physical Exam ?Constitutional:   ?   Appearance: Normal appearance.  ?HENT:  ?   Head: Normocephalic and atraumatic.  ?   Right Ear: External ear normal.  ?   Left Ear: External ear normal.  ?   Nose: Nose normal.  ?   Mouth/Throat:  ?   Pharynx: Oropharynx is clear.  ?Eyes:  ?  Conjunctiva/sclera: Conjunctivae normal.  ?Cardiovascular:  ?   Rate and Rhythm: Normal rate and regular rhythm.  ?   Pulses: Normal pulses.  ?Pulmonary:  ?   Effort: Pulmonary effort is normal.  ?Abdominal:  ?   General: Bowel sounds are normal.  ?Musculoskeletal:  ?   Cervical back: Normal range of motion.  ?   Comments: Internal rotation to beltline. External rotation 40 degrees. Abduction 60 degrees. Forward flexion 160. Tender in the trapezius and rhomboid major. He has pain with end range  ?Skin: ?   General: Skin is warm and dry.  ?Neurological:  ?   Mental  Status: He is alert.  ?  ? ?Assessment/Plan ?Impression: ? ?1. 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 Intermittent radicular pain status post cervical fusion ? ?Plan: ? ?I discussion with Eddie Garner concerning his current situation. 1 option would be to give him permanent restrictions at this point in time which would most likely keep him from being able to return to [place]. The other option would be to consider manipulation of his left shoulder to improve his range of motion . This would include arthroscopic debridement ?Limited to some extent as we would want to avoid significant traction to the left upper extremity so as to not aggravate any nerve pain ?This would have to occur after he is cleared from his cervical spine surgery. He is 7 months out. ?He is to see Eddie. Rolena Garner next week for that. ? ?Discussed risk and benefits of that. Including bleeding infection damage to neurovascular doctors. Fracture suboptimal range of motion no improvement in his motion. DVT PE anesthetic complication etc. ? ?Proceed as an outpatient basis follow-up in 2 weeks for suture removal sling. Return to therapy within 3 days following the procedure. And then progress accordingly. ? ?Since been over a year and a half since his MRI of the shoulder it would be reasonable to update his MRI prior to the procedure ? ?I discussed these findings in front of the patient with their case manager. ?I summarized the encounter including the patient's complaints, progress to date, and current recommendations for future treatment and follow-up. ?In addition, we discussed work restrictions and the patient's full duty job description. ? ?Plan Left shoulder exam & manip under anesthesia, arthroscopy, debridement ? ?Eddie Kicks, PA-C for Eddie Garner ?01/06/2022, 9:46 AM ? ? ? ?

## 2022-01-07 NOTE — Patient Instructions (Addendum)
DUE TO COVID-19 ONLY ONE VISITOR  (aged 62 and older)  IS ALLOWED TO COME WITH YOU AND STAY IN THE WAITING ROOM ONLY DURING PRE OP AND PROCEDURE.   **NO VISITORS ARE ALLOWED IN THE SHORT STAY AREA OR RECOVERY ROOM!!**       Your procedure is scheduled on: 01/19/22   Report to [facility] Main Entrance    Report to admitting at 8:00 AM   Call this number if you have problems the morning of surgery [phone]   Do not eat food :After Midnight.   After Midnight you may have the following liquids until 7:15 AM DAY OF SURGERY  Water Black Coffee (sugar ok, NO MILK/CREAM OR CREAMERS)  Tea (sugar ok, NO MILK/CREAM OR CREAMERS) regular and decaf                             Plain Jell-O (NO RED)                                           Fruit ices (not with fruit pulp, NO RED)                                     Popsicles (NO RED)                                                                  Juice: apple, WHITE grape, WHITE cranberry Sports drinks like Gatorade (NO RED) Clear broth(vegetable,chicken,beef)     The day of surgery:  Drink ONE (1) Pre-Surgery G2 at 7:15 AM the morning of surgery. Drink in one sitting. Do not sip.  This drink was given to you during your hospital  pre-op appointment visit. Nothing else to drink after completing the  Pre-Surgery G2.          If you have questions, please contact your surgeons office.   FOLLOW BOWEL PREP AND ANY ADDITIONAL PRE OP INSTRUCTIONS YOU RECEIVED FROM YOUR SURGEON'S OFFICE!!!     Oral Hygiene is also important to reduce your risk of infection.                                    Remember - BRUSH YOUR TEETH THE MORNING OF SURGERY WITH YOUR REGULAR TOOTHPASTE   Take these medicines the morning of surgery with A SIP OF WATER: Rosuvastatin  These are anesthesia recommendations for holding your anticoagulants.  Please contact your prescribing physician to confirm IF it is safe to hold your anticoagulants for this length  of time.   Eliquis Apixaban   3 DAYS  Xarelto Rivaroxaban   3 DAYS  Plavix Clopidogrel   5 DAYS  Pletal Cilostazol   5 DAYS     DO NOT TAKE ANY ORAL DIABETIC MEDICATIONS DAY OF YOUR SURGERY  How to Manage Your Diabetes Before and After Surgery  Why is it important to control my blood sugar before and after surgery? Improving blood sugar  levels before and after surgery helps healing and can limit problems. A way of improving blood sugar control is eating a healthy diet by:  Eating less sugar and carbohydrates  Increasing activity/exercise  Talking with your doctor about reaching your blood sugar goals High blood sugars (greater than 180 mg/dL) can raise your risk of infections and slow your recovery, so you will need to focus on controlling your diabetes during the weeks before surgery. Make sure that the doctor who takes care of your diabetes knows about your planned surgery including the date and location.  How do I manage my blood sugar before surgery? Check your blood sugar at least 4 times a day, starting 2 days before surgery, to make sure that the level is not too high or low. Check your blood sugar the morning of your surgery when you wake up and every 2 hours until you get to the Short Stay unit. If your blood sugar is less than 70 mg/dL, you will need to treat for low blood sugar: Do not take insulin. Treat a low blood sugar (less than 70 mg/dL) with  cup of clear juice (cranberry or apple), 4 glucose tablets, OR glucose gel. Recheck blood sugar in 15 minutes after treatment (to make sure it is greater than 70 mg/dL). If your blood sugar is not greater than 70 mg/dL on recheck, call [phone] for further instructions. Report your blood sugar to the short stay nurse when you get to Short Stay.  If you are admitted to the hospital after surgery: Your blood sugar will be checked by the staff and you will probably be given insulin after surgery (instead of oral diabetes  medicines) to make sure you have good blood sugar levels. The goal for blood sugar control after surgery is 80-180 mg/dL.   WHAT DO I DO ABOUT MY DIABETES MEDICATION?  Do not take oral diabetes medicines (pills) the morning of surgery.  THE DAY BEFORE SURGERY, take Metformin and Tradjenta as prescribed. Do not take [place].       THE MORNING OF SURGERY, do not take Metformin, Tradjenta, or [place].  Reviewed and Endorsed by [facility] Patient Education Committee, August 2015                               You may not have any metal on your body including jewelry, and body piercing             Do not wear lotions, powders, cologne, or deodorant              Men may shave face and neck.   Do not bring valuables to the hospital. [place].    Patients discharged on the day of surgery will not be allowed to drive home.  Someone NEEDS to stay with you for the first 24 hours after anesthesia.              Please read over the following fact sheets you were given: IF YOU HAVE QUESTIONS ABOUT YOUR PRE-OP INSTRUCTIONS PLEASE CALL [facility] - Preparing for Surgery Before surgery, you can play an important role.  Because skin is not sterile, your skin needs to be as free of germs as possible.  You can reduce the number of germs on your skin by  washing with CHG (chlorahexidine gluconate) soap before surgery.  CHG is an antiseptic cleaner which kills germs and bonds with the skin to continue killing germs even after washing. Please DO NOT use if you have an allergy to CHG or antibacterial soaps.  If your skin becomes reddened/irritated stop using the CHG and inform your nurse when you arrive at Short Stay. Do not shave (including legs and underarms) for at least 48 hours prior to the first CHG shower.  You may shave your face/neck.  Please follow these instructions carefully:  1.  Shower with CHG Soap the night before  surgery and the  morning of surgery.  2.  If you choose to wash your hair, wash your hair first as usual with your normal  shampoo.  3.  After you shampoo, rinse your hair and body thoroughly to remove the shampoo.                             4.  Use CHG as you would any other liquid soap.  You can apply chg directly to the skin and wash.  Gently with a scrungie or clean washcloth.  5.  Apply the CHG Soap to your body ONLY FROM THE NECK DOWN.   Do   not use on face/ open                           Wound or open sores. Avoid contact with eyes, ears mouth and   genitals (private parts).                       Wash face,  Genitals (private parts) with your normal soap.             6.  Wash thoroughly, paying special attention to the area where your    surgery  will be performed.  7.  Thoroughly rinse your body with warm water from the neck down.  8.  DO NOT shower/wash with your normal soap after using and rinsing off the CHG Soap.                9.  Pat yourself dry with a clean towel.            10.  Wear clean pajamas.            11.  Place clean sheets on your bed the night of your first shower and do not  sleep with pets. Day of Surgery : Do not apply any lotions/deodorants the morning of surgery.  Please wear clean clothes to the hospital/surgery center.  FAILURE TO FOLLOW THESE INSTRUCTIONS MAY RESULT IN THE CANCELLATION OF YOUR SURGERY  PATIENT SIGNATURE_________________________________  NURSE SIGNATURE__________________________________  ________________________________________________________________________   Adam Phenix  An incentive spirometer is a tool that can help keep your lungs clear and active. This tool measures how well you are filling your lungs with each breath. Taking long deep breaths may help reverse or decrease the chance of developing breathing (pulmonary) problems (especially infection) following: A long period of time when you are unable to move or be  active. BEFORE THE PROCEDURE  If the spirometer includes an indicator to show your best effort, your nurse or respiratory therapist will set it to a desired goal. If possible, sit up straight or lean slightly forward. Try not to slouch. Hold the incentive spirometer in an upright position.  INSTRUCTIONS FOR USE  Sit on the edge of your bed if possible, or sit up as far as you can in bed or on a chair. Hold the incentive spirometer in an upright position. Breathe out normally. Place the mouthpiece in your mouth and seal your lips tightly around it. Breathe in slowly and as deeply as possible, raising the piston or the ball toward the top of the column. Hold your breath for 3-5 seconds or for as long as possible. Allow the piston or ball to fall to the bottom of the column. Remove the mouthpiece from your mouth and breathe out normally. Rest for a few seconds and repeat Steps 1 through 7 at least 10 times every 1-2 hours when you are awake. Take your time and take a few normal breaths between deep breaths. The spirometer may include an indicator to show your best effort. Use the indicator as a goal to work toward during each repetition. After each set of 10 deep breaths, practice coughing to be sure your lungs are clear. If you have an incision (the cut made at the time of surgery), support your incision when coughing by placing a pillow or rolled up towels firmly against it. Once you are able to get out of bed, walk around indoors and cough well. You may stop using the incentive spirometer when instructed by your caregiver.  RISKS AND COMPLICATIONS Take your time so you do not get dizzy or light-headed. If you are in pain, you may need to take or ask for pain medication before doing incentive spirometry. It is harder to take a deep breath if you are having pain. AFTER USE Rest and breathe slowly and easily. It can be helpful to keep track of a log of your progress. Your caregiver can provide you  with a simple table to help with this. If you are using the spirometer at home, follow these instructions: [place] IF:  You are having difficultly using the spirometer. You have trouble using the spirometer as often as instructed. Your pain medication is not giving enough relief while using the spirometer. You develop fever of 100.5 F (38.1 C) or higher. SEEK IMMEDIATE MEDICAL CARE IF:  You cough up bloody sputum that had not been present before. You develop fever of 102 F (38.9 C) or greater. You develop worsening pain at or near the incision site. MAKE SURE YOU:  Understand these instructions. Will watch your condition. Will get help right away if you are not doing well or get worse. Document Released: 02/27/2007 Document Revised: 01/09/2012 Document Reviewed: 04/30/2007 [facility] Patient Information 2014 [place], [place].   ________________________________________________________________________

## 2022-01-07 NOTE — Progress Notes (Addendum)
Interview done over phone. Patient coming in for labs today. ? ?COVID swab appointment: n/a ? ?COVID Vaccine Completed: yes x2 ?Date COVID Vaccine completed: 01/22/20, 02/19/20 ?Has received booster: ?COVID vaccine manufacturer: Moderna    ? ?Date of COVID positive in last 90 days: no ? ?PCP - [place] ?Cardiologist - Larae Grooms, MD ? ?Cardiac clearance 12/15/21 by Ermalinda Barrios in [place] ? ?Chest x-ray - 05/04/21 Epic ?EKG - 12/15/21 Epic ?Stress Test - 12/23/20 Epic ?ECHO - n/a ?Cardiac Cath - 2009 ?Pacemaker/ICD device last checked: n/a ?Spinal Cord Stimulator: n/a ? ?Bowel Prep - no ? ?Sleep Study - n/a ?CPAP -  ? ?Fasting Blood Sugar -  ?Checks Blood Sugar once a week ? ?Blood Thinner Instructions: Plavix, hold 5 days ?Aspirin Instructions: ?Last Dose: ? ?Activity level: Can go up a flight of stairs and perform activities of daily living without stopping and without symptoms of chest pain or shortness of breath.   ? ?Anesthesia review: HTN, coronary arteriosclerosis, DM 2, CAD ? ?Patient denies shortness of breath, fever, cough and chest pain at PAT appointment ? ? ?Patient verbalized understanding of instructions that were given to them at the PAT appointment. Patient was also instructed that they will need to review over the PAT instructions again at home before surgery.  ?

## 2022-01-10 ENCOUNTER — Other Ambulatory Visit: Payer: Self-pay

## 2022-01-10 ENCOUNTER — Encounter (HOSPITAL_COMMUNITY): Payer: Self-pay

## 2022-01-10 ENCOUNTER — Encounter (HOSPITAL_COMMUNITY)
Admission: RE | Admit: 2022-01-10 | Discharge: 2022-01-10 | Disposition: A | Discharge status: Home / Self Care | Payer: No Typology Code available for payment source | Source: Ambulatory Visit | Attending: Specialist | Admitting: Specialist

## 2022-01-10 VITALS — BP 164/94 | HR 80 | Temp 98.6°F | Resp 18 | Ht 70.0 in | Wt 243.0 lb

## 2022-01-10 DIAGNOSIS — E118 Type 2 diabetes mellitus with unspecified complications: Secondary | ICD-10-CM | POA: Insufficient documentation

## 2022-01-10 DIAGNOSIS — Z955 Presence of coronary angioplasty implant and graft: Secondary | ICD-10-CM | POA: Insufficient documentation

## 2022-01-10 DIAGNOSIS — Z01812 Encounter for preprocedural laboratory examination: Secondary | ICD-10-CM | POA: Diagnosis present

## 2022-01-10 DIAGNOSIS — Z7902 Long term (current) use of antithrombotics/antiplatelets: Secondary | ICD-10-CM | POA: Diagnosis not present

## 2022-01-10 DIAGNOSIS — I2510 Atherosclerotic heart disease of native coronary artery without angina pectoris: Secondary | ICD-10-CM | POA: Insufficient documentation

## 2022-01-10 DIAGNOSIS — M7502 Adhesive capsulitis of left shoulder: Secondary | ICD-10-CM | POA: Insufficient documentation

## 2022-01-10 DIAGNOSIS — I10 Essential (primary) hypertension: Secondary | ICD-10-CM | POA: Diagnosis not present

## 2022-01-10 LAB — CBC
HCT: 43.7 % (ref 39.0–52.0)
Hemoglobin: 15 g/dL (ref 13.0–17.0)
MCH: 31.1 pg (ref 26.0–34.0)
MCHC: 34.3 g/dL (ref 30.0–36.0)
MCV: 90.7 fL (ref 80.0–100.0)
Platelets: 232 10*3/uL (ref 150–400)
RBC: 4.82 MIL/uL (ref 4.22–5.81)
RDW: 13.2 % (ref 11.5–15.5)
WBC: 10.7 10*3/uL — ABNORMAL HIGH (ref 4.0–10.5)
nRBC: 0 % (ref 0.0–0.2)

## 2022-01-10 LAB — BASIC METABOLIC PANEL
Anion gap: 10 (ref 5–15)
BUN: 25 mg/dL — ABNORMAL HIGH (ref 8–23)
CO2: 24 mmol/L (ref 22–32)
Calcium: 8.7 mg/dL — ABNORMAL LOW (ref 8.9–10.3)
Chloride: 105 mmol/L (ref 98–111)
Creatinine, Ser: 1.04 mg/dL (ref 0.61–1.24)
GFR, Estimated: >60 mL/min (ref >60)
Glucose, Bld: 175 mg/dL — ABNORMAL HIGH (ref 70–99)
Potassium: 4.2 mmol/L (ref 3.5–5.1)
Sodium: 139 mmol/L (ref 135–145)

## 2022-01-10 LAB — GLUCOSE, CAPILLARY: Glucose-Capillary: 183 mg/dL — ABNORMAL HIGH (ref 70–99)

## 2022-01-11 LAB — HEMOGLOBIN A1C
Hgb A1c MFr Bld: 7.7 % — ABNORMAL HIGH (ref 4.8–5.6)
Mean Plasma Glucose: 174 mg/dL

## 2022-01-11 NOTE — Anesthesia Preprocedure Evaluation (Addendum)
Anesthesia Evaluation  ?Patient identified by MRN, date of birth, ID band ?Patient awake ? ? ? ?Reviewed: ?Allergy & Precautions, NPO status , Patient's Chart, lab work & pertinent test results ? ?Airway ?Mallampati: II ? ?TM Distance: >3 FB ?Neck ROM: Full ? ? ? Dental ?no notable dental hx. ? ?  ?Pulmonary ?neg pulmonary ROS,  ?  ?Pulmonary exam normal ?breath sounds clear to auscultation ? ? ? ? ? ? Cardiovascular ?hypertension, Pt. on medications ?+ angina + CAD  ?Normal cardiovascular exam ?Rhythm:Regular Rate:Normal ? ? ?  ?Neuro/Psych ?negative neurological ROS ? negative psych ROS  ? GI/Hepatic ?negative GI ROS, Neg liver ROS,   ?Endo/Other  ?negative endocrine ROSdiabetes, Type 2 ? Renal/GU ?negative Renal ROS  ?negative genitourinary ?  ?Musculoskeletal ?negative musculoskeletal ROS ?(+)  ? Abdominal ?(+) + obese,   ?Peds ?negative pediatric ROS ?(+)  Hematology ?negative hematology ROS ?(+)   ?Anesthesia Other Findings ? ? Reproductive/Obstetrics ?negative OB ROS ? ?  ? ? ? ? ? ? ? ? ? ? ? ? ? ?  ?  ? ? ? ? ? ? ? ?Anesthesia Physical ?Anesthesia Plan ? ?ASA: 3 ? ?Anesthesia Plan: General  ? ?Post-op Pain Management: Regional block* and Minimal or no pain anticipated  ? ?Induction: Intravenous ? ?PONV Risk Score and Plan: 2 and Ondansetron, Midazolam and Treatment may vary due to age or medical condition ? ?Airway Management Planned: Oral ETT ? ?Additional Equipment:  ? ?Intra-op Plan:  ? ?Post-operative Plan: Extubation in OR ? ?Informed Consent: I have reviewed the patients History and Physical, chart, labs and discussed the procedure including the risks, benefits and alternatives for the proposed anesthesia with the patient or authorized representative who has indicated his/her understanding and acceptance.  ? ? ? ?Dental advisory given ? ?Plan Discussed with: CRNA ? ?Anesthesia Plan Comments: (See PAT note 01/10/2022, Konrad Felix Ward, PA-C)  ? ? ? ? ? ?Anesthesia  Quick Evaluation ? ?

## 2022-01-11 NOTE — Progress Notes (Signed)
Anesthesia Chart Review ? ? Case: [id number] Date/Time: 01/19/22 1000  ? Procedure: Left shoulder arthroscopy, manipulation under anesthesia, evaluation under anesthesia, debridement (Left: Shoulder) - Regional block  ? Anesthesia type: General  ? Pre-op diagnosis: Left shoulder adhesive capulitis, Impingement  ? Location: [facility] ROOM 10 / [facility]  ? Surgeons: Susa Day, MD  ? ?  ? ? ?DISCUSSION:61 y.o. never smoker with h/o HTN, DM II, CAD (DES LAD 2009), left shoulder adhesive capsulitis scheduled for above procedure 01/19/22 with Dr. Susa Day.  ? ?Pt seen by cardiology 12/15/2021. Per OV note, "Preop clearance for left shoulder arthroscopy by Dr. Susa Day. Patient with history of CAD without angina, normal exercise myoview  12/2020 and tolerated surgery 07/8920 without complications. Can proceed with surgery without any cardiac testing. Discussed with Dr. Irish Lack and he can hold Plavix 5 days prior to surgery. ?  ?According to the Revised Cardiac Risk Index (RCRI), his Perioperative Risk of Major Cardiac Event is (%): 6.6 ?  ?His Functional Capacity in METs is: 7.59 according to the Duke Activity Status Index (DASI)." ? ?Anticipate pt can proceed with planned procedure barring acute status change.   ?VS: BP (!) 164/94   Pulse 80   Temp 37 ?C (Oral)   Resp 18   Ht '5\' 10"'$  (1.778 m)   Wt 110.2 kg   SpO2 96%   BMI 34.87 kg/m?  ? ?PROVIDERS: ?[facility], [place] ? ?Cardiologist - Larae Grooms, MD ?LABS: Labs reviewed: Acceptable for surgery. ?(all labs ordered are listed, but only abnormal results are displayed) ? ?Labs Reviewed  ?CBC - Abnormal; Notable for the following components:  ?    Result Value  ? WBC 10.7 (*)   ? All other components within normal limits  ?BASIC METABOLIC PANEL - Abnormal; Notable for the following components:  ? Glucose, Bld 175 (*)   ? BUN 25 (*)   ? Calcium 8.7 (*)   ? All other components within normal limits  ?HEMOGLOBIN A1C - Abnormal; Notable  for the following components:  ? Hgb A1c MFr Bld 7.7 (*)   ? All other components within normal limits  ?GLUCOSE, CAPILLARY - Abnormal; Notable for the following components:  ? Glucose-Capillary 183 (*)   ? All other components within normal limits  ? ? ? ?IMAGES: ? ? ?EKG: ?12/15/2021 ?Rate 68 bpm  ?NSR ? ?CV: ?Stress Test 12/23/2020 ?The left ventricular ejection fraction is normal (55-65%). ?Nuclear stress EF: 56%. ?Blood pressure demonstrated a hypertensive response to exercise. ?Horizontal ST segment depression ST segment depression was noted during stress in the II, III, aVF, V5 and V6 leads, beginning at 9 minutes of stress, and returning to baseline after 1-5 minutes of recovery. 1.5 mm ?The study is normal. ?This is a low risk study. ?Past Medical History:  ?Diagnosis Date  ? Angina pectoris ([place])   ? Coronary artery disease   ? Diabetes ([place])   ? HTN (hypertension)   ? Hypercholesteremia   ? Hyperlipidemia   ? Obesity   ? Pain in joint   ? Pneumonia   ? Right thyroid nodule   ? ? ?Past Surgical History:  ?Procedure Laterality Date  ? ANTERIOR CERVICAL DECOMP/DISCECTOMY FUSION N/A 05/05/2021  ? Procedure: ANTERIOR CERVICAL DECOMPRESSION/DISCECTOMY FUSION 3 LEVELS C4-7;  Surgeon: Melina Schools, MD;  Location: [place];  Service: Orthopedics;  Laterality: N/A;  3.5 hrs  ? APPENDECTOMY    ? CARDIAC CATHETERIZATION    ? CORONARY ANGIOPLASTY    ?  DES LAD 10/28/08 (Dr. Irish [facility], [facility])  ? ELBOW SURGERY Left 07/09/2018  ? Dr. Caralyn Guile  ? FOOT SURGERY    ? HAND SURGERY Left 07/09/2018  ? Dr. Caralyn Guile   ? SHOULDER ARTHROSCOPY WITH ROTATOR CUFF REPAIR AND SUBACROMIAL DECOMPRESSION Left 04/16/2020  ? Procedure: SHOULDER ARTHROSCOPY WITH MINI OPEN ROTATOR CUFF REPAIR AND SUBACROMIAL DECOMPRESSION;  Surgeon: Susa Day, MD;  [facility];  Service: Orthopedics;  Laterality: Left;  90 MINS  ? SHOULDER SURGERY Right 03/05/2018  ? Dr. Veverly Fells.   ? THYROID LOBECTOMY Right 11/19/2018  ? THYROID LOBECTOMY Right 11/19/2018  ?  Procedure: RIGHT THYROID LOBECTOMY;  Surgeon: Ralene Ok, MD;  Location: [place];  Service: General;  Laterality: Right;  ? ? ?MEDICATIONS: ? clopidogrel (PLAVIX) 75 MG tablet  ? dapagliflozin propanediol (FARXIGA) 10 MG TABS tablet  ? glucose blood test strip  ? linagliptin (TRADJENTA) 5 MG TABS tablet  ? lisinopril (ZESTRIL) 10 MG tablet  ? metFORMIN (GLUCOPHAGE-XR) 500 MG 24 hr tablet  ? nitroGLYCERIN (NITROSTAT) 0.4 MG SL tablet  ? Omega-3 Fatty Acids (FISH OIL) 1000 MG CAPS  ? rosuvastatin (CRESTOR) 20 MG tablet  ? TRESIBA FLEXTOUCH 100 UNIT/ML FlexTouch Pen  ? ?No current facility-administered medications for this encounter.  ? ? ?Konrad Felix Ward, PA-C ?[facility] ?[phone] ? ? ? ? ? ? ?

## 2022-01-13 ENCOUNTER — Other Ambulatory Visit: Payer: Self-pay | Admitting: Interventional Cardiology

## 2022-01-19 ENCOUNTER — Ambulatory Visit (HOSPITAL_BASED_OUTPATIENT_CLINIC_OR_DEPARTMENT_OTHER): Payer: No Typology Code available for payment source | Admitting: Certified Registered Nurse Anesthetist

## 2022-01-19 ENCOUNTER — Encounter (HOSPITAL_COMMUNITY)
Admission: RE | Disposition: A | Discharge status: Home / Self Care | Payer: Self-pay | Source: Home / Self Care | Attending: Specialist

## 2022-01-19 ENCOUNTER — Ambulatory Visit (HOSPITAL_COMMUNITY): Payer: No Typology Code available for payment source | Admitting: Physician Assistant

## 2022-01-19 ENCOUNTER — Ambulatory Visit (HOSPITAL_COMMUNITY)
Admission: RE | Admit: 2022-01-19 | Discharge: 2022-01-19 | Disposition: A | Discharge status: Home / Self Care | Payer: No Typology Code available for payment source | Attending: Specialist | Admitting: Specialist

## 2022-01-19 ENCOUNTER — Other Ambulatory Visit: Payer: Self-pay

## 2022-01-19 ENCOUNTER — Encounter (HOSPITAL_COMMUNITY): Payer: Self-pay | Admitting: Specialist

## 2022-01-19 DIAGNOSIS — E1140 Type 2 diabetes mellitus with diabetic neuropathy, unspecified: Secondary | ICD-10-CM | POA: Diagnosis not present

## 2022-01-19 DIAGNOSIS — I25119 Atherosclerotic heart disease of native coronary artery with unspecified angina pectoris: Secondary | ICD-10-CM

## 2022-01-19 DIAGNOSIS — M7502 Adhesive capsulitis of left shoulder: Secondary | ICD-10-CM

## 2022-01-19 DIAGNOSIS — Z79899 Other long term (current) drug therapy: Secondary | ICD-10-CM | POA: Diagnosis not present

## 2022-01-19 DIAGNOSIS — M7542 Impingement syndrome of left shoulder: Secondary | ICD-10-CM | POA: Diagnosis not present

## 2022-01-19 DIAGNOSIS — Z7984 Long term (current) use of oral hypoglycemic drugs: Secondary | ICD-10-CM | POA: Insufficient documentation

## 2022-01-19 DIAGNOSIS — I2510 Atherosclerotic heart disease of native coronary artery without angina pectoris: Secondary | ICD-10-CM | POA: Diagnosis not present

## 2022-01-19 DIAGNOSIS — I10 Essential (primary) hypertension: Secondary | ICD-10-CM | POA: Insufficient documentation

## 2022-01-19 DIAGNOSIS — M75112 Incomplete rotator cuff tear or rupture of left shoulder, not specified as traumatic: Secondary | ICD-10-CM | POA: Diagnosis not present

## 2022-01-19 HISTORY — PX: SHOULDER ARTHROSCOPY: SHX128

## 2022-01-19 LAB — GLUCOSE, CAPILLARY
Glucose-Capillary: 122 mg/dL — ABNORMAL HIGH (ref 70–99)
Glucose-Capillary: 105 mg/dL — ABNORMAL HIGH (ref 70–99)

## 2022-01-19 SURGERY — ARTHROSCOPY, SHOULDER
Anesthesia: General | Site: Shoulder | Laterality: Left

## 2022-01-19 MED ORDER — PHENYLEPHRINE HCL-NACL 20-0.9 MG/250ML-% IV SOLN
INTRAVENOUS | Status: DC | PRN
  Administered 2022-01-19: 50 ug/min via INTRAVENOUS

## 2022-01-19 MED ORDER — ONDANSETRON HCL 4 MG/2ML IJ SOLN
INTRAMUSCULAR | Status: AC
  Filled 2022-01-19: qty 2

## 2022-01-19 MED ORDER — AMISULPRIDE (ANTIEMETIC) 5 MG/2ML IV SOLN: 10.0000 mg | Freq: Once | INTRAVENOUS | Status: DC | PRN

## 2022-01-19 MED ORDER — FENTANYL CITRATE (PF) 100 MCG/2ML IJ SOLN
INTRAMUSCULAR | Status: AC
  Filled 2022-01-19: qty 2

## 2022-01-19 MED ORDER — OXYCODONE HCL 5 MG/5ML PO SOLN: 5.0000 mg | Freq: Once | ORAL | Status: DC | PRN

## 2022-01-19 MED ORDER — FENTANYL CITRATE (PF) 100 MCG/2ML IJ SOLN
INTRAMUSCULAR | Status: DC | PRN
  Administered 2022-01-19: 25 ug via INTRAVENOUS

## 2022-01-19 MED ORDER — TRANEXAMIC ACID-NACL 1000-0.7 MG/100ML-% IV SOLN
1000.0000 mg | INTRAVENOUS | Status: AC
  Administered 2022-01-19: 1000 mg via INTRAVENOUS
  Filled 2022-01-19: qty 100

## 2022-01-19 MED ORDER — MIDAZOLAM HCL 2 MG/2ML IJ SOLN
INTRAMUSCULAR | Status: AC
  Filled 2022-01-19: qty 2

## 2022-01-19 MED ORDER — EPINEPHRINE PF 1 MG/ML IJ SOLN
INTRAMUSCULAR | Status: AC
  Filled 2022-01-19: qty 1

## 2022-01-19 MED ORDER — LACTATED RINGERS IV SOLN: INTRAVENOUS | Status: DC

## 2022-01-19 MED ORDER — DEXAMETHASONE SODIUM PHOSPHATE 10 MG/ML IJ SOLN
INTRAMUSCULAR | Status: AC
  Filled 2022-01-19: qty 1

## 2022-01-19 MED ORDER — BUPIVACAINE-EPINEPHRINE (PF) 0.5% -1:200000 IJ SOLN
INTRAMUSCULAR | Status: AC
  Filled 2022-01-19: qty 30

## 2022-01-19 MED ORDER — MIDAZOLAM HCL 2 MG/2ML IJ SOLN
1.0000 mg | INTRAMUSCULAR | Status: DC
  Administered 2022-01-19: 2 mg via INTRAVENOUS
  Filled 2022-01-19: qty 2

## 2022-01-19 MED ORDER — ASPIRIN EC 81 MG PO TBEC: 81.0000 mg | DELAYED_RELEASE_TABLET | Freq: Every day | ORAL | 1 refills | Status: AC

## 2022-01-19 MED ORDER — POLYETHYLENE GLYCOL 3350 17 G PO PACK: 17.0000 g | PACK | Freq: Every day | ORAL | 0 refills | Status: DC

## 2022-01-19 MED ORDER — PROPOFOL 10 MG/ML IV BOLUS
INTRAVENOUS | Status: DC | PRN
  Administered 2022-01-19: 180 mg via INTRAVENOUS
  Administered 2022-01-19: 20 mg via INTRAVENOUS

## 2022-01-19 MED ORDER — CHLORHEXIDINE GLUCONATE 0.12 % MT SOLN
15.0000 mL | Freq: Once | OROMUCOSAL | Status: AC
  Administered 2022-01-19: 15 mL via OROMUCOSAL

## 2022-01-19 MED ORDER — METHOCARBAMOL 500 MG PO TABS: 500.0000 mg | ORAL_TABLET | Freq: Three times a day (TID) | ORAL | 1 refills | Status: DC | PRN

## 2022-01-19 MED ORDER — PROPOFOL 10 MG/ML IV BOLUS
INTRAVENOUS | Status: AC
  Filled 2022-01-19: qty 20

## 2022-01-19 MED ORDER — ROCURONIUM BROMIDE 10 MG/ML (PF) SYRINGE
PREFILLED_SYRINGE | INTRAVENOUS | Status: AC
  Filled 2022-01-19: qty 10

## 2022-01-19 MED ORDER — ONDANSETRON HCL 4 MG/2ML IJ SOLN
INTRAMUSCULAR | Status: DC | PRN
  Administered 2022-01-19: 4 mg via INTRAVENOUS

## 2022-01-19 MED ORDER — SUGAMMADEX SODIUM 500 MG/5ML IV SOLN
INTRAVENOUS | Status: DC | PRN
  Administered 2022-01-19: 300 mg via INTRAVENOUS

## 2022-01-19 MED ORDER — MEPERIDINE HCL 50 MG/ML IJ SOLN: 6.2500 mg | INTRAMUSCULAR | Status: DC | PRN

## 2022-01-19 MED ORDER — CEFAZOLIN SODIUM-DEXTROSE 2-4 GM/100ML-% IV SOLN
2.0000 g | INTRAVENOUS | Status: AC
  Administered 2022-01-19: 2 g via INTRAVENOUS
  Filled 2022-01-19: qty 100

## 2022-01-19 MED ORDER — EPINEPHRINE PF 1 MG/ML IJ SOLN
INTRAMUSCULAR | Status: DC | PRN
  Administered 2022-01-19: 1 mg

## 2022-01-19 MED ORDER — PHENYLEPHRINE 40 MCG/ML (10ML) SYRINGE FOR IV PUSH (FOR BLOOD PRESSURE SUPPORT)
PREFILLED_SYRINGE | INTRAVENOUS | Status: AC
  Filled 2022-01-19: qty 10

## 2022-01-19 MED ORDER — ORAL CARE MOUTH RINSE: 15.0000 mL | Freq: Once | OROMUCOSAL | Status: AC

## 2022-01-19 MED ORDER — LACTATED RINGERS IR SOLN
Status: DC | PRN
  Administered 2022-01-19: 3000 mL

## 2022-01-19 MED ORDER — BUPIVACAINE-EPINEPHRINE 0.5% -1:200000 IJ SOLN
INTRAMUSCULAR | Status: DC | PRN
  Administered 2022-01-19: 20 mL

## 2022-01-19 MED ORDER — ROCURONIUM BROMIDE 10 MG/ML (PF) SYRINGE
PREFILLED_SYRINGE | INTRAVENOUS | Status: DC | PRN
Start: 2022-01-19 — End: 2022-01-19
  Administered 2022-01-19: 10 mg via INTRAVENOUS
  Administered 2022-01-19: 100 mg via INTRAVENOUS

## 2022-01-19 MED ORDER — OXYCODONE HCL 5 MG PO TABS: 5.0000 mg | ORAL_TABLET | Freq: Once | ORAL | Status: DC | PRN

## 2022-01-19 MED ORDER — PROMETHAZINE HCL 25 MG/ML IJ SOLN: 6.2500 mg | INTRAMUSCULAR | Status: DC | PRN

## 2022-01-19 MED ORDER — HYDROMORPHONE HCL 1 MG/ML IJ SOLN: 0.2500 mg | INTRAMUSCULAR | Status: DC | PRN

## 2022-01-19 MED ORDER — IBUPROFEN 800 MG PO TABS: 800.0000 mg | ORAL_TABLET | Freq: Three times a day (TID) | ORAL | 1 refills | Status: DC | PRN

## 2022-01-19 MED ORDER — DEXAMETHASONE SODIUM PHOSPHATE 10 MG/ML IJ SOLN
INTRAMUSCULAR | Status: DC | PRN
Start: 2022-01-19 — End: 2022-01-19
  Administered 2022-01-19: 4 mg via INTRAVENOUS

## 2022-01-19 MED ORDER — BUPIVACAINE HCL (PF) 0.5 % IJ SOLN
INTRAMUSCULAR | Status: DC | PRN
  Administered 2022-01-19: 20 mL via PERINEURAL

## 2022-01-19 MED ORDER — DOCUSATE SODIUM 100 MG PO CAPS: 100.0000 mg | ORAL_CAPSULE | Freq: Two times a day (BID) | ORAL | 1 refills | Status: DC | PRN

## 2022-01-19 MED ORDER — OXYCODONE HCL 5 MG PO TABS: 5.0000 mg | ORAL_TABLET | ORAL | 0 refills | Status: DC | PRN

## 2022-01-19 MED ORDER — FENTANYL CITRATE PF 50 MCG/ML IJ SOSY
50.0000 ug | PREFILLED_SYRINGE | INTRAMUSCULAR | Status: DC
  Administered 2022-01-19: 100 ug via INTRAVENOUS
  Filled 2022-01-19: qty 2

## 2022-01-19 MED ORDER — BUPIVACAINE LIPOSOME 1.3 % IJ SUSP
INTRAMUSCULAR | Status: DC | PRN
  Administered 2022-01-19: 10 mL via PERINEURAL

## 2022-01-19 SURGICAL SUPPLY — 70 items
ANCHOR NDL 9/16 CIR SZ 8 (NEEDLE) IMPLANT
ANCHOR NEEDLE 9/16 CIR SZ 8 (NEEDLE) IMPLANT
BAG COUNTER SPONGE SURGICOUNT (BAG) ×1 IMPLANT
BLADE EXCALIBUR 4.0X13 (MISCELLANEOUS) ×3 IMPLANT
BLADE SURG SZ11 CARB STEEL (BLADE) ×2 IMPLANT
BNDG COHESIVE 4X5 TAN ST LF (GAUZE/BANDAGES/DRESSINGS) ×2 IMPLANT
BURR OVAL 8 FLU 4.0X13 (MISCELLANEOUS) IMPLANT
CANNULA ACUFO 5X76 (CANNULA) ×2 IMPLANT
CLEANER TIP ELECTROSURG 2X2 (MISCELLANEOUS) IMPLANT
CLSR STERI-STRIP ANTIMIC 1/2X4 (GAUZE/BANDAGES/DRESSINGS) ×1 IMPLANT
COVER SURGICAL LIGHT HANDLE (MISCELLANEOUS) ×2 IMPLANT
DISSECTOR  3.8MM X 13CM (MISCELLANEOUS) ×2
DISSECTOR 3.5MM X 13CM (MISCELLANEOUS) IMPLANT
DISSECTOR 3.8MM X 13CM (MISCELLANEOUS) IMPLANT
DRAPE IMP U-DRAPE 54X76 (DRAPES) ×2 IMPLANT
DRAPE INCISE IOBAN 66X45 STRL (DRAPES) ×2 IMPLANT
DRAPE ORTHO SPLIT 77X108 STRL (DRAPES) ×4
DRAPE POUCH INSTRU U-SHP 10X18 (DRAPES) ×2 IMPLANT
DRAPE STERI 35X30 U-POUCH (DRAPES) ×2 IMPLANT
DRAPE SURG ORHT 6 SPLT 77X108 (DRAPES) ×2 IMPLANT
DRSG AQUACEL AG ADV 3.5X 4 (GAUZE/BANDAGES/DRESSINGS) IMPLANT
DRSG AQUACEL AG ADV 3.5X 6 (GAUZE/BANDAGES/DRESSINGS) ×1 IMPLANT
DRSG PAD ABDOMINAL 8X10 ST (GAUZE/BANDAGES/DRESSINGS) IMPLANT
DURAPREP 26ML APPLICATOR (WOUND CARE) ×2 IMPLANT
ELECT NDL TIP 2.8 STRL (NEEDLE) ×1 IMPLANT
ELECT NEEDLE TIP 2.8 STRL (NEEDLE) ×2 IMPLANT
ELECT REM PT RETURN 15FT ADLT (MISCELLANEOUS) ×2 IMPLANT
FILTER STRAW (MISCELLANEOUS) ×2 IMPLANT
GLOVE SRG 8 PF TXTR STRL LF DI (GLOVE) ×1 IMPLANT
GLOVE SURG POLYISO LF SZ7.5 (GLOVE) ×4 IMPLANT
GLOVE SURG POLYISO LF SZ8 (GLOVE) ×4 IMPLANT
GLOVE SURG UNDER POLY LF SZ7.5 (GLOVE) ×2 IMPLANT
GLOVE SURG UNDER POLY LF SZ8 (GLOVE) ×2
GOWN STRL REUS W/ TWL XL LVL3 (GOWN DISPOSABLE) ×2 IMPLANT
GOWN STRL REUS W/TWL XL LVL3 (GOWN DISPOSABLE) ×4
KIT BASIN OR (CUSTOM PROCEDURE TRAY) ×2 IMPLANT
KIT TURNOVER KIT A (KITS) ×1 IMPLANT
MANIFOLD NEPTUNE II (INSTRUMENTS) ×2 IMPLANT
NDL SCORPION MULTI FIRE (NEEDLE) IMPLANT
NDL SPNL 18GX3.5 QUINCKE PK (NEEDLE) ×1 IMPLANT
NEEDLE SCORPION MULTI FIRE (NEEDLE) ×2 IMPLANT
NEEDLE SPNL 18GX3.5 QUINCKE PK (NEEDLE) ×2 IMPLANT
PACK SHOULDER (CUSTOM PROCEDURE TRAY) ×2 IMPLANT
PORT APPOLLO RF 90DEGREE MULTI (SURGICAL WAND) ×3 IMPLANT
PROTECTOR NERVE ULNAR (MISCELLANEOUS) ×2 IMPLANT
RESTRAINT HEAD UNIVERSAL NS (MISCELLANEOUS) ×1 IMPLANT
SLING ARM FOAM STRAP LRG (SOFTGOODS) ×1 IMPLANT
SLING ARM IMMOBILIZER LRG (SOFTGOODS) ×1 IMPLANT
SLING ARM IMMOBILIZER MED (SOFTGOODS) IMPLANT
SLING ULTRA II L (ORTHOPEDIC SUPPLIES) ×1 IMPLANT
STRIP CLOSURE SKIN 1/2X4 (GAUZE/BANDAGES/DRESSINGS) ×1 IMPLANT
SUCTION FRAZIER HANDLE 12FR (TUBING) ×1
SUCTION TUBE FRAZIER 12FR DISP (TUBING) ×1 IMPLANT
SUT ETHIBOND NAB CT1 #1 30IN (SUTURE) ×1 IMPLANT
SUT ETHILON 4 0 PS 2 18 (SUTURE) ×2 IMPLANT
SUT FIBERWIRE #2 38 T-5 BLUE (SUTURE)
SUT PROLENE 3 0 PS 2 (SUTURE) ×2 IMPLANT
SUT TIGER TAPE 7 IN WHITE (SUTURE) IMPLANT
SUT VIC AB 0 CT1 27 (SUTURE) ×1
SUT VIC AB 0 CT1 27XBRD ANTBC (SUTURE) ×1 IMPLANT
SUT VIC AB 1-0 CT2 27 (SUTURE) IMPLANT
SUT VIC AB 2-0 CT2 27 (SUTURE) IMPLANT
SUT VICRYL 0 UR6 27IN ABS (SUTURE) ×2 IMPLANT
SUTURE FIBERWR #2 38 T-5 BLUE (SUTURE) IMPLANT
SYR 20ML LL LF (SYRINGE) ×2 IMPLANT
TAPE FIBER 2MM 7IN #2 BLUE (SUTURE) IMPLANT
TOWEL OR 17X26 10 PK STRL BLUE (TOWEL DISPOSABLE) ×2 IMPLANT
TUBING ARTHROSCOPY IRRIG 16FT (MISCELLANEOUS) ×2 IMPLANT
TUBING CONNECTING 10 (TUBING) ×4 IMPLANT
WIPE CHG CHLORHEXIDINE 2% (PERSONAL CARE ITEMS) ×2 IMPLANT

## 2022-01-19 NOTE — Anesthesia Procedure Notes (Addendum)
Procedure Name: Intubation ?Date/Time: 01/19/2022 10:06 AM ?Performed by: West Pugh, CRNA ?Pre-anesthesia Checklist: Patient identified, Emergency Drugs available, Suction available, Patient being monitored and Timeout performed ?Patient Re-evaluated:Patient Re-evaluated prior to induction ?Oxygen Delivery Method: Circle system utilized ?Preoxygenation: Pre-oxygenation with 100% oxygen ?Induction Type: IV induction ?Ventilation: Mask ventilation without difficulty ?Laryngoscope Size: Mac and 4 ?Grade View: Grade II ?Tube type: Oral ?Tube size: 7.5 mm ?Number of attempts: 1 ?Airway Equipment and Method: Stylet ?Placement Confirmation: ETT inserted through vocal cords under direct vision, positive ETCO2, CO2 detector and breath sounds checked- equal and bilateral ?Secured at: 22 cm ?Tube secured with: Tape ?Dental Injury: Teeth and Oropharynx as per pre-operative assessment  ? ? ? ? ?

## 2022-01-19 NOTE — Brief Op Note (Addendum)
01/19/2022 ? ?11:57 AM ? ?PATIENT:  Eddie Garner  61 y.o. male ? ?PRE-OPERATIVE DIAGNOSIS:  Left shoulder adhesive capulitis, Impingement ? ?POST-OPERATIVE DIAGNOSIS:  Left shoulder adhesive capulitis, Impingement ? ?PROCEDURE:  Procedure(s) with comments: ?Left shoulder arthroscopy, manipulation under anesthesia, evaluation under anesthesia, debridement (Left) - Regional block ?DIAGNOSES: ?Left shoulder, chronic rotator cuff tear, subacromial impingement, and adhesive capsulitis . ? ?POST-OPERATIVE DIAGNOSIS: same ? ?PROCEDURE: ?Arthroscopic extensive debridement - [place] ?Subacromial bursa , supraspinatous tendon ?  ?OPERATIVE FINDING: ?Exam under anesthesia:  Patient had slightly decreased forward flexion, abduction as well as internal and external rotation ?Articular space:  Glenohumeral arthrosis was noted ?Chondral surfaces: Chondral wearing was noted on the humeral head as well as the glenoid. ?Biceps: Bicipital tendinosis ?Subscapularis: Degenerative fraying of the subscaSupraspinatus: Partial tear of the supraspinatus ?Infraspinatus: Intact   ? ?SURGEON:  Surgeon(s) and Role: ?   Susa Day, MD - Primary ? ?PHYSICIAN ASSISTANT:  ? ?ASSISTANTS: [place]  ? ?ANESTHESIA:   general ? ?EBL:  min  ? ?BLOOD ADMINISTERED:none ? ?DRAINS: none  ? ?LOCAL MEDICATIONS USED:  [place]    ? ?SPECIMEN:  No Specimen ? ?DISPOSITION OF SPECIMEN:  N/A ? ?COUNTS:  YES ? ?TOURNIQUET:  * No tourniquets in log * ? ?DICTATION: .Other Dictation: Dictation Number [id number].... ? ?  ? ? ?PLAN OF CARE: Discharge to home after PACU ? ?PATIENT DISPOSITION:  PACU - hemodynamically stable. ?  ?Delay start of Pharmacological VTE agent (>24hrs) due to surgical blood loss or risk of bleeding: no ? ?

## 2022-01-19 NOTE — Op Note (Signed)
NAME: Eddie Garner, Eddie V. ?MEDICAL RECORD NO: [id number] ?ACCOUNT NO: [id number] ?DATE OF BIRTH: Sep 05, 1961 ?FACILITY: [facility] ?LOCATION: [facility] ?PHYSICIAN: Johnn Hai, MD ? ?Operative Report  ? ?DATE OF PROCEDURE: 01/19/2022 ? ?PREOPERATIVE DIAGNOSES:  ?1.  Impingement syndrome, left shoulder. ?2.  Adhesive capsulitis, left shoulder. ? ?POSTOPERATIVE DIAGNOSES:    ?1.  Impingement syndrome, left shoulder. ?2.  Mild adhesive capsulitis, left shoulder. ?3.  Bursal side tear of the supraspinatus. ?4.  Glenohumeral arthritis. ? ?PROCEDURE PERFORMED:    ?1.  Exam followed by manipulation under anesthesia, left shoulder. ?2.  Left shoulder arthroscopy with subacromial decompression, release of CA ligament. ?3.  Extensive bursectomy subdeltoid, subacromial. ?4.  Debridement of the supraspinatus tendon. ?5.  Lavage of the glenohumeral joint. ? ?ANESTHESIA:  General. ? ?ASSISTANT:  Lacie Draft, PA. ? ?HISTORY:  This is a 61 year old male who is status post rotator cuff repair.  His postoperative course was challenged by cervical radiculopathy, subsequently requiring anterior cervical diskectomy and fusion.  Due to limitations in range of motion of the ? shoulder due to his neuropathy, he developed suboptimal range of motion.  MRI indicating bursal side tearing of the rotator cuff, glenohumeral arthrosis, bicipital tendinosis, but no full thickness recurrent tear.  Failing conservative treatment, we  ?discussed manipulation under anesthesia, rotator cuff evaluation and possible debridement, possible repair and debridement.  Risks and benefits were discussed including bleeding, infection, damage to neurovascular structures, no change in symptoms,  ?worsening symptoms, DVT, PE, anesthetic complications, etc. ? ?TECHNIQUE:  The patient placed in supine position, after induction of adequate general anesthesia, 2 grams Kefzol, a timeout was then performed.  I then examined the patient's range of motion under anesthesia.   Patient had external rotation of 20 degrees ? left and 45 degrees on the right.  Abduction was 90 degrees left and 120 on the right.  Internal rotation and abduction was diminished by 20 degrees as well.  Forward flexion was slightly decreased by 10 degrees on the left compared to the right.  With  ?the scapulothoracic region stabilized, I performed a gentle manipulation, first forward flexion, followed by abduction.  Some slight improvement was noted.  We performed internal and external rotation maneuvers with the scapulothoracic region stabilized, ? grasping the humerus proximally, translating the humerus anteriorly and posteriorly.  Following this, we placed him in the upright beach chair position.  Left shoulder and upper extremity was prepped and draped in the usual sterile fashion.  A second  ?timeout was performed.  A surgical marker was utilized to delineate the acromion, AC joint and coracoid.  A standard posterolateral portal was utilized with an incision through the skin only.  We advanced the cannula towards the glenohumeral joint in  ?line with the coracoid with the arm in 70/30 position with gentle traction and muscular relaxation.  Able to appreciated the rotation of the humeral head and the glenohumeral joint.  It was fairly tight posteriorly.  We had issue with the cannula, which  ?was leaking irrigant.  This necessitated removal of the cannula, disassembling the introducer, and reassembling that.  Multiple reassemblies then reduced the leakage to the point it was acceptable to use.  I then advanced the cannula into the subacromial ? space first.  I made a anterolateral portal with an incision through the skin only with an 11 blade and triangulated in the subacromial space.  Exuberant hypertrophic bursa was noted throughout.  I introduced the shaver and performed extensive  ?bursectomy, both subdeltoid and subacromial with  the arm in the neutral position, internal and externally rotating it,   performing the bursectomy, anterior posteriorly and superiorly as well as laterally.  Inspection of the cuff revealed a very small  ?bursal side tearing of the supraspinatus, perhaps 10%.  I debrided that with a shaver.  Good bleeding tissue.  Infraspinatus was unremarkable.  The attachment of the rotator cuff was intact.  The coracoacromial ligament was hypertrophic and thickened.  I ? released the CA ligament and morcellized a portion of that.  A small spur off the anterolateral aspect of the acromion was shaved and planed, preserving the deltoid attachment.  We carried this out to the [facility] joint.  After full inspection of the rotator  ?cuff and the remaining of the subdeltoid subacromial space, I felt we had excellent debridement, bursectomy and improvement in the subacromial space with release of the CA ligament. ? ?Next, we removed the arthroscopic cannula from the subacromial space and again reattempted the glenohumeral space.  With the arm in a neutral position and traction applied to it, we advanced the cannula from the posterior portal in line with the  ?coracoid.  After a few attempts, we were able to gently gain access to the glenohumeral space.  Moderate glenohumeral arthrosis was noted within the joint.  Hypertrophic synovium.  Cartilaginous debris.  We lavaged the joint.  There was a fair amount of  ?synovitis and the glenohumeral joint was fairly tight.  Under direct visualization, we then attempted to fashion an anterior portal after localization with 18-gauge needle, anterior to the [facility] joint.  Due to the hypertrophic synovium, visualization was  ?suboptimal.  I then attempted utilizing a switching stick to localize the anterior portal and the switching stick, we were unable to pass it anteriorly, most likely secondary to the decrease in the glenohumeral space.  This was then removed and  ?reinserted our camera.  I made another attempt to fashion the anterior portal just for lavage and debridement and  again, the space was fairly tight.  He did have some degenerative fraying of the labrum.  The rotator cuff looked good.  Subscap had some  ?longitudinal tearing in it.  Biceps appeared to be hypertrophic as well.  I then decided to forego this anterior portal feeling that the progression of glenohumeral arthrosis was responsible for a reduction in his range of motion as well.  After thorough ? lavage of the glenohumeral joint, I removed the instrumentation.  Portals were closed with 4-0 nylon simple sutures.  0.25% Marcaine with epinephrine was infiltrated in the subacromial spaces and over the portals.  Sterile dressing applied, placed in a  ?sling.  At the end of that, we examined his range of motion.  We had a slight improvement.  We had a modest improvement in his external, internal rotation.  Near full forward flexion and abduction.  I performed a slight manipulation at the end of this  ?case as well, both in abduction and forward flexion and internal rotation with the scapulothoracic region fully stabilized. ? ?Following this, he was placed in a sling, extubated without difficulty and transported to the recovery room in satisfactory condition. ? ?The patient tolerated the procedure well.  ? ?COMPLICATIONS: No complications.  ? ?BLOOD LOSS: Minimal blood loss. ? ? ?MUK ?D: 01/19/2022 12:11:06 pm T: 01/19/2022 9:54:00 pm  ?JOB: [id number]/ [id number]  ?

## 2022-01-19 NOTE — Anesthesia Procedure Notes (Signed)
Anesthesia Regional Block: Interscalene brachial plexus block  ? ?Pre-Anesthetic Checklist: , timeout performed,  Correct Patient, Correct Site, Correct Laterality,  Correct Procedure, Correct Position, site marked,  Risks and benefits discussed,  Surgical consent,  Pre-op evaluation,  At surgeon's request and post-op pain management ? ?Laterality: Left ? ?Prep: chloraprep     ?  ?Needles:  ?Injection technique: Single-shot ? ?Needle Type: Stimiplex   ? ? ?Needle Length: 9cm  ?Needle Gauge: 21  ? ? ? ?Additional Needles: ? ? ?Procedures:,,,, ultrasound used (permanent image in chart),,    ?Narrative:  ?Start time: 01/19/2022 9:24 AM ?End time: 01/19/2022 9:29 AM ?Injection made incrementally with aspirations every 5 mL. ? ?Performed by: Personally  ?Anesthesiologist: [name], MD ? ? ? ? ?

## 2022-01-19 NOTE — Progress Notes (Signed)
Assisted Dr. Miller with left, interscalene , ultrasound guided block. Side rails up, monitors on throughout procedure. See vital signs in flow sheet. Tolerated Procedure well. 

## 2022-01-19 NOTE — Discharge Instructions (Signed)
SHOULDER ARTHROSCOPY POSTOPERATIVE INSTRUCTIONS FOR DR. Tonita Cong ? ?1.  Ice pack on shoulder 3-4 times per day. ? ?2.  May remove bandages in 72 hours and apply band-aids to sutures. ? ?3,  May get out of sling in AM and start gentle pendulum exercises.  You are encouraged       to move the elbow, wrist and hand. ? ?4.  Elevate operative shoulder and elbow on pillow. ? ?5.  Exercise your fingers to help reduce swelling. ? ?6.  Report to your doctor should any of the following situations occur: ? ? -Swelling of your fingers. ? -Inability to wiggle your fingers. ? -Coldness, turning pale or blueness of your fingers. ? -Loss of sensation, numbness or tingling of your fingers. ? -Unusual small or odor from under dressing. ? -Excessive bleeding or drainage from the surgical site(s). ? -Severe pain which is not relieved by the pain medication your doctor prescribed                for you. ? ?7.  Call the office to schedule and appointment for 10-14 days . ?8.  Take one aspirin per day '325mg'$  with a meal if not on another blood thinner or allergic. ? ?Patient Signature:  ________________________________________________________ ? ?Nurse's Signature:  ________________________________________________________  ?

## 2022-01-19 NOTE — Interval H&P Note (Signed)
History and Physical Interval Note: ? ?01/19/2022 ?9:23 AM ? ?Eddie Garner  has presented today for surgery, with the diagnosis of Left shoulder adhesive capulitis, Impingement.  The various methods of treatment have been discussed with the patient and family. After consideration of risks, benefits and other options for treatment, the patient has consented to  Procedure(s) with comments: ?Left shoulder arthroscopy, manipulation under anesthesia, evaluation under anesthesia, debridement (Left) - Regional block as a surgical intervention.  The patient's history has been reviewed, patient examined, no change in status, stable for surgery.  I have reviewed the patient's chart and labs.  Questions were answered to the patient's satisfaction.   ? ? ?Johnn Hai ? ? ?

## 2022-01-19 NOTE — Transfer of Care (Signed)
Immediate Anesthesia Transfer of Care Note ? ?Patient: Eddie Garner ? ?Procedure(s) Performed: Left shoulder arthroscopy, manipulation under anesthesia, evaluation under anesthesia, debridement (Left: Shoulder) ? ?Patient Location: PACU ? ?Anesthesia Type:GA combined with regional for post-op pain ? ?Level of Consciousness: awake and patient cooperative ? ?Airway & Oxygen Therapy: Patient Spontanous Breathing and Patient connected to face mask oxygen ? ?Post-op Assessment: Report given to RN and Post -op Vital signs reviewed and stable ? ?Post vital signs: Reviewed and stable ? ?Last Vitals:  ?Vitals Value Taken Time  ?BP 130/89 01/19/22 1207  ?Temp    ?Pulse 72 01/19/22 1210  ?Resp 18 01/19/22 1210  ?SpO2 99 % 01/19/22 1210  ?Vitals shown include unvalidated device data. ? ?Last Pain:  ?Vitals:  ? 01/19/22 0921  ?TempSrc:   ?PainSc: 0-No pain  ?   ? ?Patients Stated Pain Goal: 3 (01/19/22 0845) ? ?Complications: No notable events documented. ?

## 2022-01-20 ENCOUNTER — Encounter (HOSPITAL_COMMUNITY): Payer: Self-pay | Admitting: Specialist

## 2022-01-20 NOTE — Anesthesia Postprocedure Evaluation (Signed)
Anesthesia Post Note ? ?Patient: Eddie Garner ? ?Procedure(s) Performed: Left shoulder arthroscopy, manipulation under anesthesia, evaluation under anesthesia, debridement (Left: Shoulder) ? ?  ? ?Patient location during evaluation: PACU ?Anesthesia Type: General ?Level of consciousness: awake and alert ?Pain management: pain level controlled ?Vital Signs Assessment: post-procedure vital signs reviewed and stable ?Respiratory status: spontaneous breathing, nonlabored ventilation and respiratory function stable ?Cardiovascular status: blood pressure returned to baseline and stable ?Postop Assessment: no apparent nausea or vomiting ?Anesthetic complications: no ? ? ?No notable events documented. ? ?Last Vitals:  ?Vitals:  ? 01/19/22 1245 01/19/22 1258  ?BP: 138/86 (!) 155/79  ?Pulse: 74 74  ?Resp: 16 16  ?Temp: 36.6 ?C   ?SpO2: 93% 94%  ?  ?Last Pain:  ?Vitals:  ? 01/19/22 1258  ?TempSrc:   ?PainSc: 0-No pain  ? ?Pain Goal: Patients Stated Pain Goal: 3 (01/19/22 0845) ? ?  ?  ?  ?  ?  ?  ?  ? ?[name] ? ? ? ? ?

## 2022-04-15 ENCOUNTER — Other Ambulatory Visit: Payer: Self-pay | Admitting: Interventional Cardiology

## 2022-05-30 ENCOUNTER — Telehealth: Payer: Self-pay | Admitting: *Deleted

## 2022-05-30 NOTE — Telephone Encounter (Signed)
Pt agreeable to plan of care for tele pre op appt 06/13/22 @ 2 pm. Med rec and consent are done.    Patient Consent for Virtual Visit        Eddie Garner has provided verbal consent on 05/30/2022 for a virtual visit (video or telephone).   CONSENT FOR VIRTUAL VISIT FOR:  Eddie Garner  By participating in this virtual visit I agree to the following:  I hereby voluntarily request, consent and authorize [facility] and its employed or contracted physicians, physician assistants, nurse practitioners or other licensed health care professionals (the Practitioner), to provide me with telemedicine health care services (the "Services") as deemed necessary by the treating Practitioner. I acknowledge and consent to receive the Services by the Practitioner via telemedicine. I understand that the telemedicine visit will involve communicating with the Practitioner through live audiovisual communication technology and the disclosure of certain medical information by electronic transmission. I acknowledge that I have been given the opportunity to request an in-person assessment or other available alternative prior to the telemedicine visit and am voluntarily participating in the telemedicine visit.  I understand that I have the right to withhold or withdraw my consent to the use of telemedicine in the course of my care at any time, without affecting my right to future care or treatment, and that the Practitioner or I may terminate the telemedicine visit at any time. I understand that I have the right to inspect all information obtained and/or recorded in the course of the telemedicine visit and may receive copies of available information for a reasonable fee.  I understand that some of the potential risks of receiving the Services via telemedicine include:  Delay or interruption in medical evaluation due to technological equipment failure or disruption; Information transmitted may not be sufficient (e.g.  poor resolution of images) to allow for appropriate medical decision making by the Practitioner; and/or  In rare instances, security protocols could fail, causing a breach of personal health information.  Furthermore, I acknowledge that it is my responsibility to provide information about my medical history, conditions and care that is complete and accurate to the best of my ability. I acknowledge that Practitioner's advice, recommendations, and/or decision may be based on factors not within their control, such as incomplete or inaccurate data provided by me or distortions of diagnostic images or specimens that may result from electronic transmissions. I understand that the practice of medicine is not an exact science and that Practitioner makes no warranties or guarantees regarding treatment outcomes. I acknowledge that a copy of this consent can be made available to me via my patient portal ([facility]), or I can request a printed copy by calling the office of [facility].    I understand that my insurance will be billed for this visit.   I have read or had this consent read to me. I understand the contents of this consent, which adequately explains the benefits and risks of the Services being provided via telemedicine.  I have been provided ample opportunity to ask questions regarding this consent and the Services and have had my questions answered to my satisfaction. I give my informed consent for the services to be provided through the use of telemedicine in my medical care

## 2022-05-30 NOTE — Telephone Encounter (Signed)
   Pre-operative Risk Assessment    Patient Name: Eddie Garner  DOB: May 31, 1961 MRN: [id number]      Request for Surgical Clearance    Procedure:   ENDOSCOPY / COLONSCOPY  Date of Surgery:  Clearance 06/29/22                                 Surgeon:   Surgeon's Group or Practice Name:  [facility]   Phone number:  [phone] Fax number:  [phone]   Type of Clearance Requested:   - Pharmacy:  Hold Clopidogrel (Plavix) X'S 4 DAYS   Type of Anesthesia:  Not Indicated   Additional requests/questions:    [name]   05/30/2022, 11:41 AM

## 2022-05-30 NOTE — Telephone Encounter (Signed)
Pt agreeable to plan of care for tele pre op appt 06/13/22 @ 2 pm. Med rec and consent are done.

## 2022-05-30 NOTE — Telephone Encounter (Signed)
His Plavix may be held for 4 days prior to his procedure.  Please resume as soon as hemostasis is achieved  Preoperative team, please contact this patient and set up a phone call appointment for further cardiac evaluation.  Thank you for your help.  Jossie Ng. Stewart Sasaki NP-C    05/30/2022, 2:17 PM [facility] [place] [address] Office [phone] Fax [phone]

## 2022-06-13 ENCOUNTER — Ambulatory Visit (INDEPENDENT_AMBULATORY_CARE_PROVIDER_SITE_OTHER): Payer: 59 | Admitting: Student

## 2022-06-13 DIAGNOSIS — Z01810 Encounter for preprocedural cardiovascular examination: Secondary | ICD-10-CM | POA: Diagnosis not present

## 2022-06-13 NOTE — Telephone Encounter (Signed)
OUR OFFICE RECEIVED CLEARANCE REQUEST AGAIN TODAY WITH A NOTE STATING THIS IS THEIR 3RD ATTEMPT REQUESTING CLEARANCE. PROCEDURE IS 06/29/22.   WE HAVE SENT NOTES TO REQUESTING OFFICE TO INFORM THEM THE PT HAS APPT 06/13/22 FOR PRE OP ASSESSMENT.  ONCE THE PT HAS BEEN CLEARED WE WILL BE SURE TO FAX THE CLEARANCE AND ANY RECOMMENDATIONS TO THE REQUESTING OFFICE.

## 2022-06-13 NOTE — Progress Notes (Signed)
Virtual Visit via Telephone Note   Because of Eddie Garner Mode's co-morbid illnesses, he is at least at moderate risk for complications without adequate follow up.  This format is felt to be most appropriate for this patient at this time.  The patient did not have access to video technology/had technical difficulties with video requiring transitioning to audio format only (telephone).  All issues noted in this document were discussed and addressed.  No physical exam could be performed with this format.  Please refer to the patient's chart for his consent to telehealth for Eddie Garner.  Evaluation Performed:  Preoperative Cardiovascular Risk Assessment _____________   Date:  06/13/2022   Patient ID:  Eddie Garner, DOB June 30, 1961, MRN [id number] Patient Location:  Home Provider location:   Office  Primary Care Provider:  Medicine, [facility] Primary Cardiologist:  Larae Grooms, MD  Chief Complaint / Patient Profile   Eddie Garner is a 61 y.o. year old male with a history of CAD s/p DES to LAD in 2009, hypertension, hyperlipidemia, type 2 diabetes mellitus who is pending a endoscopy/ colonscopy and presents today for telephonic preoperative cardiovascular risk assessment.  Past Medical History    Past Medical History:  Diagnosis Date   Angina pectoris (HCC)    Coronary artery disease    Diabetes (HCC)    HTN (hypertension)    Hypercholesteremia    Hyperlipidemia    Obesity    Pain in joint    Pneumonia    Right thyroid nodule    Past Surgical History:  Procedure Laterality Date   ANTERIOR CERVICAL DECOMP/DISCECTOMY FUSION N/A 05/05/2021   Procedure: ANTERIOR CERVICAL DECOMPRESSION/DISCECTOMY FUSION 3 LEVELS C4-7;  Surgeon: Melina Schools, MD;  Location: [place];  Service: Orthopedics;  Laterality: N/A;  3.5 hrs   APPENDECTOMY     CARDIAC CATHETERIZATION     CORONARY ANGIOPLASTY     DES LAD 10/28/08 (Dr. Irish Lack, [facility])   [place] Left  07/09/2018   Dr. Caralyn Guile   FOOT SURGERY     HAND SURGERY Left 07/09/2018   Dr. Caralyn Guile    SHOULDER ARTHROSCOPY Left 01/19/2022   Procedure: Left shoulder arthroscopy, manipulation under anesthesia, evaluation under anesthesia, debridement;  Surgeon: Susa Day, MD;  [facility];  Service: Orthopedics;  Laterality: Left;  Regional block   SHOULDER ARTHROSCOPY WITH ROTATOR CUFF REPAIR AND SUBACROMIAL DECOMPRESSION Left 04/16/2020   Procedure: SHOULDER ARTHROSCOPY WITH MINI OPEN ROTATOR CUFF REPAIR AND SUBACROMIAL DECOMPRESSION;  Surgeon: Susa Day, MD;  [facility];  Service: Orthopedics;  Laterality: Left;  90 MINS   SHOULDER SURGERY Right 03/05/2018   Dr. Veverly Fells.    THYROID LOBECTOMY Right 11/19/2018   THYROID LOBECTOMY Right 11/19/2018   Procedure: RIGHT THYROID LOBECTOMY;  Surgeon: Ralene Ok, MD;  Location: [place];  Service: General;  Laterality: Right;    Allergies  No Known Allergies  History of Present Illness    Eddie Garner is a 61 y.o. male who presents via audio/video conferencing for a telehealth visit today.  Patient was last seen in our office on 12/15/2021 by Estella Husk, PA-C.  At that time, he was doing well from a cardiac standpoint. He is now pending procedure as outlined above. Since his last visit, he has been doing well. He denies any chest pain, shortness of breath, orthopnea, PND, lower extremity edema, palpitations, lightheadedness/dizziness, syncope. He is staying active and able to complete >4.0 METS without any problems.  Home Medications  Prior to Admission medications   Medication Sig Start Date End Date Taking? Authorizing Provider  aspirin EC 81 MG tablet Take 1 tablet (81 mg total) by mouth daily. Day after surgery 01/20/22   Susa Day, MD  clopidogrel (PLAVIX) 75 MG tablet TAKE 1 TABLET BY MOUTH EVERY DAY WITH BREAKFAST 04/15/22   Jettie Booze, MD  dapagliflozin propanediol (FARXIGA) 10 MG TABS tablet Take 10 mg by  mouth daily with lunch.     [provider]  docusate sodium (COLACE) 100 MG capsule Take 1 capsule (100 mg total) by mouth 2 (two) times daily as needed for mild constipation. 01/19/22   Susa Day, MD  glucose blood test strip 1 each by Other route as needed for other. Use as instructed    [provider]  ibuprofen (ADVIL) 800 MG tablet Take 1 tablet (800 mg total) by mouth 3 (three) times daily with meals as needed for moderate pain. 01/19/22   Susa Day, MD  linagliptin (TRADJENTA) 5 MG TABS tablet Take 5 mg by mouth daily.    [provider]  lisinopril (ZESTRIL) 10 MG tablet Take 1 tablet (10 mg total) by mouth daily. 01/14/21   Jettie Booze, MD  metFORMIN (GLUCOPHAGE-XR) 500 MG 24 hr tablet Take 1,000 mg by mouth 2 (two) times daily.    [provider]  methocarbamol (ROBAXIN) 500 MG tablet Take 1 tablet (500 mg total) by mouth every 8 (eight) hours as needed for muscle spasms. 01/19/22   Susa Day, MD  nitroGLYCERIN (NITROSTAT) 0.4 MG SL tablet Place 0.4 mg under the tongue every 5 (five) minutes as needed for chest pain.     [provider]  Omega-3 Fatty Acids (FISH OIL) 1000 MG CAPS Take 1,000 mg by mouth 2 (two) times a week.    [provider]  oxyCODONE (OXY IR/ROXICODONE) 5 MG immediate release tablet Take 1 tablet (5 mg total) by mouth every 4 (four) hours as needed for severe pain. 01/19/22   Susa Day, MD  polyethylene glycol (MIRALAX / GLYCOLAX) 17 g packet Take 17 g by mouth daily. 01/19/22   Susa Day, MD  rosuvastatin (CRESTOR) 20 MG tablet TAKE 1 TABLET BY MOUTH EVERY DAY 01/13/22   Jettie Booze, MD  TRESIBA FLEXTOUCH 100 UNIT/ML FlexTouch Pen Inject 50 Units into the skin daily. 03/28/21   [provider]    Physical Exam    Vital Signs:  [name] does not have vital signs available for review today.  Given telephonic nature of communication, physical exam is  limited. Alert and oriented x3. No acute distress. Normal affect. Speech and respirations are unlabored.  Accessory Clinical Findings    None  Assessment & Plan    Preoperative Cardiovascular Risk Assessment: Patient has an upcoming endoscopy/ colonoscopy planned. He is stable from a cardiac standpoint with with no angina, shortness of breath, acute CHF symptoms, palpitations, or syncope. He is able to complete >4.0 METS without any problems. Therefore, based on ACC/AHA guidelines, patient would be at acceptable risk for the planned procedure without further cardiovascular testing. Patient has previously been given the OK to hold Plavix for 5 days prior to previous surgeries. He has had no new cardiac events since that time; therefore, same recommendation can be used. OK to hold Plavix for 5 days prior to endoscopy/ colonoscopy but please restart as soon as safely possible following procedure. I will route this recommendation to the requesting party via Epic fax function.  Time:   Today, I have spent 3 minutes with the patient with telehealth technology discussing medical history, symptoms, and management plan.     Darreld Mclean, PA-C  06/13/2022, 2:01 PM

## 2022-07-20 ENCOUNTER — Encounter: Payer: Self-pay | Admitting: Neurology

## 2022-07-20 ENCOUNTER — Ambulatory Visit (INDEPENDENT_AMBULATORY_CARE_PROVIDER_SITE_OTHER): Payer: 59 | Admitting: Neurology

## 2022-07-20 VITALS — BP 138/78 | HR 77 | Ht 70.0 in | Wt 239.0 lb

## 2022-07-20 DIAGNOSIS — I639 Cerebral infarction, unspecified: Secondary | ICD-10-CM | POA: Diagnosis not present

## 2022-07-20 MED ORDER — ROSUVASTATIN CALCIUM 40 MG PO TABS: 40.0000 mg | ORAL_TABLET | Freq: Every day | ORAL | 3 refills | Status: DC

## 2022-07-20 NOTE — Progress Notes (Signed)
[facility]  PATIENT: Eddie Garner DOB: 01-Oct-1961  REQUESTING CLINICIAN: Medicine, [facility]* HISTORY FROM: Patient, spouse and chart review  REASON FOR VISIT: Stroke follow up    HISTORICAL  CHIEF COMPLAINT:  Chief Complaint  Patient presents with   New Patient (Initial Visit)    Rm 13. Accompanied by wife. NP [facility] ED referral for CVA f/u.    HISTORY OF PRESENT ILLNESS:  This is a 61 year old gentleman past medical history of hypertension, hyperlipidemia, diabetes mellitus and CAD s/p stent on Plavix who is presenting after being admitted to the hospital for a left medullary stroke.  Patient reported experiencing dizziness and  double vision 4 days prior to the presentation.  He initially presented to his primary care doctor who referred him to the ED for stroke rule out.  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  He is CTA also found intracranial narrowing.  Patient was on Plavix for CAD indication and aspirin was added. His echocardiogram showed an EF 60-65%. He was seen by PT OT and speech and not deemed to be a candidate for acute rehab. Since discharge from the hospital, he continued to have mild blurred vision and dizziness.  He reported when walking, he stumbles but denies any falls.     OTHER MEDICAL CONDITIONS: Hypertension, hyperlipidemia, diabetes mellitus, CAD.   REVIEW OF SYSTEMS: Full 14 system review of systems performed and negative with exception of: As noted in the HPI   ALLERGIES: No Known Allergies  HOME MEDICATIONS: Outpatient Medications Prior to Visit  Medication Sig Dispense Refill   aspirin EC 81 MG tablet Take 1 tablet (81 mg total) by mouth daily. Day after surgery 30 tablet 1   clopidogrel (PLAVIX) 75 MG tablet TAKE 1 TABLET BY MOUTH EVERY DAY WITH BREAKFAST 90 tablet 2   dapagliflozin propanediol (FARXIGA) 10 MG TABS tablet Take 10 mg by mouth daily with lunch.      glucose blood test strip 1  each by Other route as needed for other. Use as instructed     lisinopril (ZESTRIL) 10 MG tablet Take 1 tablet (10 mg total) by mouth daily. 90 tablet 3   metFORMIN (GLUCOPHAGE-XR) 500 MG 24 hr tablet Take 1,000 mg by mouth 2 (two) times daily.     nitroGLYCERIN (NITROSTAT) 0.4 MG SL tablet Place 0.4 mg under the tongue every 5 (five) minutes as needed for chest pain.      Omega-3 Fatty Acids (FISH OIL) 1000 MG CAPS Take 1,000 mg by mouth 2 (two) times a week.     Semaglutide,0.25 or 0.'5MG'$ /DOS, (OZEMPIC, 0.25 OR 0.5 MG/DOSE,) 2 MG/3ML SOPN Inject 0.25 mg as directed once a week.     TRESIBA FLEXTOUCH 100 UNIT/ML FlexTouch Pen Inject 50 Units into the skin daily.     rosuvastatin (CRESTOR) 20 MG tablet TAKE 1 TABLET BY MOUTH EVERY DAY 90 tablet 3   docusate sodium (COLACE) 100 MG capsule Take 1 capsule (100 mg total) by mouth 2 (two) times daily as needed for mild constipation. 30 capsule 1   ibuprofen (ADVIL) 800 MG tablet Take 1 tablet (800 mg total) by mouth 3 (three) times daily with meals as needed for moderate pain. 30 tablet 1   linagliptin (TRADJENTA) 5 MG TABS tablet Take 5 mg by mouth daily.     methocarbamol (ROBAXIN) 500 MG tablet Take 1 tablet (500 mg total) by mouth every 8 (eight) hours as needed for muscle spasms.  40 tablet 1   oxyCODONE (OXY IR/ROXICODONE) 5 MG immediate release tablet Take 1 tablet (5 mg total) by mouth every 4 (four) hours as needed for severe pain. 40 tablet 0   polyethylene glycol (MIRALAX / GLYCOLAX) 17 g packet Take 17 g by mouth daily. 14 each 0   No facility-administered medications prior to visit.    PAST MEDICAL HISTORY: Past Medical History:  Diagnosis Date   Angina pectoris (HCC)    Coronary artery disease    Diabetes (HCC)    HTN (hypertension)    Hypercholesteremia    Hyperlipidemia    Obesity    Pain in joint    Pneumonia    Right thyroid nodule     PAST SURGICAL HISTORY: Past Surgical History:  Procedure Laterality Date   ANTERIOR  CERVICAL DECOMP/DISCECTOMY FUSION N/A 05/05/2021   Procedure: ANTERIOR CERVICAL DECOMPRESSION/DISCECTOMY FUSION 3 LEVELS C4-7;  Surgeon: Melina Schools, MD;  Location: [place];  Service: Orthopedics;  Laterality: N/A;  3.5 hrs   APPENDECTOMY     CARDIAC CATHETERIZATION     CORONARY ANGIOPLASTY     DES LAD 10/28/08 (Dr. Irish Lack, [facility])   [place] Left 07/09/2018   Dr. Caralyn Guile   FOOT SURGERY     HAND SURGERY Left 07/09/2018   Dr. Caralyn Guile    SHOULDER ARTHROSCOPY Left 01/19/2022   Procedure: Left shoulder arthroscopy, manipulation under anesthesia, evaluation under anesthesia, debridement;  Surgeon: Susa Day, MD;  [facility];  Service: Orthopedics;  Laterality: Left;  Regional block   SHOULDER ARTHROSCOPY WITH ROTATOR CUFF REPAIR AND SUBACROMIAL DECOMPRESSION Left 04/16/2020   Procedure: SHOULDER ARTHROSCOPY WITH MINI OPEN ROTATOR CUFF REPAIR AND SUBACROMIAL DECOMPRESSION;  Surgeon: Susa Day, MD;  [facility];  Service: Orthopedics;  Laterality: Left;  90 MINS   SHOULDER SURGERY Right 03/05/2018   Dr. Veverly Fells.    THYROID LOBECTOMY Right 11/19/2018   THYROID LOBECTOMY Right 11/19/2018   Procedure: RIGHT THYROID LOBECTOMY;  Surgeon: Ralene Ok, MD;  Location: [place];  Service: General;  Laterality: Right;    FAMILY HISTORY: Family History  Problem Relation Age of Onset   Atrial fibrillation Father    Coronary artery disease Maternal Grandfather    Diabetes Paternal Grandmother    CVA Paternal Grandfather    Stroke Paternal Grandfather     SOCIAL HISTORY: Social History   Socioeconomic History   Marital status: Married    Spouse name: Not on file   Number of children: Not on file   Years of education: Not on file   Highest education level: Not on file  Occupational History   Not on file  Tobacco Use   Smoking status: Never   Smokeless tobacco: Never  Vaping Use   Vaping Use: Never used  Substance and Sexual Activity   Alcohol use: Yes    Comment:  rare   Drug use: Never   Sexual activity: Not on file  Other Topics Concern   Not on file  Social History Narrative   Not on file   Social Determinants of Health   Financial Resource Strain: Not on file  Food Insecurity: Not on file  Transportation Needs: Not on file  Physical Activity: Not on file  Stress: Not on file  Social Connections: Not on file  Intimate Partner Violence: Not on file    PHYSICAL EXAM  GENERAL EXAM/CONSTITUTIONAL: Vitals:  Vitals:   07/20/22 0807  BP: 138/78  Pulse: 77  Weight: 239 lb (108.4 kg)  Height: '5\' 10"'$  (  1.778 m)   Body mass index is 34.29 kg/m. Wt Readings from Last 3 Encounters:  07/20/22 239 lb (108.4 kg)  01/19/22 243 lb (110.2 kg)  01/10/22 243 lb (110.2 kg)   Patient is in no distress; well developed, nourished and groomed; neck is supple  EYES: Pupils round and reactive to light, Visual fields full to confrontation, Extraocular movements intacts,   MUSCULOSKELETAL: Gait, strength, tone, movements noted in Neurologic exam below  NEUROLOGIC: MENTAL STATUS:      No data to display         awake, alert, oriented to person, place and time recent and remote memory intact normal attention and concentration language fluent, comprehension intact, naming intact fund of knowledge appropriate  CRANIAL NERVE:  2nd, 3rd, 4th, 6th - pupils equal and reactive to light, visual fields full to confrontation, extraocular muscles intact, no nystagmus 5th - facial sensation symmetric 7th - facial strength symmetric 8th - hearing intact 9th - palate elevates symmetrically, uvula midline 11th - shoulder shrug symmetric 12th - tongue protrusion midline  MOTOR:  normal bulk and tone, full strength in the BUE, BLE  SENSORY:  normal and symmetric to light touch  COORDINATION:  finger-nose-finger, fine finger movements normal  REFLEXES:  deep tendon reflexes present and symmetric  GAIT/STATION:  Normal but unable to tandem and  positive Romberg      DIAGNOSTIC DATA (LABS, IMAGING, TESTING) - I reviewed patient records, labs, notes, testing and imaging myself where available.  Lab Results  Component Value Date   WBC 10.7 (H) 01/10/2022   HGB 15.0 01/10/2022   HCT 43.7 01/10/2022   MCV 90.7 01/10/2022   PLT 232 01/10/2022      Component Value Date/Time   NA 139 01/10/2022 1145   K 4.2 01/10/2022 1145   CL 105 01/10/2022 1145   CO2 24 01/10/2022 1145   GLUCOSE 175 (H) 01/10/2022 1145   BUN 25 (H) 01/10/2022 1145   CREATININE 1.04 01/10/2022 1145   CALCIUM 8.7 (L) 01/10/2022 1145   PROT 6.6 04/16/2021 0724   ALBUMIN 4.8 04/16/2021 0724   AST 13 04/16/2021 0724   ALT 16 04/16/2021 0724   ALKPHOS 111 04/16/2021 0724   BILITOT 0.3 04/16/2021 0724   GFRNONAA >60 01/10/2022 1145   GFRAA >60 04/17/2020 0256   Lab Results  Component Value Date   CHOL 86 (L) 04/16/2021   HDL 24 (L) 04/16/2021   LDLCALC 41 04/16/2021   TRIG 114 04/16/2021   CHOLHDL 3.6 04/16/2021   Lab Results  Component Value Date   HGBA1C 7.7 (H) 01/10/2022   No results found for: "VITAMINB12" No results found for: "TSH"  MRI Brain 07/14/22 1.  Small acute infarct in the left lateral medulla.  2.  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  3.  Otherwise benign for age.   CTA Head and Neck 07/14/22 1.  Some wall thickening and plaque in the cervical carotids without stenosis. Heavy atherosclerosis of carotid siphons with possible mild stenosis.  2.  Focal area of narrowing in the inferior division of left MCA and also focal area of narrowing in the left P1 P2 junction.  3.  Otherwise benign.  Echocardiogram 07/15/22 Left Ventricle: Systolic function is normal. EF: 60-65%. Quantitative analysis of left ventricular Global Longitudinal Strain (GLS) imaging is -16.500%. Ejection fraction measured by 3D is 62%, which is normal.   ASSESSMENT AND PLAN  61 y.o. year old male with vascular risk factors including  hypertension, hyperlipidemia,  diabetes mellitus, and CAD who is presenting after being found to have a left lateral medullary stroke.  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  Patient still have mild dizziness and double vision at time but no falls.  On exam he does not have any facial weakness, motor strength is normal.  He is unable to tandem gait and has positive Romberg.  He is on DAPT, aspirin and Plavix, will continue those medications.  He is on Crestor 20 mg we will increase it to 40 mg. His echocardiogram showed a EF of 65% but we will send him for a cardiac event monitor.  I will contact him to go over the result and if there is any finding of atrial fibrillation, we will likely switch to anticoagulation.  Continue to follow with PCP and return in 1 year or sooner if worse.   1. Cerebrovascular accident (CVA), unspecified mechanism ([place])      Patient Instructions  Continue on aspirin and Plavix 30-day cardiac event monitor Increase Crestor to 40 mg daily Continue to follow with PCP Return in 1 year or sooner if worse.  Orders Placed This Encounter  Procedures   CARDIAC EVENT MONITOR    Meds ordered this encounter  Medications   rosuvastatin (CRESTOR) 40 MG tablet    Sig: Take 1 tablet (40 mg total) by mouth daily.    Dispense:  90 tablet    Refill:  3    Return in about 1 year (around 07/21/2023).    Alric Ran, MD 07/20/2022, 8:44 AM  [facility] [address] [place], [place] [address] [phone]

## 2022-07-20 NOTE — Patient Instructions (Signed)
Continue on aspirin and Plavix 30-day cardiac event monitor Increase Crestor to 40 mg daily Continue to follow with PCP Return in 1 year or sooner if worse.

## 2022-08-03 ENCOUNTER — Telehealth: Payer: Self-pay | Admitting: Neurology

## 2022-08-03 DIAGNOSIS — I639 Cerebral infarction, unspecified: Secondary | ICD-10-CM

## 2022-08-03 NOTE — Addendum Note (Signed)
Addended by: Florian Buff C on: 08/03/2022 10:18 AM   Modules accepted: Orders

## 2022-08-03 NOTE — Telephone Encounter (Signed)
Order placed incorrectly for cardiac event monitor, please resend for [facility]

## 2022-08-03 NOTE — Telephone Encounter (Signed)
New order placed

## 2022-08-04 ENCOUNTER — Other Ambulatory Visit: Payer: Self-pay | Admitting: Neurology

## 2022-08-04 DIAGNOSIS — R42 Dizziness and giddiness: Secondary | ICD-10-CM

## 2022-08-04 DIAGNOSIS — I639 Cerebral infarction, unspecified: Secondary | ICD-10-CM

## 2022-08-04 DIAGNOSIS — I4891 Unspecified atrial fibrillation: Secondary | ICD-10-CM

## 2022-09-21 ENCOUNTER — Telehealth: Payer: Self-pay | Admitting: *Deleted

## 2022-09-21 NOTE — Telephone Encounter (Signed)
   Pre-operative Risk Assessment    Patient Name: Eddie Garner  DOB: 10-21-1961 MRN: [id number]      Request for Surgical Clearance    Procedure:   UMBILICAL HERNIA REPAIR  Date of Surgery:  Clearance TBD                                 Surgeon:  Ermalinda Memos, MD Surgeon's Group or Practice Name:  [facility] Phone number:  [phone] Fax number:  [phone]   Type of Clearance Requested:   - Medical    Type of Anesthesia:  Not Indicated   Additional requests/questions:    [name]   09/21/2022, 12:30 PM

## 2022-09-23 NOTE — Telephone Encounter (Signed)
   Name: Eddie Garner  DOB: 1961-03-02  MRN: [id number]  Primary Cardiologist: Larae Grooms, MD   Preoperative team, please contact this patient and set up a phone call appointment for further preoperative risk assessment. Please obtain consent and complete medication review. Thank you for your help.  I confirm that guidance regarding antiplatelet and oral anticoagulation therapy has been completed and, if necessary, noted below.  Pre-op clearance from does not specifically mention need to hold Plavix. However, he has previously been given the OK to hold Plavix for 5 days prior to previous procedures. It does not look like he has had any new cardiac events since the last time this recommendation was given (05/2022 prior to endoscopy/ colonoscopy) so same recommendation should be able to be used.  Will remove from pre-op pool.   Darreld Mclean, PA-C 09/23/2022, 12:09 PM [place]

## 2022-09-27 ENCOUNTER — Telehealth: Payer: Self-pay | Admitting: *Deleted

## 2022-09-27 NOTE — Telephone Encounter (Signed)
Pt agreeable to tele pre op appt 09/28/22 @ 3 pm. Med rec and consent are done.

## 2022-09-27 NOTE — Telephone Encounter (Signed)
Pt agreeable to tele pre op appt 09/28/22 @ 3 pm. Med rec and consent are done.     Patient Consent for Virtual Visit        Eddie Garner has provided verbal consent on 09/27/2022 for a virtual visit (video or telephone).   CONSENT FOR VIRTUAL VISIT FOR:  Eddie Garner  By participating in this virtual visit I agree to the following:  I hereby voluntarily request, consent and authorize [facility] and its employed or contracted physicians, physician assistants, nurse practitioners or other licensed health care professionals (the Practitioner), to provide me with telemedicine health care services (the "Services") as deemed necessary by the treating Practitioner. I acknowledge and consent to receive the Services by the Practitioner via telemedicine. I understand that the telemedicine visit will involve communicating with the Practitioner through live audiovisual communication technology and the disclosure of certain medical information by electronic transmission. I acknowledge that I have been given the opportunity to request an in-person assessment or other available alternative prior to the telemedicine visit and am voluntarily participating in the telemedicine visit.  I understand that I have the right to withhold or withdraw my consent to the use of telemedicine in the course of my care at any time, without affecting my right to future care or treatment, and that the Practitioner or I may terminate the telemedicine visit at any time. I understand that I have the right to inspect all information obtained and/or recorded in the course of the telemedicine visit and may receive copies of available information for a reasonable fee.  I understand that some of the potential risks of receiving the Services via telemedicine include:  Delay or interruption in medical evaluation due to technological equipment failure or disruption; Information transmitted may not be sufficient (e.g. poor  resolution of images) to allow for appropriate medical decision making by the Practitioner; and/or  In rare instances, security protocols could fail, causing a breach of personal health information.  Furthermore, I acknowledge that it is my responsibility to provide information about my medical history, conditions and care that is complete and accurate to the best of my ability. I acknowledge that Practitioner's advice, recommendations, and/or decision may be based on factors not within their control, such as incomplete or inaccurate data provided by me or distortions of diagnostic images or specimens that may result from electronic transmissions. I understand that the practice of medicine is not an exact science and that Practitioner makes no warranties or guarantees regarding treatment outcomes. I acknowledge that a copy of this consent can be made available to me via my patient portal ([facility]), or I can request a printed copy by calling the office of [facility].    I understand that my insurance will be billed for this visit.   I have read or had this consent read to me. I understand the contents of this consent, which adequately explains the benefits and risks of the Services being provided via telemedicine.  I have been provided ample opportunity to ask questions regarding this consent and the Services and have had my questions answered to my satisfaction. I give my informed consent for the services to be provided through the use of telemedicine in my medical care

## 2022-09-28 ENCOUNTER — Ambulatory Visit: Payer: 59 | Attending: Cardiology | Admitting: General Practice

## 2022-09-28 DIAGNOSIS — Z01810 Encounter for preprocedural cardiovascular examination: Secondary | ICD-10-CM

## 2022-09-28 NOTE — Progress Notes (Signed)
CLEARANCE NOTES HAVE BEEN FAXED TO REQUESTING OFFICE  

## 2022-09-28 NOTE — Progress Notes (Signed)
Virtual Visit via Telephone Note   Because of Eddie Garner's co-morbid illnesses, he is at least at moderate risk for complications without adequate follow up.  This format is felt to be most appropriate for this patient at this time.  The patient did not have access to video technology/had technical difficulties with video requiring transitioning to audio format only (telephone).  All issues noted in this document were discussed and addressed.  No physical exam could be performed with this format.  Please refer to the patient's chart for his consent to telehealth for [facility].  Evaluation Performed:  Preoperative cardiovascular risk assessment _____________   Date:  09/28/2022   Patient ID:  Eddie Garner, DOB December 20, 1960, MRN [id number] Patient Location:  Home Provider location:   Office  Primary Care Provider:  Medicine, [facility] Primary Cardiologist:  Larae Grooms, MD  Chief Complaint / Patient Profile   61 y.o. y/o male with a h/o CAD, HLD, HTN who is pending umbilical hernia repair and presents today for telephonic preoperative cardiovascular risk assessment.  Past Medical History    Past Medical History:  Diagnosis Date   Angina pectoris (HCC)    Coronary artery disease    Diabetes (HCC)    HTN (hypertension)    Hypercholesteremia    Hyperlipidemia    Obesity    Pain in joint    Pneumonia    Right thyroid nodule    Past Surgical History:  Procedure Laterality Date   ANTERIOR CERVICAL DECOMP/DISCECTOMY FUSION N/A 05/05/2021   Procedure: ANTERIOR CERVICAL DECOMPRESSION/DISCECTOMY FUSION 3 LEVELS C4-7;  Surgeon: Melina Schools, MD;  Location: [place];  Service: Orthopedics;  Laterality: N/A;  3.5 hrs   APPENDECTOMY     CARDIAC CATHETERIZATION     CORONARY ANGIOPLASTY     DES LAD 10/28/08 (Dr. Irish Lack, [facility])   [place] Left 07/09/2018   Dr. Caralyn Guile   FOOT SURGERY     HAND SURGERY Left 07/09/2018   Dr. Caralyn Guile     SHOULDER ARTHROSCOPY Left 01/19/2022   Procedure: Left shoulder arthroscopy, manipulation under anesthesia, evaluation under anesthesia, debridement;  Surgeon: Susa Day, MD;  Location: [facility];  Service: Orthopedics;  Laterality: Left;  Regional block   SHOULDER ARTHROSCOPY WITH ROTATOR CUFF REPAIR AND SUBACROMIAL DECOMPRESSION Left 04/16/2020   Procedure: SHOULDER ARTHROSCOPY WITH MINI OPEN ROTATOR CUFF REPAIR AND SUBACROMIAL DECOMPRESSION;  Surgeon: Susa Day, MD;  Location[facility];  Service: Orthopedics;  Laterality: Left;  90 MINS   SHOULDER SURGERY Right 03/05/2018   Dr. Veverly Fells.    THYROID LOBECTOMY Right 11/19/2018   THYROID LOBECTOMY Right 11/19/2018   Procedure: RIGHT THYROID LOBECTOMY;  Surgeon: Ralene Ok, MD;  Location: [place];  Service: General;  Laterality: Right;    Allergies  No Known Allergies  History of Present Illness    Eddie Garner is a 61 y.o. male who presents via audio/video conferencing for a telehealth visit today.  Pt was last seen in cardiology clinic on 12/15/2021 by Ermalinda Barrios, PA-C.  At that time Eddie Garner was doing well .  The patient is now pending procedure as outlined above. Since his last visit, he remains stable from a cardiac standpoint  Today he denies chest pain, shortness of breath, lower extremity edema, fatigue, palpitations, melena, hematuria, hemoptysis, diaphoresis, weakness, presyncope, syncope, orthopnea, and PND.   Home Medications    Prior to Admission medications   Medication Sig Start Date End Date Taking? Authorizing Provider  aspirin  EC 81 MG tablet Take 1 tablet (81 mg total) by mouth daily. Day after surgery 01/20/22   Susa Day, MD  clopidogrel (PLAVIX) 75 MG tablet TAKE 1 TABLET BY MOUTH EVERY DAY WITH BREAKFAST 04/15/22   Jettie Booze, MD  dapagliflozin propanediol (FARXIGA) 10 MG TABS tablet Take 10 mg by mouth daily with lunch.     [provider]  glucose blood test strip 1 each by  Other route as needed for other. Use as instructed    [provider]  lisinopril (ZESTRIL) 10 MG tablet Take 1 tablet (10 mg total) by mouth daily. 01/14/21   Jettie Booze, MD  metFORMIN (GLUCOPHAGE-XR) 500 MG 24 hr tablet Take 1,000 mg by mouth 2 (two) times daily.    [provider]  nitroGLYCERIN (NITROSTAT) 0.4 MG SL tablet Place 0.4 mg under the tongue every 5 (five) minutes as needed for chest pain.     [provider]  Omega-3 Fatty Acids (FISH OIL) 1000 MG CAPS Take 1,000 mg by mouth daily.    [provider]  rosuvastatin (CRESTOR) 40 MG tablet Take 1 tablet (40 mg total) by mouth daily. 07/20/22 07/15/23  Alric Ran, MD  Semaglutide,0.25 or 0.'5MG'$ /DOS, (OZEMPIC, 0.25 OR 0.5 MG/DOSE,) 2 MG/3ML SOPN Inject 0.25 mg as directed once a week. 06/28/22   [provider]  TRESIBA FLEXTOUCH 100 UNIT/ML FlexTouch Pen Inject 50 Units into the skin daily. Patient not taking: Reported on 09/27/2022 03/28/21   [provider]    Physical Exam    Vital Signs:  Eddie Garner does not have vital signs available for review today.  Given telephonic nature of communication, physical exam is limited. AAOx3. NAD. Normal affect.  Speech and respirations are unlabored.  Accessory Clinical Findings    None  Assessment & Plan    1.  Preoperative Cardiovascular Risk Assessment: Umbilical hernia repair, [facility], Dr.Reyes     Primary Cardiologist: Larae Grooms, MD  Chart reviewed as part of pre-operative protocol coverage. Given past medical history and time since last visit, based on ACC/AHA guidelines, Eddie Garner would be at acceptable risk for the planned procedure without further cardiovascular testing.   Patient was advised that if he develops new symptoms prior to surgery to contact our office to arrange a follow-up appointment.  He verbalized understanding.  His Plavix may be held for 5 days prior to his  procedure.  Please resume as soon as hemostasis is achieved.  I will route this recommendation to the requesting party via Epic fax function and remove from pre-op pool.     Time:   Today, I have spent 13 minutes with the patient with telehealth technology discussing medical history, symptoms, and management plan.  Prior to his phone evaluation I spent greater than 10 minutes reviewing his past medical history and cardiac medications.   Deberah Pelton, NP  09/28/2022, 8:09 AM

## 2022-12-16 ENCOUNTER — Encounter: Payer: Self-pay | Admitting: Interventional Cardiology

## 2022-12-16 ENCOUNTER — Ambulatory Visit: Payer: 59 | Attending: Interventional Cardiology | Admitting: Interventional Cardiology

## 2022-12-16 ENCOUNTER — Ambulatory Visit (INDEPENDENT_AMBULATORY_CARE_PROVIDER_SITE_OTHER): Payer: 59

## 2022-12-16 VITALS — BP 130/86 | HR 74 | Ht 70.5 in | Wt 236.0 lb

## 2022-12-16 DIAGNOSIS — I639 Cerebral infarction, unspecified: Secondary | ICD-10-CM

## 2022-12-16 DIAGNOSIS — E782 Mixed hyperlipidemia: Secondary | ICD-10-CM | POA: Diagnosis not present

## 2022-12-16 DIAGNOSIS — I25118 Atherosclerotic heart disease of native coronary artery with other forms of angina pectoris: Secondary | ICD-10-CM

## 2022-12-16 DIAGNOSIS — I10 Essential (primary) hypertension: Secondary | ICD-10-CM

## 2022-12-16 MED ORDER — NITROGLYCERIN 0.4 MG SL SUBL: 0.4000 mg | SUBLINGUAL_TABLET | SUBLINGUAL | 6 refills | Status: AC | PRN

## 2022-12-16 NOTE — Progress Notes (Unsigned)
Enrolled patient for a 14 day Zio XT  monitor to be mailed to patients home  °

## 2022-12-16 NOTE — Progress Notes (Signed)
Cardiology Office Note   Date:  12/16/2022   ID:  Eddie Garner, DOB [birth year], MRN [id number]  PCP:  Med[facility], [place]    No chief complaint on file.  CAD  Wt Readings from Last 3 Encounters:  12/16/22 236 lb (107 kg)  07/20/22 239 lb (108.4 kg)  01/19/22 243 lb (110.2 kg)       History of Present Illness: Eddie Garner is a 62 y.o. male  Who has had an LAD drug eluting stent in 2009.       ETT in 2014 was negative. 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    Working on getting HbA1C down, was at 9, in the past.  In 11/18, it was 8.3.   He has had shoulder, elbow and thyroid surgery.  No cardiac issues.  He was out of work.  He gained some weight back after that.     A1C in 2/20 was 8.8.   He had COVID in Jan 2022.  He quarantined at home.  Visual changes in 9/23, double vision and was sent to the ER, [facility].  Diagnosed with a stroke.  Saw a neurologist who added the baby aspirin back.  No blood in stool or blood in urine.  He has not had his monitor done post stroke.   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  2.  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  3.  Otherwise benign for age. "  Echocardiogram in September 2023 at [facility] showed normal LV/RV and valvular function.  Denies : Chest pain. Dizziness. Leg edema. Nitroglycerin use. Orthopnea. Palpitations. Paroxysmal nocturnal dyspnea. Shortness of breath. Syncope.       Past Medical History:  Diagnosis Date   Angina pectoris ([place])    Coronary artery disease    Diabetes (HCC)    HTN (hypertension)    Hypercholesteremia    Hyperlipidemia    Obesity    Pain in joint    Pneumonia    Right thyroid nodule     Past Surgical History:  Procedure Laterality Date   ANTERIOR CERVICAL DECOMP/DISCECTOMY FUSION N/A 05/05/2021   Procedure: ANTERIOR CERVICAL DECOMPRESSION/DISCECTOMY FUSION 3 LEVELS  C4-7;  Surgeon: Melina Schools, MD;  Location: [place];  Service: Orthopedics;  Laterality: N/A;  3.5 hrs   APPENDECTOMY     CARDIAC CATHETERIZATION     CORONARY ANGIOPLASTY     DES LAD 10/28/08 (Dr. Irish Lack, [facility])   [place] Left 07/09/2018   Dr. Caralyn Guile   FOOT SURGERY     HAND SURGERY Left 07/09/2018   Dr. Caralyn Guile    SHOULDER ARTHROSCOPY Left 01/19/2022   Procedure: Left shoulder arthroscopy, manipulation under anesthesia, evaluation under anesthesia, debridement;  Surgeon: Susa Day, MD;  [facility];  Service: Orthopedics;  Laterality: Left;  Regional block   SHOULDER ARTHROSCOPY WITH ROTATOR CUFF REPAIR AND SUBACROMIAL DECOMPRESSION Left 04/16/2020   Procedure: SHOULDER ARTHROSCOPY WITH MINI OPEN ROTATOR CUFF REPAIR AND SUBACROMIAL DECOMPRESSION;  Surgeon: Susa Day, MD;  [facility];  Service: Orthopedics;  Laterality: Left;  90 MINS   SHOULDER SURGERY Right 03/05/2018   Dr. Veverly Fells.    THYROID LOBECTOMY Right 11/19/2018   THYROID LOBECTOMY Right 11/19/2018   Procedure: RIGHT THYROID LOBECTOMY;  Surgeon: Ralene Ok, MD;  Location: [place];  Service: General;  Laterality: Right;     Current Outpatient Medications  Medication Sig Dispense Refill  aspirin EC 81 MG tablet Take 1 tablet (81 mg total) by mouth daily. Day after surgery 30 tablet 1   clopidogrel (PLAVIX) 75 MG tablet TAKE 1 TABLET BY MOUTH EVERY DAY WITH BREAKFAST 90 tablet 2   dapagliflozin propanediol (FARXIGA) 10 MG TABS tablet Take 10 mg by mouth daily with lunch.      glucose blood test strip 1 each by Other route as needed for other. Use as instructed     lisinopril (ZESTRIL) 10 MG tablet Take 1 tablet (10 mg total) by mouth daily. 90 tablet 3   metFORMIN (GLUCOPHAGE-XR) 500 MG 24 hr tablet Take 1,000 mg by mouth 2 (two) times daily.     nitroGLYCERIN (NITROSTAT) 0.4 MG SL tablet Place 0.4 mg under the tongue every 5 (five) minutes as needed for chest pain.      Omega-3 Fatty Acids (FISH  OIL) 1000 MG CAPS Take 1,000 mg by mouth daily.     rosuvastatin (CRESTOR) 40 MG tablet Take 1 tablet (40 mg total) by mouth daily. 90 tablet 3   Semaglutide,0.25 or 0.5MG/DOS, (OZEMPIC, 0.25 OR 0.5 MG/DOSE,) 2 MG/3ML SOPN Inject 0.25 mg as directed once a week.     TRESIBA FLEXTOUCH 100 UNIT/ML FlexTouch Pen Inject 50 Units into the skin daily.     No current facility-administered medications for this visit.    Allergies:   Patient has no known allergies.    Social History:  The patient  reports that he has never smoked. He has never used smokeless tobacco. He reports current alcohol use. He reports that he does not use drugs.   Family History:  The patient's family history includes Atrial fibrillation in his father; CVA in his paternal grandfather; Coronary artery disease in his maternal grandfather; Diabetes in his paternal grandmother; Stroke in his paternal grandfather.    ROS:  Please see the history of present illness.   Otherwise, review of systems are positive for  .   All other systems are reviewed and negative.    PHYSICAL EXAM: VS:  BP 130/86   Pulse 74   Ht 5' 10.5" (1.791 m)   Wt 236 lb (107 kg)   SpO2 95%   BMI 33.38 kg/m  , BMI Body mass index is 33.38 kg/m. GEN: Well nourished, well developed, in no acute distress HEENT: normal Neck: no JVD, carotid bruits, or masses Cardiac: RRR; no murmurs, rubs, or gallops,no edema  Respiratory:  clear to auscultation bilaterally, normal work of breathing GI: soft, nontender, nondistended, + BS MS: no deformity or atrophy Skin: warm and dry, no rash Neuro:  Strength and sensation are intact Psych: euthymic mood, full affect   EKG:   The ekg ordered today demonstrates NSR, inferior T wave inversions   Recent Labs: 01/10/2022: BUN 25; Creatinine, Ser 1.04; Hemoglobin 15.0; Platelets 232; Potassium 4.2; Sodium 139   Lipid Panel    Component Value Date/Time   CHOL 86 (L) 04/16/2021 0724   TRIG 114 04/16/2021 0724    HDL 24 (L) 04/16/2021 0724   CHOLHDL 3.6 04/16/2021 0724   LDLCALC 41 04/16/2021 0724     Other studies Reviewed: Additional studies/ records that were reviewed today with results demonstrating: LDL 28 in 9/23.   ASSESSMENT AND PLAN:  CAD: No angina.  Continue aggressive secondary prevention.  Back on aspirin and Plavix at this time. Diabetes: A1C March 2023 was 7.7.  Down to 7.1 in December 2023.  Continue healthy lifestyle. Hyperlipidemia: Whole food, plant-based diet recommended.  High-fiber diet.  Lipids well-controlled. Erectile dysfunction: Use sildenafil in the past. Hypertension: The current medical regimen is effective;  continue present plan and medications. Carotid atherosclerosis: Plan for carotid Doppler given CVA and abnormal CTA of the carotid arteries.  Plan for a 2 weeks Zio patch to rule out atrial fibrillation.   Current medicines are reviewed at length with the patient today.  The patient concerns regarding his medicines were addressed.  The following changes have been made:  No change  Labs/ tests ordered today include:  No orders of the defined types were placed in this encounter.   Recommend 150 minutes/week of aerobic exercise Low fat, low carb, high fiber diet recommended  Disposition:   FU in 1 year   Signed, Larae Grooms, MD  12/16/2022 9:41 AM    [facility] [address], [place], [place]  [address] Phone: [phone]; Fax: [phone]

## 2022-12-16 NOTE — Patient Instructions (Signed)
Medication Instructions:  Your physician recommends that you continue on your current medications as directed. Please refer to the Current Medication list given to you today.  *If you need a refill on your cardiac medications before your next appointment, please call your pharmacy*   Lab Work: none If you have labs (blood work) drawn today and your tests are completely normal, you will receive your results only by: [place] (if you have MyChart) OR A paper copy in the mail If you have any lab test that is abnormal or we need to change your treatment, we will call you to review the results.   Testing/Procedures: Your physician has requested that you have a carotid duplex. This test is an ultrasound of the carotid arteries in your neck. It looks at blood flow through these arteries that supply the brain with blood. Allow one hour for this exam. There are no restrictions or special instructions. To be done in March  Dr Irish Lack recommends you wear a 2 week zio patch   Follow-Up: At [facility], you and your health needs are our priority.  As part of our continuing mission to provide you with exceptional heart care, we have created designated Provider Care Teams.  These Care Teams include your primary Cardiologist (physician) and Advanced Practice Providers (APPs -  Physician Assistants and Nurse Practitioners) who all work together to provide you with the care you need, when you need it.  We recommend signing up for the patient portal called "MyChart".  Sign up information is provided on this After Visit Summary.  MyChart is used to connect with patients for Virtual Visits (Telemedicine).  Patients are able to view lab/test results, encounter notes, upcoming appointments, etc.  Non-urgent messages can be sent to your provider as well.   To learn more about what you can do with MyChart, go to [email or web address].    Your next appointment:   12 month(s)  Provider:    Larae Grooms, MD     Other Instructions [place] [place]- Long Term Monitor Instructions  Your physician has requested you wear a ZIO patch monitor for 14 days.  This is a single patch monitor. [employer] supplies one patch monitor per enrollment. Additional stickers are not available. Please do not apply patch if you will be having a Nuclear Stress Test,  Echocardiogram, Cardiac CT, MRI, or Chest Xray during the period you would be wearing the  monitor. The patch cannot be worn during these tests. You cannot remove and re-apply the  ZIO XT patch monitor.  Your ZIO patch monitor will be mailed 3 day USPS to your address on file. It may take 3-5 days  to receive your monitor after you have been enrolled.  Once you have received your monitor, please review the enclosed instructions. Your monitor  has already been registered assigning a specific monitor serial # to you.  Billing and Patient Assistance Program Information  We have supplied [employer] with any of your insurance information on file for billing purposes. [employer] offers a sliding scale Patient Assistance Program for patients that do not have  insurance, or whose insurance does not completely cover the cost of the ZIO monitor.  You must apply for the Patient Assistance Program to qualify for this discounted rate.  To apply, please call [employer] at [phone], select option 4, select option 2, ask to apply for  Patient Assistance Program. Theodore Demark will ask your household income, and how many people  are in your household. They will quote  your out-of-pocket cost based on that information.  [employer] will also be able to set up a 40-month interest-free payment plan if needed.  Applying the monitor   Shave hair from upper left chest.  Hold abrader disc by orange tab. Rub abrader in 40 strokes over the upper left chest as  indicated in your monitor instructions.  Clean area with 4 enclosed alcohol pads. Let dry.  Apply patch as indicated in  monitor instructions. Patch will be placed under collarbone on left  side of chest with arrow pointing upward.  Rub patch adhesive wings for 2 minutes. Remove white label marked "1". Remove the white  label marked "2". Rub patch adhesive wings for 2 additional minutes.  While looking in a mirror, press and release button in center of patch. A small green light will  flash 3-4 times. This will be your only indicator that the monitor has been turned on.  Do not shower for the first 24 hours. You may shower after the first 24 hours.  Press the button if you feel a symptom. You will hear a small click. Record Date, Time and  Symptom in the Patient Logbook.  When you are ready to remove the patch, follow instructions on the last 2 pages of Patient  Logbook. Stick patch monitor onto the last page of Patient Logbook.  Place Patient Logbook in the blue and white box. Use locking tab on box and tape box closed  securely. The blue and white box has prepaid postage on it. Please place it in the mailbox as  soon as possible. Your physician should have your test results approximately 7 days after the  monitor has [facility]  Call [email or web address] [phone] you have questions regarding  your ZIO XT patch monitor. Call them immediately if you see an orange light blinking on your  monitor.  If your monitor falls off in less than 4 days, contact our Monitor department at [phone]  If your monitor becomes loose or falls off after 4 days call [employer] at [phone]  suggestions on securing your monitor

## 2022-12-21 DIAGNOSIS — I25118 Atherosclerotic heart disease of native coronary artery with other forms of angina pectoris: Secondary | ICD-10-CM | POA: Diagnosis not present

## 2022-12-21 DIAGNOSIS — I10 Essential (primary) hypertension: Secondary | ICD-10-CM

## 2022-12-21 DIAGNOSIS — I639 Cerebral infarction, unspecified: Secondary | ICD-10-CM

## 2022-12-27 ENCOUNTER — Encounter (HOSPITAL_COMMUNITY): Payer: 59

## 2023-01-10 ENCOUNTER — Other Ambulatory Visit: Payer: Self-pay | Admitting: Interventional Cardiology

## 2023-01-17 ENCOUNTER — Ambulatory Visit (HOSPITAL_COMMUNITY)
Admission: RE | Admit: 2023-01-17 | Discharge: 2023-01-17 | Disposition: A | Discharge status: Home / Self Care | Payer: 59 | Source: Ambulatory Visit | Attending: Cardiovascular Disease | Admitting: Cardiovascular Disease

## 2023-01-17 DIAGNOSIS — I10 Essential (primary) hypertension: Secondary | ICD-10-CM

## 2023-01-17 DIAGNOSIS — E782 Mixed hyperlipidemia: Secondary | ICD-10-CM

## 2023-01-17 DIAGNOSIS — I639 Cerebral infarction, unspecified: Secondary | ICD-10-CM

## 2023-01-17 DIAGNOSIS — I25118 Atherosclerotic heart disease of native coronary artery with other forms of angina pectoris: Secondary | ICD-10-CM | POA: Diagnosis not present

## 2023-01-19 ENCOUNTER — Other Ambulatory Visit: Payer: Self-pay | Admitting: *Deleted

## 2023-01-19 DIAGNOSIS — I779 Disorder of arteries and arterioles, unspecified: Secondary | ICD-10-CM

## 2023-06-23 IMAGING — CR DG CHEST 2V
2 series · 2 of 2 positions shown · non-contrast
Comparison: November 12, 2018.

CLINICAL DATA: Preop for neck surgery.

EXAM:
CHEST - 2 VIEW

[w chest pa]
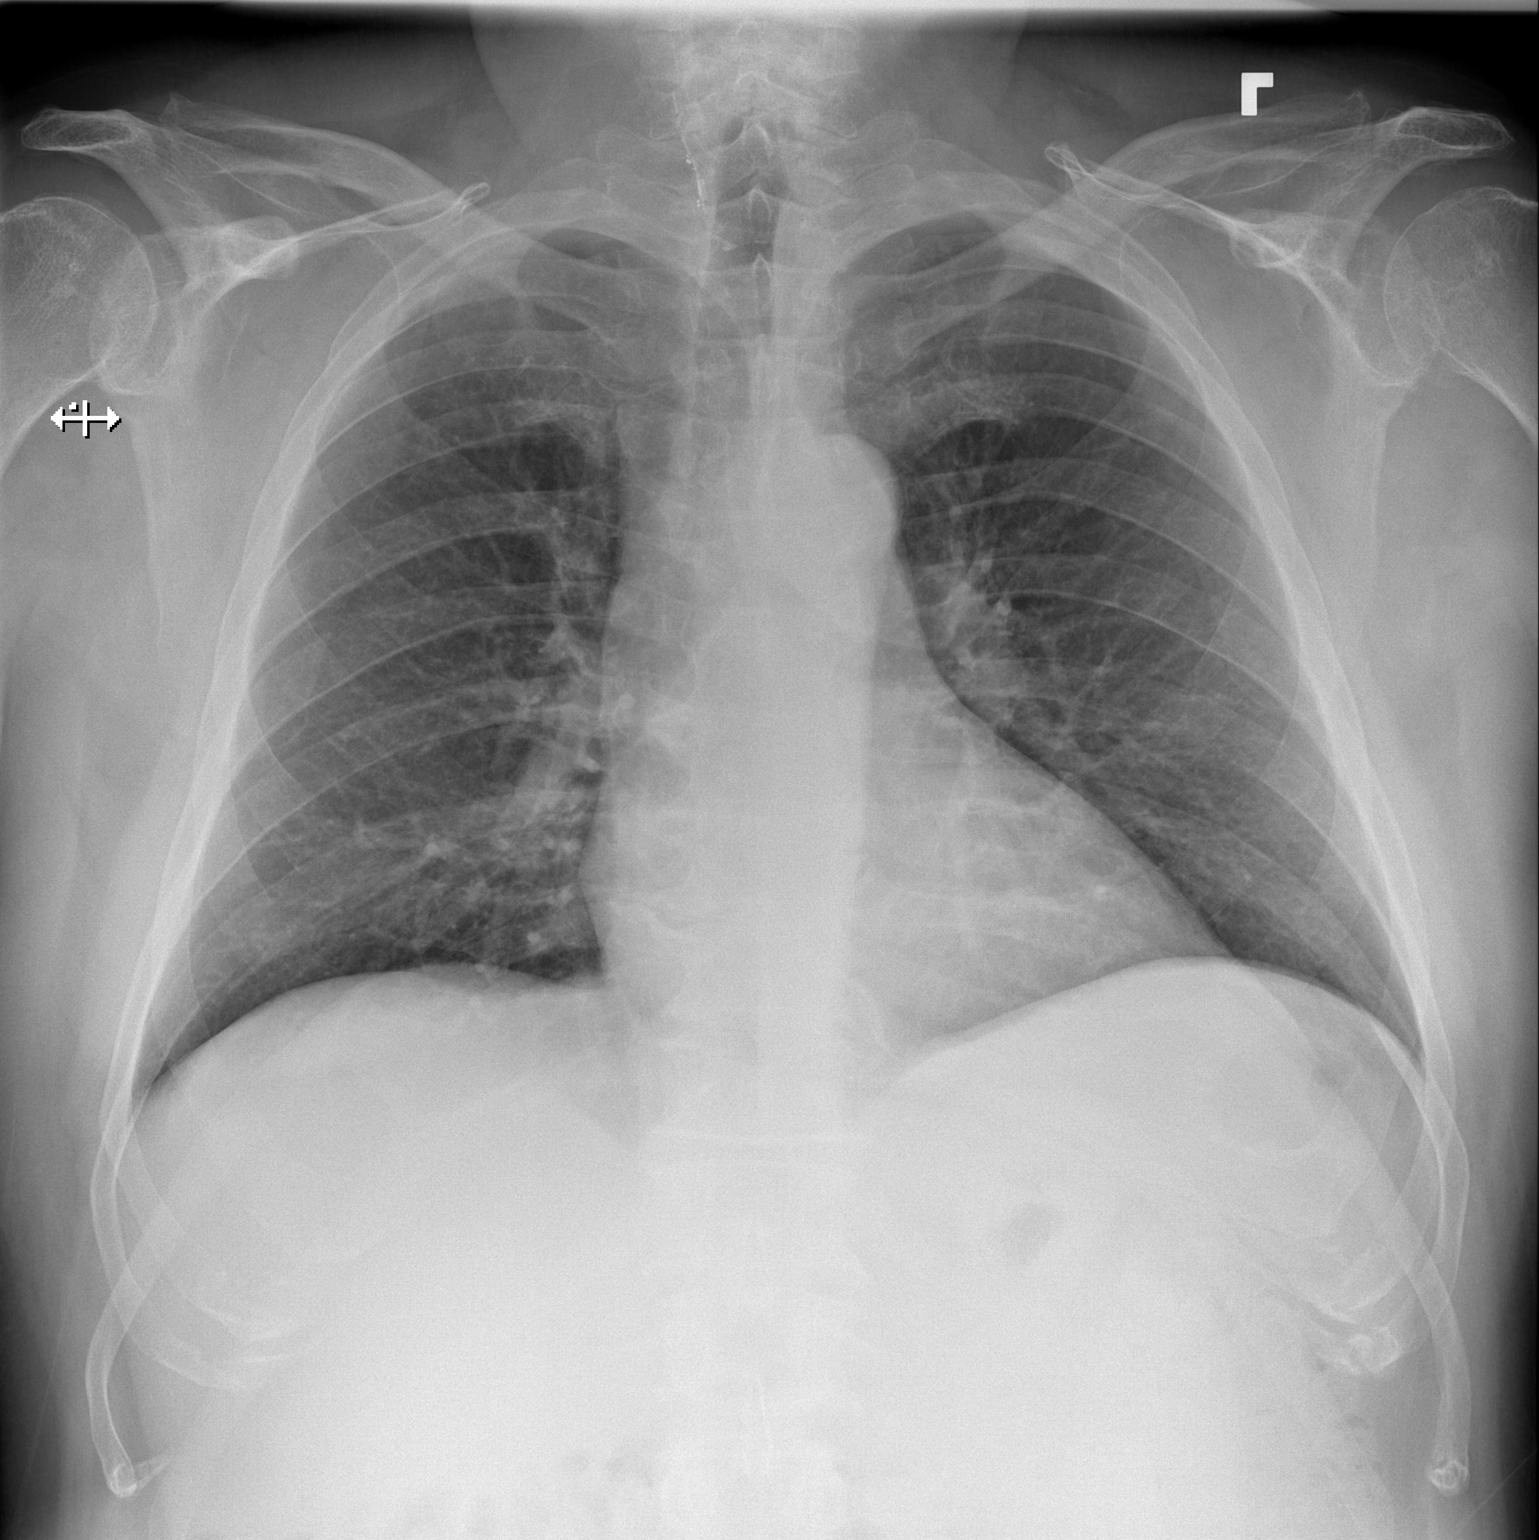

[w chest lat]
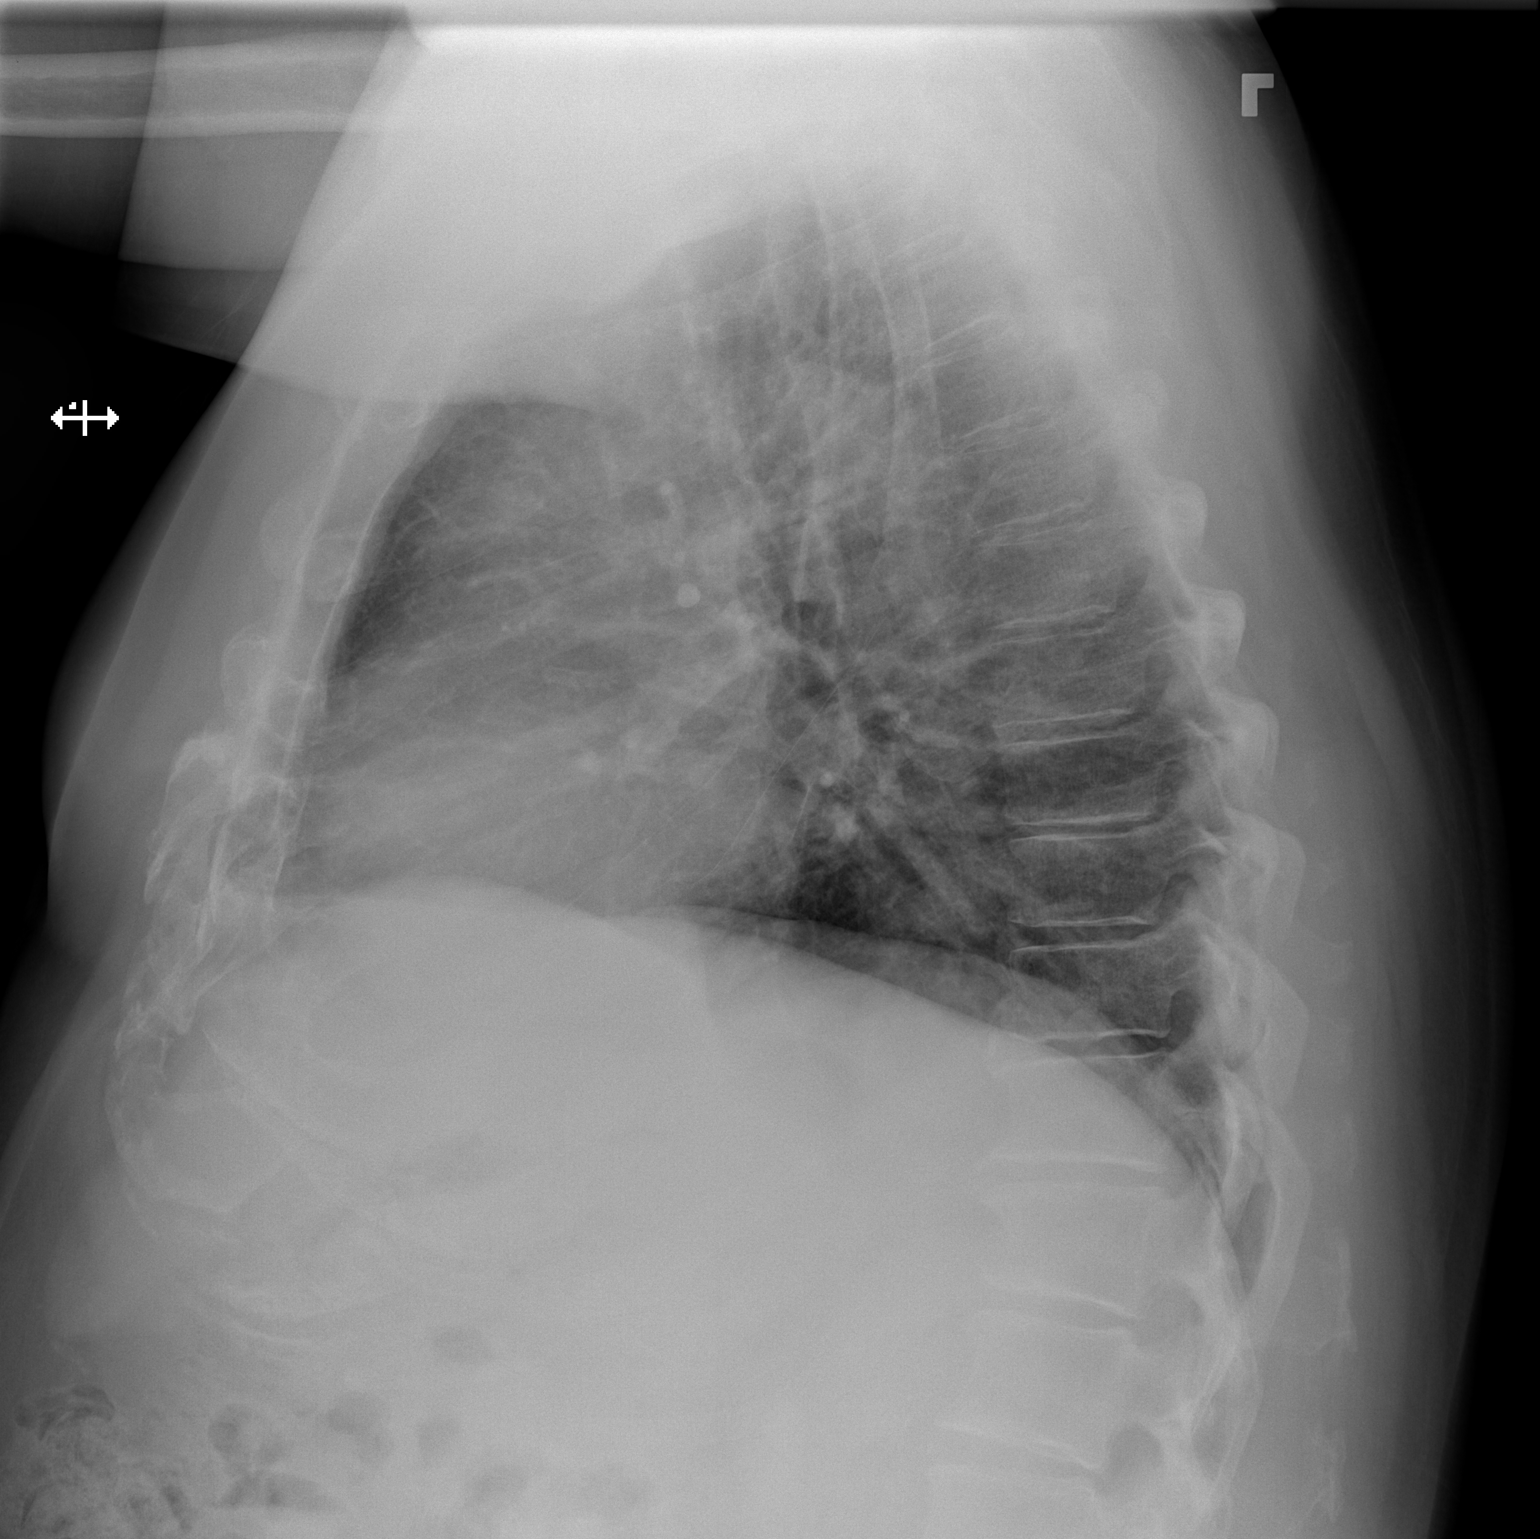

[2 of 2 positions shown; findings below may reference images not displayed]

FINDINGS: The heart size and mediastinal contours are within normal limits.
Both lungs are clear. The visualized skeletal structures are
unremarkable.
IMPRESSION: No active cardiopulmonary disease.

## 2023-07-20 ENCOUNTER — Ambulatory Visit: Payer: Self-pay | Admitting: Neurology

## 2023-07-31 ENCOUNTER — Other Ambulatory Visit: Payer: Self-pay | Admitting: Neurology

## 2023-08-15 ENCOUNTER — Other Ambulatory Visit: Payer: Self-pay

## 2023-08-25 ENCOUNTER — Encounter: Payer: Self-pay | Admitting: Family Medicine

## 2023-09-01 ENCOUNTER — Encounter: Payer: Self-pay | Admitting: Family Medicine

## 2023-09-01 ENCOUNTER — Ambulatory Visit (INDEPENDENT_AMBULATORY_CARE_PROVIDER_SITE_OTHER): Payer: 59 | Admitting: Family Medicine

## 2023-09-01 VITALS — BP 126/72 | HR 69 | Ht 71.0 in | Wt 241.6 lb

## 2023-09-01 DIAGNOSIS — E1129 Type 2 diabetes mellitus with other diabetic kidney complication: Secondary | ICD-10-CM

## 2023-09-01 DIAGNOSIS — Z125 Encounter for screening for malignant neoplasm of prostate: Secondary | ICD-10-CM | POA: Diagnosis not present

## 2023-09-01 DIAGNOSIS — I10 Essential (primary) hypertension: Secondary | ICD-10-CM

## 2023-09-01 DIAGNOSIS — Z23 Encounter for immunization: Secondary | ICD-10-CM | POA: Diagnosis not present

## 2023-09-01 DIAGNOSIS — E7800 Pure hypercholesterolemia, unspecified: Secondary | ICD-10-CM | POA: Diagnosis not present

## 2023-09-01 DIAGNOSIS — Z794 Long term (current) use of insulin: Secondary | ICD-10-CM

## 2023-09-01 DIAGNOSIS — M7741 Metatarsalgia, right foot: Secondary | ICD-10-CM | POA: Diagnosis not present

## 2023-09-01 DIAGNOSIS — Z0000 Encounter for general adult medical examination without abnormal findings: Secondary | ICD-10-CM | POA: Diagnosis not present

## 2023-09-01 DIAGNOSIS — R809 Proteinuria, unspecified: Secondary | ICD-10-CM | POA: Diagnosis not present

## 2023-09-01 LAB — CBC
HCT: 42 % (ref 39.0–52.0)
Hemoglobin: 13.4 g/dL (ref 13.0–17.0)
MCHC: 32 g/dL (ref 30.0–36.0)
MCV: 92.6 fL (ref 78.0–100.0)
Platelets: 252 10*3/uL (ref 150.0–400.0)
RBC: 4.53 Mil/uL (ref 4.22–5.81)
RDW: 13 % (ref 11.5–15.5)
WBC: 7.3 10*3/uL (ref 4.0–10.5)

## 2023-09-01 LAB — LIPID PANEL
Cholesterol: 75 mg/dL (ref 0–200)
HDL: 26.2 mg/dL — ABNORMAL LOW (ref >39.00)
LDL Cholesterol: 34 mg/dL (ref 0–99)
NonHDL: 49.04
Total CHOL/HDL Ratio: 3
Triglycerides: 74 mg/dL (ref 0.0–149.0)
VLDL: 14.8 mg/dL (ref 0.0–40.0)

## 2023-09-01 LAB — COMPREHENSIVE METABOLIC PANEL
ALT: 22 U/L (ref 0–53)
AST: 17 U/L (ref 0–37)
Albumin: 4.3 g/dL (ref 3.5–5.2)
Alkaline Phosphatase: 94 U/L (ref 39–117)
BUN: 22 mg/dL (ref 6–23)
CO2: 28 meq/L (ref 19–32)
Calcium: 9 mg/dL (ref 8.4–10.5)
Chloride: 106 meq/L (ref 96–112)
Creatinine, Ser: 1.05 mg/dL (ref 0.40–1.50)
GFR: 75.8 mL/min (ref >60.00)
Glucose, Bld: 286 mg/dL — ABNORMAL HIGH (ref 70–99)
Potassium: 4.7 meq/L (ref 3.5–5.1)
Sodium: 140 meq/L (ref 135–145)
Total Bilirubin: 0.7 mg/dL (ref 0.2–1.2)
Total Protein: 6.6 g/dL (ref 6.0–8.3)

## 2023-09-01 LAB — MICROALBUMIN / CREATININE URINE RATIO
Creatinine,U: 106.4 mg/dL
Microalb Creat Ratio: 2.9 mg/g (ref 0.0–30.0)
Microalb, Ur: 3.1 mg/dL — ABNORMAL HIGH (ref 0.0–1.9)

## 2023-09-01 LAB — HEMOGLOBIN A1C: Hgb A1c MFr Bld: 11.1 % — ABNORMAL HIGH (ref 4.6–6.5)

## 2023-09-01 LAB — PSA: PSA: 1.51 ng/mL (ref 0.10–4.00)

## 2023-09-01 MED ORDER — DAPAGLIFLOZIN PROPANEDIOL 10 MG PO TABS: 10.0000 mg | ORAL_TABLET | Freq: Every day | ORAL | 3 refills | Status: DC

## 2023-09-01 MED ORDER — TRESIBA FLEXTOUCH 100 UNIT/ML ~~LOC~~ SOPN: 50.0000 [IU] | PEN_INJECTOR | Freq: Every day | SUBCUTANEOUS | 1 refills | Status: DC

## 2023-09-01 MED ORDER — MOUNJARO 2.5 MG/0.5ML ~~LOC~~ SOAJ: 0.5000 mg | SUBCUTANEOUS | 5 refills | Status: DC

## 2023-09-01 MED ORDER — METFORMIN HCL ER 500 MG PO TB24: 1000.0000 mg | ORAL_TABLET | Freq: Two times a day (BID) | ORAL | 3 refills | Status: DC

## 2023-09-01 NOTE — Progress Notes (Signed)
Office Note 09/01/2023  CC:  Chief Complaint  Patient presents with   Establish Care    Pt wants to discuss his medication and possibly getting refills. Pt is fasting.   FLU given    HPI:  Eddie Garner is a 62 y.o. male who is here to establish care Patient's most recent primary MD: [facility] Old records were reviewed prior to or during today's visit.  Labs dated 02/27/2023 show: Hemoglobin A1c 7.5%, total cholesterol 65, triglyceride 98, HDL 24, LDL 22, glucose 154, serum creatinine 1.10, GFR 76.  Complete metabolic panel normal.  Eddie Garner was on [place] at 1 point but ran out of both.  He had some insurance gap after retiring.  He now has insurance again and would like to restart these. No home glucose monitoring. He takes 50 units of Tresiba daily and 1000 mg of Glucophage XR twice a day.  He has some intermittent pain in the metatarsal region on the right foot.  No burning, tingling, or numbness in the feet.  Past Medical History:  Diagnosis Date   Coronary artery disease    CVA (cerebral vascular accident) (HCC)    2023.  Residual dysesthesia R side of torso and R leg   Diabetes (HCC)    History of pneumonia    HTN (hypertension)    Hypercholesteremia    Obesity    Right thyroid nodule     Past Surgical History:  Procedure Laterality Date   ANTERIOR CERVICAL DECOMP/DISCECTOMY FUSION N/A 05/05/2021   Procedure: ANTERIOR CERVICAL DECOMPRESSION/DISCECTOMY FUSION 3 LEVELS C4-7;  Surgeon: Venita Lick, MD;  Location: [facility] OR;  Service: Orthopedics;  Laterality: N/A;  3.5 hrs   APPENDECTOMY     CARDIAC CATHETERIZATION     CARDIOVASCULAR STRESS TEST     2014 ETT neg, 2017 nuclear neg   COLONOSCOPY     CORONARY ANGIOPLASTY     DES LAD 10/28/08 (Dr. Eldridge Dace, [facility])   ELBOW SURGERY Left 07/09/2018   Dr. Melvyn Novas   FOOT SURGERY     HAND SURGERY Bilateral 07/09/2018   Dr. Melvyn Novas    knee surgery     SHOULDER ARTHROSCOPY  Left 01/19/2022   Procedure: Left shoulder arthroscopy, manipulation under anesthesia, evaluation under anesthesia, debridement;  Surgeon: Jene Every, MD;  Location: [facility] ORS;  Service: Orthopedics;  Laterality: Left;  Regional block   SHOULDER ARTHROSCOPY WITH ROTATOR CUFF REPAIR AND SUBACROMIAL DECOMPRESSION Left 04/16/2020   Procedure: SHOULDER ARTHROSCOPY WITH MINI OPEN ROTATOR CUFF REPAIR AND SUBACROMIAL DECOMPRESSION;  Surgeon: Jene Every, MD;  Location: [facility];  Service: Orthopedics;  Laterality: Left;  90 MINS   SHOULDER SURGERY Right 03/05/2018   Dr. Ranell Patrick.    THYROID LOBECTOMY Right 11/19/2018   Procedure: RIGHT THYROID LOBECTOMY;  Surgeon: Axel Filler, MD;  Location: [facility] OR;  Service: General;  Laterality: Right;   TRANSTHORACIC ECHOCARDIOGRAM     07/2022 nl LV/RV and valves    Family History  Problem Relation Age of Onset   Atrial fibrillation Father    Diabetes Father    Heart attack Father    Kidney disease Father    Coronary artery disease Maternal Grandfather    Diabetes Paternal Grandmother    CVA Paternal Grandfather    Stroke Paternal Grandfather     Social History   Socioeconomic History   Marital status: Married    Spouse name: Not on file   Number of children: Not on file   Years of education: Not  on file   Highest education level: Not on file  Occupational History   Not on file  Tobacco Use   Smoking status: Never    Passive exposure: Never   Smokeless tobacco: Never  Vaping Use   Vaping status: Never Used  Substance and Sexual Activity   Alcohol use: Yes    Comment: rare   Drug use: Never   Sexual activity: Yes    Partners: Female    Comment: married  Other Topics Concern   Not on file  Social History Narrative   Married,   Education: Some college   Occupation: Retired Copywriter, advertising   No tobacco   Social Determinants of Corporate investment banker Strain: Low Risk  (07/17/2023)   Received from [facility]   Overall Financial  Resource Strain (CARDIA)    Difficulty of Paying Living Expenses: Not very hard  Food Insecurity: No Food Insecurity (07/17/2023)   [facility] from [facility]   Hunger Vital Sign    Worried About Running Out of Food in the Last Year: Never true    Ran Out of Food in the Last Year: Never true  Transportation Needs: No Transportation Needs (07/17/2023)   Received from [facility] - Transportation    Lack of Transportation (Medical): No    Lack of Transportation (Non-Medical): No  Physical Activity: Sufficiently Active (07/17/2023)   Received from [facility]   Exercise Vital Sign    Days of Exercise per Week: 4 days    Minutes of Exercise per Session: 60 min  Stress: Stress Concern Present (07/17/2023)   Received from [facility] - Occupational Stress Questionnaire    Feeling of Stress : To some extent  Social Connections: Socially Integrated (07/17/2023)   Received from [facility]   Social Network    How would you rate your social network (family, work, friends)?: Good participation with social networks  Intimate Partner Violence: Not At Risk (07/17/2023)   Received from [facility]   HITS    Over the last 12 months how often did your partner physically hurt you?: 1    Over the last 12 months how often did your partner insult you or talk down to you?: 1    Over the last 12 months how often did your partner threaten you with physical harm?: 1    Over the last 12 months how often did your partner scream or curse at you?: 1    Outpatient Encounter Medications as of 09/01/2023  Medication Sig   aspirin EC 81 MG tablet Take 1 tablet (81 mg total) by mouth daily. Day after surgery   clopidogrel (PLAVIX) 75 MG tablet TAKE 1 TABLET BY MOUTH EVERY DAY WITH BREAKFAST   lisinopril (ZESTRIL) 10 MG tablet Take 1 tablet (10 mg total) by mouth daily.   nitroGLYCERIN (NITROSTAT) 0.4 MG SL tablet Place 1 tablet (0.4 mg total) under the  tongue every 5 (five) minutes as needed for chest pain.   Omega-3 Fatty Acids (FISH OIL) 1000 MG CAPS Take 1,000 mg by mouth daily.   rosuvastatin (CRESTOR) 40 MG tablet TAKE 1 TABLET BY MOUTH EVERY DAY   [DISCONTINUED] metFORMIN (GLUCOPHAGE-XR) 500 MG 24 hr tablet Take 1,000 mg by mouth 2 (two) times daily.   [DISCONTINUED] MOUNJARO 2.5 MG/0.5ML Pen Inject 0.5 mg into the skin once a week.   [DISCONTINUED] TRESIBA FLEXTOUCH 100 UNIT/ML FlexTouch Pen Inject 50 Units into the skin daily.  dapagliflozin propanediol (FARXIGA) 10 MG TABS tablet Take 1 tablet (10 mg total) by mouth daily with lunch.   metFORMIN (GLUCOPHAGE-XR) 500 MG 24 hr tablet Take 2 tablets (1,000 mg total) by mouth 2 (two) times daily.   MOUNJARO 2.5 MG/0.5ML Pen Inject 0.5 mg into the skin once a week.   TRESIBA FLEXTOUCH 100 UNIT/ML FlexTouch Pen Inject 50 Units into the skin daily.   [DISCONTINUED] dapagliflozin propanediol (FARXIGA) 10 MG TABS tablet Take 10 mg by mouth daily with lunch.  (Patient not taking: Reported on 09/01/2023)   No facility-administered encounter medications on file as of 09/01/2023.    No Known Allergies  Review of Systems  Constitutional:  Negative for appetite change, chills, fatigue and fever.  HENT:  Negative for congestion, dental problem, ear pain and sore throat.   Eyes:  Negative for discharge, redness and visual disturbance.  Respiratory:  Negative for cough, chest tightness, shortness of breath and wheezing.   Cardiovascular:  Negative for chest pain, palpitations and leg swelling.  Gastrointestinal:  Negative for abdominal pain, blood in stool, diarrhea, nausea and vomiting.  Genitourinary:  Negative for difficulty urinating, dysuria, flank pain, frequency, hematuria and urgency.  Musculoskeletal:  Negative for arthralgias, back pain, joint swelling, myalgias and neck stiffness.  Skin:  Negative for pallor and rash.  Neurological:  Negative for dizziness, speech difficulty, weakness  and headaches.  Hematological:  Negative for adenopathy. Does not bruise/bleed easily.  Psychiatric/Behavioral:  Negative for confusion and sleep disturbance. The patient is not nervous/anxious.     PE; Blood pressure 126/72, pulse 69, height 5\' 11"  (1.803 m), weight 241 lb 9.6 oz (109.6 kg), SpO2 96%. Body mass index is 33.7 kg/m.  Physical Exam  Gen: Alert, well appearing.  Patient is oriented to person, place, time, and situation. AFFECT: pleasant, lucid thought and speech. ENT: Ears: EACs clear, normal epithelium.  TMs with good light reflex and landmarks bilaterally.  Eyes: no injection, icteris, swelling, or exudate.  EOMI, PERRLA. Nose: no drainage or turbinate edema/swelling.  No injection or focal lesion.  Mouth: lips without lesion/swelling.  Oral mucosa pink and moist.  Dentition intact and without obvious caries or gingival swelling.  Oropharynx without erythema, exudate, or swelling.  Neck: supple/nontender.  No LAD, mass, or TM.  Carotid pulses 2+ bilaterally, without bruits. CV: RRR, no m/r/g.   LUNGS: CTA bilat, nonlabored resps, good aeration in all lung fields. ABD: soft, NT, ND, BS normal.  No hepatospenomegaly or mass.  No bruits. EXT: no clubbing, cyanosis, or edema.  Right foot with mild tenderness to palpation over the third metatarsal head. Musculoskeletal: no joint swelling, erythema, warmth, or tenderness.  ROM of all joints intact. Skin - no sores or suspicious lesions or rashes or color changes  Pertinent labs:  Last CBC Lab Results  Component Value Date   WBC 10.7 (H) 01/10/2022   HGB 15.0 01/10/2022   HCT 43.7 01/10/2022   MCV 90.7 01/10/2022   MCH 31.1 01/10/2022   RDW 13.2 01/10/2022   PLT 232 01/10/2022   Last metabolic panel Lab Results  Component Value Date   GLUCOSE 175 (H) 01/10/2022   NA 139 01/10/2022   K 4.2 01/10/2022   CL 105 01/10/2022   CO2 24 01/10/2022   BUN 25 (H) 01/10/2022   CREATININE 1.04 01/10/2022   GFRNONAA >60  01/10/2022   CALCIUM 8.7 (L) 01/10/2022   PROT 6.6 04/16/2021   ALBUMIN 4.8 04/16/2021   BILITOT 0.3 04/16/2021   ALKPHOS 111  04/16/2021   AST 13 04/16/2021   ALT 16 04/16/2021   ANIONGAP 10 01/10/2022   Last lipids Lab Results  Component Value Date   CHOL 86 (L) 04/16/2021   HDL 24 (L) 04/16/2021   LDLCALC 41 04/16/2021   TRIG 114 04/16/2021   CHOLHDL 3.6 04/16/2021   Last hemoglobin A1c Lab Results  Component Value Date   HGBA1C 7.7 (H) 01/10/2022   ASSESSMENT AND PLAN:   New patient, establishing care.  #1 health maintenance exam: Reviewed age and gender appropriate health maintenance issues (prudent diet, regular exercise, health risks of tobacco and excessive alcohol, use of seatbelts, fire alarms in home, use of sunscreen).  Also reviewed age and gender appropriate health screening as well as vaccine recommendations. Vaccines: Flu vaccine today.  He declined shingrix for now.  Otherwise all UTD Labs: fasting HP Prostate ca screening: PSA today Colon ca screening: UTD, most recent colonoscopy 05/2022 at [facility], normal per pt-->recall 10 yrs.  #2 diabetes without complication. Restart Farxiga 10 mg a day and Mounjaro 2.5 mg a day.  Continue Tresiba 50 units a day and metformin XR 1000 mg twice a day. Feet exam normal today. Hemoglobin A1c and urine microalbumin/creatinine today.  3.  Hypertension, well-controlled on lisinopril 10 mg a day. Electrolytes and creatinine monitoring today.  4.  Metatarsalgia. Recommended metatarsal pad as well as wide shoe.  #5 hypercholesterolemia, doing well on rosuvastatin 40 mg a day. Lipids and hepatic panel today.  An After Visit Summary was printed and given to the patient.  Return in about 3 months (around 12/02/2023) for routine chronic illness f/u.  Signed:  Santiago Bumpers, MD           09/01/2023

## 2023-09-01 NOTE — Patient Instructions (Signed)
Health Maintenance, Male Adopting a healthy lifestyle and getting preventive care are important in promoting health and wellness. Ask your health care provider about: The right schedule for you to have regular tests and exams. Things you can do on your own to prevent diseases and keep yourself healthy. What should I know about diet, weight, and exercise? Eat a healthy diet  Eat a diet that includes plenty of vegetables, fruits, low-fat dairy products, and lean protein. Do not eat a lot of foods that are high in solid fats, added sugars, or sodium. Maintain a healthy weight Body mass index (BMI) is a measurement that can be used to identify possible weight problems. It estimates body fat based on height and weight. Your health care provider can help determine your BMI and help you achieve or maintain a healthy weight. Get regular exercise Get regular exercise. This is one of the most important things you can do for your health. Most adults should: Exercise for at least 150 minutes each week. The exercise should increase your heart rate and make you sweat (moderate-intensity exercise). Do strengthening exercises at least twice a week. This is in addition to the moderate-intensity exercise. Spend less time sitting. Even light physical activity can be beneficial. Watch cholesterol and blood lipids Have your blood tested for lipids and cholesterol at 63 years of age, then have this test every 5 years. You may need to have your cholesterol levels checked more often if: Your lipid or cholesterol levels are high. You are older than 63 years of age. You are at high risk for heart disease. What should I know about cancer screening? Many types of cancers can be detected early and may often be prevented. Depending on your health history and family history, you may need to have cancer screening at various ages. This may include screening for: Colorectal cancer. Prostate cancer. Skin cancer. Lung  cancer. What should I know about heart disease, diabetes, and high blood pressure? Blood pressure and heart disease High blood pressure causes heart disease and increases the risk of stroke. This is more likely to develop in people who have high blood pressure readings or are overweight. Talk with your health care provider about your target blood pressure readings. Have your blood pressure checked: Every 3-5 years if you are 18-39 years of age. Every year if you are 40 years old or older. If you are between the ages of 65 and 75 and are a current or former smoker, ask your health care provider if you should have a one-time screening for abdominal aortic aneurysm (AAA). Diabetes Have regular diabetes screenings. This checks your fasting blood sugar level. Have the screening done: Once every three years after age 45 if you are at a normal weight and have a low risk for diabetes. More often and at a younger age if you are overweight or have a high risk for diabetes. What should I know about preventing infection? Hepatitis B If you have a higher risk for hepatitis B, you should be screened for this virus. Talk with your health care provider to find out if you are at risk for hepatitis B infection. Hepatitis C Blood testing is recommended for: Everyone born from 1945 through 1965. Anyone with known risk factors for hepatitis C. Sexually transmitted infections (STIs) You should be screened each year for STIs, including gonorrhea and chlamydia, if: You are sexually active and are younger than 63 years of age. You are older than 63 years of age and your   health care provider tells you that you are at risk for this type of infection. Your sexual activity has changed since you were last screened, and you are at increased risk for chlamydia or gonorrhea. Ask your health care provider if you are at risk. Ask your health care provider about whether you are at high risk for HIV. Your health care provider  may recommend a prescription medicine to help prevent HIV infection. If you choose to take medicine to prevent HIV, you should first get tested for HIV. You should then be tested every 3 months for as long as you are taking the medicine. Follow these instructions at home: Alcohol use Do not drink alcohol if your health care provider tells you not to drink. If you drink alcohol: Limit how much you have to 0-2 drinks a day. Know how much alcohol is in your drink. In the [place] one drink equals one 12 oz bottle of beer (355 mL), one 5 oz glass of wine (148 mL), or one 1 oz glass of hard liquor (44 mL). Lifestyle Do not use any products that contain nicotine or tobacco. These products include cigarettes, chewing tobacco, and vaping devices, such as e-cigarettes. If you need help quitting, ask your health care provider. Do not use street drugs. Do not share needles. Ask your health care provider for help if you need support or information about quitting drugs. General instructions Schedule regular health, dental, and eye exams. Stay current with your vaccines. Tell your health care provider if: You often feel depressed. You have ever been abused or do not feel safe at home. Summary Adopting a healthy lifestyle and getting preventive care are important in promoting health and wellness. Follow your health care provider's instructions about healthy diet, exercising, and getting tested or screened for diseases. Follow your health care provider's instructions on monitoring your cholesterol and blood pressure. This information is not intended to replace advice given to you by your health care provider. Make sure you discuss any questions you have with your health care provider. Document Revised: 03/08/2021 Document Reviewed: 03/08/2021 Elsevier Patient Education  2024 [employer]  

## 2023-10-03 ENCOUNTER — Encounter: Payer: Self-pay | Admitting: Neurology

## 2023-10-03 ENCOUNTER — Ambulatory Visit (INDEPENDENT_AMBULATORY_CARE_PROVIDER_SITE_OTHER): Payer: 59 | Admitting: Neurology

## 2023-10-03 VITALS — BP 150/84 | HR 74 | Ht 71.0 in | Wt 239.5 lb

## 2023-10-03 DIAGNOSIS — I639 Cerebral infarction, unspecified: Secondary | ICD-10-CM

## 2023-10-03 MED ORDER — ROSUVASTATIN CALCIUM 20 MG PO TABS: 20.0000 mg | ORAL_TABLET | Freq: Every day | ORAL | 3 refills | Status: DC

## 2023-10-03 NOTE — Patient Instructions (Addendum)
Decrease rosuvastatin to 20 mg nightly since LDL is 34 Continue your other medications Continue to follow-up with your doctors Return as needed or any other concerns.

## 2023-10-03 NOTE — Progress Notes (Signed)
[facility]  PATIENT: Eddie Garner DOB: 1961/06/07  REQUESTING CLINICIAN: Medicine, [facility]* HISTORY FROM: Patient REASON FOR VISIT: Stroke follow up    HISTORICAL  CHIEF COMPLAINT:  Chief Complaint  Patient presents with   Cerebrovascular Accident    Rm12, alone, 1 yr post Cva followup: pt stated that he is doing well, no concerns   INTERVAL HISTORY 10/03/2023 Patient presents today for follow-up, last visit was a year ago.  Since then, he has been doing well.  Denies any new strokelike symptoms.  He is compliant with his aspirin and Plavix.  He reports completing the cardiac monitor, was told no arrhythmia.  Overall he is doing well, except occasional tingling in the RLE but no weakness and no pain.  Denies any dizziness or double vision. He is gait is better.  LDL 34.   HISTORY OF PRESENT ILLNESS:  This is a 62 year old gentleman past medical history of hypertension, hyperlipidemia, diabetes mellitus and CAD s/p stent on Plavix who is presenting after being admitted to the hospital for a left medullary stroke.  Patient reported experiencing dizziness and  double vision 4 days prior to the presentation.  He initially presented to his primary care doctor who referred him to the ED for stroke rule out.  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  He is CTA also found intracranial narrowing.  Patient was on Plavix for CAD indication and aspirin was added. His echocardiogram showed an EF 60-65%. He was seen by PT OT and speech and not deemed to be a candidate for acute rehab. Since discharge from the hospital, he continued to have mild blurred vision and dizziness.  He reported when walking, he stumbles but denies any falls.     OTHER MEDICAL CONDITIONS: Hypertension, hyperlipidemia, diabetes mellitus, CAD.   REVIEW OF SYSTEMS: Full 14 system review of systems performed and negative with exception of: As noted in the HPI    ALLERGIES: No Known Allergies  HOME MEDICATIONS: Outpatient Medications Prior to Visit  Medication Sig Dispense Refill   aspirin EC 81 MG tablet Take 1 tablet (81 mg total) by mouth daily. Day after surgery 30 tablet 1   clopidogrel (PLAVIX) 75 MG tablet TAKE 1 TABLET BY MOUTH EVERY DAY WITH BREAKFAST 90 tablet 3   dapagliflozin propanediol (FARXIGA) 10 MG TABS tablet Take 1 tablet (10 mg total) by mouth daily with lunch. 90 tablet 3   lisinopril (ZESTRIL) 10 MG tablet Take 1 tablet (10 mg total) by mouth daily. 90 tablet 3   metFORMIN (GLUCOPHAGE-XR) 500 MG 24 hr tablet Take 2 tablets (1,000 mg total) by mouth 2 (two) times daily. 180 tablet 3   nitroGLYCERIN (NITROSTAT) 0.4 MG SL tablet Place 1 tablet (0.4 mg total) under the tongue every 5 (five) minutes as needed for chest pain. 25 tablet 6   Omega-3 Fatty Acids (FISH OIL) 1000 MG CAPS Take 1,000 mg by mouth daily.     TRESIBA FLEXTOUCH 100 UNIT/ML FlexTouch Pen Inject 50 Units into the skin daily. 15 mL 1   rosuvastatin (CRESTOR) 40 MG tablet TAKE 1 TABLET BY MOUTH EVERY DAY 90 tablet 3   MOUNJARO 2.5 MG/0.5ML Pen Inject 0.5 mg into the skin once a week. 2 mL 5   No facility-administered medications prior to visit.    PAST MEDICAL HISTORY: Past Medical History:  Diagnosis Date   Coronary artery disease    CVA (cerebral vascular accident) (HCC)  2023.  Residual dysesthesia R side of torso and R leg   Diabetes (HCC)    History of pneumonia    HTN (hypertension)    Hypercholesteremia    Obesity    Right thyroid nodule     PAST SURGICAL HISTORY: Past Surgical History:  Procedure Laterality Date   ANTERIOR CERVICAL DECOMP/DISCECTOMY FUSION N/A 05/05/2021   Procedure: ANTERIOR CERVICAL DECOMPRESSION/DISCECTOMY FUSION 3 LEVELS C4-7;  Surgeon: Venita Lick, MD;  Location: [facility] OR;  Service: Orthopedics;  Laterality: N/A;  3.5 hrs   APPENDECTOMY     CARDIAC CATHETERIZATION     CARDIOVASCULAR STRESS TEST     2014 ETT neg,  2017 nuclear neg   COLONOSCOPY     CORONARY ANGIOPLASTY     DES LAD 10/28/08 (Dr. Eldridge Dace, [facility])   ELBOW SURGERY Left 07/09/2018   Dr. Melvyn Novas   FOOT SURGERY     HAND SURGERY Bilateral 07/09/2018   Dr. Melvyn Novas    knee surgery     SHOULDER ARTHROSCOPY Left 01/19/2022   Procedure: Left shoulder arthroscopy, manipulation under anesthesia, evaluation under anesthesia, debridement;  Surgeon: Jene Every, MD;  Location: [facility] ORS;  Service: Orthopedics;  Laterality: Left;  Regional block   SHOULDER ARTHROSCOPY WITH ROTATOR CUFF REPAIR AND SUBACROMIAL DECOMPRESSION Left 04/16/2020   Procedure: SHOULDER ARTHROSCOPY WITH MINI OPEN ROTATOR CUFF REPAIR AND SUBACROMIAL DECOMPRESSION;  Surgeon: Jene Every, MD;  Location: [facility] ORS;  Service: Orthopedics;  Laterality: Left;  90 MINS   SHOULDER SURGERY Right 03/05/2018   Dr. Ranell Patrick.    THYROID LOBECTOMY Right 11/19/2018   Procedure: RIGHT THYROID LOBECTOMY;  Surgeon: Axel Filler, MD;  Location: [facility] OR;  Service: General;  Laterality: Right;   TRANSTHORACIC ECHOCARDIOGRAM     07/2022 nl LV/RV and valves    FAMILY HISTORY: Family History  Problem Relation Age of Onset   Atrial fibrillation Father    Diabetes Father    Heart attack Father    Kidney disease Father    Coronary artery disease Maternal Grandfather    Diabetes Paternal Grandmother    CVA Paternal Grandfather    Stroke Paternal Grandfather     SOCIAL HISTORY: Social History   Socioeconomic History   Marital status: Married    Spouse name: Not on file   Number of children: Not on file   Years of education: Not on file   Highest education level: Not on file  Occupational History   Not on file  Tobacco Use   Smoking status: Never    Passive exposure: Never   Smokeless tobacco: Never  Vaping Use   Vaping status: Never Used  Substance and Sexual Activity   Alcohol use: Yes    Comment: rare   Drug use: Never   Sexual activity: Yes    Partners: Female    Comment:  married  Other Topics Concern   Not on file  Social History Narrative   Married,   Education: Some college   Occupation: Retired Copywriter, advertising   No tobacco   Social Determinants of Corporate investment banker Strain: Low Risk  (07/17/2023)   Received from [facility]   Overall Financial Resource Strain (CARDIA)    Difficulty of Paying Living Expenses: Not very hard  Food Insecurity: No Food Insecurity (07/17/2023)   [facility]   Hunger Vital Sign    Worried About Running Out of Food in the Last Year: Never true    Ran Out of Food in the Last Year: Never true  Transportation Needs: No Transportation Needs (07/17/2023)   Received from [facility] - Transportation    Lack of Transportation (Medical): No    Lack of Transportation (Non-Medical): No  Physical Activity: Sufficiently Active (07/17/2023)   Received from [facility]   Exercise Vital Sign    Days of Exercise per Week: 4 days    Minutes of Exercise per Session: 60 min  Stress: Stress Concern Present (07/17/2023)   Received from [facility] - Occupational Stress Questionnaire    Feeling of Stress : To some extent  Social Connections: Socially Integrated (07/17/2023)   Received from [facility]   Social Network    How would you rate your social network (family, work, friends)?: Good participation with social networks  Intimate Partner Violence: Not At Risk (07/17/2023)   Received from [facility]   HITS    Over the last 12 months how often did your partner physically hurt you?: Never    Over the last 12 months how often did your partner insult you or talk down to you?: Never    Over the last 12 months how often did your partner threaten you with physical harm?: Never    Over the last 12 months how often did your partner scream or curse at you?: Never    PHYSICAL EXAM  GENERAL EXAM/CONSTITUTIONAL: Vitals:  Vitals:   10/03/23 0950  BP: (!)  150/84  Pulse: 74  Weight: 239 lb 8 oz (108.6 kg)  Height: 5\' 11"  (1.803 m)   Body mass index is 33.4 kg/m. Wt Readings from Last 3 Encounters:  10/03/23 239 lb 8 oz (108.6 kg)  09/01/23 241 lb 9.6 oz (109.6 kg)  12/16/22 236 lb (107 kg)   Patient is in no distress; well developed, nourished and groomed; neck is supple  MUSCULOSKELETAL: Gait, strength, tone, movements noted in Neurologic exam below  NEUROLOGIC: MENTAL STATUS:      No data to display         awake, alert, oriented to person, place and time recent and remote memory intact normal attention and concentration language fluent, comprehension intact, naming intact fund of knowledge appropriate  CRANIAL NERVE:  2nd, 3rd, 4th, 6th -visual fields full to confrontation, extraocular muscles intact, no nystagmus 5th - facial sensation symmetric 7th - facial strength symmetric 8th - hearing intact 9th - palate elevates symmetrically, uvula midline 11th - shoulder shrug symmetric 12th - tongue protrusion midline  MOTOR:  normal bulk and tone, full strength in the BUE, BLE  SENSORY:  normal and symmetric to light touch  COORDINATION:  finger-nose-finger, fine finger movements normal  REFLEXES:  deep tendon reflexes present and symmetric  GAIT/STATION:  Normal gait, normal tandem    DIAGNOSTIC DATA (LABS, IMAGING, TESTING) - I reviewed patient records, labs, notes, testing and imaging myself where available.  Lab Results  Component Value Date   WBC 7.3 09/01/2023   HGB 13.4 09/01/2023   HCT 42.0 09/01/2023   MCV 92.6 09/01/2023   PLT 252.0 09/01/2023      Component Value Date/Time   NA 140 09/01/2023 1008   K 4.7 09/01/2023 1008   CL 106 09/01/2023 1008   CO2 28 09/01/2023 1008   GLUCOSE 286 (H) 09/01/2023 1008   BUN 22 09/01/2023 1008   CREATININE 1.05 09/01/2023 1008   CALCIUM 9.0 09/01/2023 1008   PROT 6.6 09/01/2023 1008   PROT 6.6 04/16/2021 0724   ALBUMIN 4.3 09/01/2023 1008  ALBUMIN 4.8 04/16/2021 0724   AST 17 09/01/2023 1008   ALT 22 09/01/2023 1008   ALKPHOS 94 09/01/2023 1008   BILITOT 0.7 09/01/2023 1008   BILITOT 0.3 04/16/2021 0724   GFRNONAA >60 01/10/2022 1145   GFRAA >60 04/17/2020 0256   Lab Results  Component Value Date   CHOL 75 09/01/2023   HDL 26.20 (L) 09/01/2023   LDLCALC 34 09/01/2023   TRIG 74.0 09/01/2023   CHOLHDL 3 09/01/2023   Lab Results  Component Value Date   HGBA1C 11.1 (H) 09/01/2023   No results found for: "VITAMINB12" No results found for: "TSH"  MRI Brain 07/14/22 1.  Small acute infarct in the left lateral medulla.  2.  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  3.  Otherwise benign for age.   CTA Head and Neck 07/14/22 1.  Some wall thickening and plaque in the cervical carotids without stenosis. Heavy atherosclerosis of carotid siphons with possible mild stenosis.  2.  Focal area of narrowing in the inferior division of left MCA and also focal area of narrowing in the left P1 P2 junction.  3.  Otherwise benign.  Echocardiogram 07/15/22 Left Ventricle: Systolic function is normal. EF: 60-65%. Quantitative analysis of left ventricular Global Longitudinal Strain (GLS) imaging is -16.500%. Ejection fraction measured by 3D is 62%, which is normal.   ASSESSMENT AND PLAN  62 y.o. year old male with vascular risk factors including hypertension, hyperlipidemia, diabetes mellitus, and CAD who is presenting for follow up for left lateral medullary stroke.  Stroke etiology likely small vessel disease, he is currently on aspirin and Plavix, Crestor 40, his LDL is 34.  Will decrease Crestor to 20 mg nightly.  Advised patient to continue his other medications; we discussed exercise, maintaining good sleep, good diet and to continue following up with his doctors.  Advised him to return as needed.   1. Cerebrovascular accident (CVA), unspecified mechanism (HCC)     Patient Instructions  Decrease rosuvastatin to 20 mg  nightly since LDL is 34 Continue your other medications Continue to follow-up with your doctors Return as needed or any other concerns.  No orders of the defined types were placed in this encounter.   Meds ordered this encounter  Medications   rosuvastatin (CRESTOR) 20 MG tablet    Sig: Take 1 tablet (20 mg total) by mouth daily.    Dispense:  90 tablet    Refill:  3    Return if symptoms worsen or fail to improve.    Windell Norfolk, MD 10/03/2023, 10:43 AM  [facility] [address], [address] [place], [place] [address] [phone]

## 2023-10-05 ENCOUNTER — Telehealth: Payer: Self-pay

## 2023-10-05 ENCOUNTER — Other Ambulatory Visit (HOSPITAL_COMMUNITY): Payer: Self-pay

## 2023-10-05 NOTE — Telephone Encounter (Signed)
Pharmacy Patient Advocate Encounter   Received notification from CoverMyMeds that prior authorization for Rosuvastatin Calcium 20MG  tablets is required/requested.   Insurance verification completed.   The patient is insured through [facility] .   Per test claim: PA required; PA submitted to above mentioned insurance via CoverMyMeds Key/confirmation #/EOC [id number] Status is pending

## 2023-10-09 ENCOUNTER — Other Ambulatory Visit (HOSPITAL_COMMUNITY): Payer: Self-pay

## 2023-10-09 NOTE — Telephone Encounter (Signed)
Pharmacy Patient Advocate Encounter  Received notification from [facility] that Prior Authorization for Rosuvastatin Calcium 20MG  tablets has been APPROVED from 10/07/2023 to 10/05/2024   PA #/Case ID/Reference #: PA Case ID #: [id number]

## 2023-11-02 ENCOUNTER — Other Ambulatory Visit: Payer: Self-pay | Admitting: Interventional Cardiology

## 2023-11-23 ENCOUNTER — Other Ambulatory Visit: Payer: Self-pay | Admitting: Family Medicine

## 2023-11-29 ENCOUNTER — Other Ambulatory Visit: Payer: Self-pay

## 2023-11-29 MED ORDER — LISINOPRIL 10 MG PO TABS: 10.0000 mg | ORAL_TABLET | Freq: Every day | ORAL | 0 refills | Status: DC

## 2023-11-29 NOTE — Telephone Encounter (Signed)
Refill sent.

## 2023-12-04 ENCOUNTER — Ambulatory Visit: Payer: 59 | Admitting: Family Medicine

## 2023-12-08 ENCOUNTER — Ambulatory Visit: Payer: 59 | Admitting: Family Medicine

## 2024-01-04 ENCOUNTER — Other Ambulatory Visit: Payer: Self-pay | Admitting: Family Medicine

## 2024-01-04 ENCOUNTER — Other Ambulatory Visit: Payer: Self-pay

## 2024-01-04 MED ORDER — METFORMIN HCL ER 500 MG PO TB24: 1000.0000 mg | ORAL_TABLET | Freq: Two times a day (BID) | ORAL | 0 refills | Status: DC

## 2024-01-04 MED ORDER — DAPAGLIFLOZIN PROPANEDIOL 10 MG PO TABS: 10.0000 mg | ORAL_TABLET | Freq: Every day | ORAL | 0 refills | Status: DC

## 2024-01-04 MED ORDER — LISINOPRIL 10 MG PO TABS: 10.0000 mg | ORAL_TABLET | Freq: Every day | ORAL | 0 refills | Status: DC

## 2024-01-05 ENCOUNTER — Telehealth: Payer: Self-pay | Admitting: Cardiovascular Disease

## 2024-01-05 NOTE — Telephone Encounter (Signed)
 Called patient and made him an appointment to see Dr. Eden Emms.

## 2024-01-05 NOTE — Telephone Encounter (Signed)
 Left message for patient to call back

## 2024-01-05 NOTE — Telephone Encounter (Signed)
 Former patient of Dr. Eldridge Dace called in to schedule follow up, requesting Dr. Eden Emms. When advised that Dr. Eden Emms is not accepting provider switches or previous patient's of other doctors he insisted that he's family and he was advised to call the office to schedule. RN unavailable. Please clarify.

## 2024-01-05 NOTE — Progress Notes (Signed)
 Cardiology Office Note   Date:  01/12/2024   ID:  Eddie Garner, DOB Aug 06, 1961, MRN [id number]  PCP:  Eddie Massed, MD    CAD  Wt Readings from Last 3 Encounters:  01/12/24 244 lb (110.7 kg)  10/03/23 239 lb 8 oz (108.6 kg)  09/01/23 241 lb 9.6 oz (109.6 kg)       History of Present Illness: Eddie Garner is a 63 y.o. male  previously seen by Dr Eddie Garner and new to me.  History of CAD with LAD drug eluting stent in 2009.     Has had multiple ETT/myovues normal most recent 12/23/20 no ischemia EF 56%    DM poorly controlled last A1c in Epic 8.7   He has had shoulder, elbow and thyroid surgery.  No cardiac issues.  He was out of work.  He gained some weight back after that.      Visual changes in 9/23, double vision and was sent to the ER, [facility].  Diagnosed with a stroke.  Saw a neurologist who added the baby aspirin back. Carotid duplex 01/17/23 with 40-59% Left ICA stenosis Monitor 01/11/23 with no PAF NSR with PAC/PVC;s  September 2023 MRI: "Small acute infarct in the left lateral medulla.  2.  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  3.  Otherwise benign for age. "  Echocardiogram in September 2023 at [facility] showed normal LV/RV and valvular function.  Denies : Chest pain. Dizziness. Leg edema. Nitroglycerin use. Orthopnea. Palpitations. Paroxysmal nocturnal dyspnea. Shortness of breath. Syncope.    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    Past Medical History:  Diagnosis Date   Coronary artery disease    CVA (cerebral vascular accident) (HCC)    2023.  Residual dysesthesia R side of torso and R leg   Diabetes (HCC)    History of pneumonia    HTN (hypertension)    Hypercholesteremia    Obesity    Right thyroid nodule     Past Surgical History:  Procedure Laterality Date   ANTERIOR CERVICAL DECOMP/DISCECTOMY FUSION N/A 05/05/2021   Procedure: ANTERIOR CERVICAL  DECOMPRESSION/DISCECTOMY FUSION 3 LEVELS C4-7;  Surgeon: Eddie Lick, MD;  Location: [facility] OR;  Service: Orthopedics;  Laterality: N/A;  3.5 hrs   APPENDECTOMY     CARDIAC CATHETERIZATION     CARDIOVASCULAR STRESS TEST     2014 ETT neg, 2017 nuclear neg   COLONOSCOPY     CORONARY ANGIOPLASTY     DES LAD 10/28/08 (Dr. Eldridge Garner, [facility])   ELBOW SURGERY Left 07/09/2018   Dr. Melvyn Novas   FOOT SURGERY     HAND SURGERY Bilateral 07/09/2018   Dr. Melvyn Novas    knee surgery     SHOULDER ARTHROSCOPY Left 01/19/2022   Procedure: Left shoulder arthroscopy, manipulation under anesthesia, evaluation under anesthesia, debridement;  Surgeon: Eddie Every, MD;  Location: [facility] ORS;  Service: Orthopedics;  Laterality: Left;  Regional block   SHOULDER ARTHROSCOPY WITH ROTATOR CUFF REPAIR AND SUBACROMIAL DECOMPRESSION Left 04/16/2020   Procedure: SHOULDER ARTHROSCOPY WITH MINI OPEN ROTATOR CUFF REPAIR AND SUBACROMIAL DECOMPRESSION;  Surgeon: Eddie Every, MD;  Location: [facility];  Service: Orthopedics;  Laterality: Left;  90 MINS   SHOULDER SURGERY Right 03/05/2018   Dr. Ranell Garner.    THYROID LOBECTOMY Right 11/19/2018   Procedure: RIGHT THYROID LOBECTOMY;  Surgeon: Eddie Filler, MD;  [facility] OR;  Service: General;  Laterality: Right;   TRANSTHORACIC  ECHOCARDIOGRAM     07/2022 nl LV/RV and valves     Current Outpatient Medications  Medication Sig Dispense Refill   aspirin EC 81 MG tablet Take 1 tablet (81 mg total) by mouth daily. Day after surgery 30 tablet 1   clopidogrel (PLAVIX) 75 MG tablet TAKE 1 TABLET BY MOUTH Garner DAY WITH BREAKFAST 90 tablet 0   dapagliflozin propanediol (FARXIGA) 10 MG TABS tablet Take 1 tablet (10 mg total) by mouth daily with lunch. 30 tablet 0   lisinopril (ZESTRIL) 10 MG tablet Take 1 tablet (10 mg total) by mouth daily. 30 tablet 0   metFORMIN (GLUCOPHAGE-XR) 500 MG 24 hr tablet Take 2 tablets (1,000 mg total) by mouth 2 (two) times daily. 120 tablet 0   nitroGLYCERIN  (NITROSTAT) 0.4 MG SL tablet Place 1 tablet (0.4 mg total) under the tongue Garner 5 (five) minutes as needed for chest pain. 25 tablet 6   Omega-3 Fatty Acids (FISH OIL) 1000 MG CAPS Take 1,000 mg by mouth daily.     TRESIBA FLEXTOUCH 100 UNIT/ML FlexTouch Pen INJECT 50 UNITS INTO THE SKIN DAILY 15 mL 2   rosuvastatin (CRESTOR) 20 MG tablet Take 1 tablet (20 mg total) by mouth daily. 90 tablet 3   No current facility-administered medications for this visit.    Allergies:   Patient has no known allergies.    Social History:  The patient  reports that he has never smoked. He has never been exposed to tobacco smoke. He has never used smokeless tobacco. He reports current alcohol use. He reports that he does not use drugs.   Family History:  The patient's family history includes Atrial fibrillation in his father; CVA in his paternal grandfather; Coronary artery disease in his maternal grandfather; Diabetes in his father and paternal grandmother; Heart attack in his father; Kidney disease in his father; Stroke in his paternal grandfather.    ROS:  Please see the history of present illness.   Otherwise, review of systems are positive for  .   All other systems are reviewed and negative.    PHYSICAL EXAM: VS:  BP 136/88   Pulse 70   Ht 5' 10.5" (1.791 m)   Wt 244 lb (110.7 kg)   SpO2 98%   BMI 34.52 kg/m  , BMI Body mass index is 34.52 kg/m. GEN: Well nourished, well developed, in no acute distress HEENT: normal Neck: no JVD, carotid bruits, or masses Cardiac: RRR; no murmurs, rubs, or gallops,no edema  Respiratory:  clear to auscultation bilaterally, normal work of breathing GI: soft, nontender, nondistended, + BS MS: no deformity or atrophy Skin: warm and dry, no rash Neuro:  Strength and sensation are intact Psych: euthymic mood, full affect   EKG:   The ekg ordered today demonstrates NSR, inferior T wave inversions   Recent Labs: 09/01/2023: ALT 22; BUN 22; Creatinine, Ser  1.05; Hemoglobin 13.4; Platelets 252.0; Potassium 4.7; Sodium 140   Lipid Panel    Component Value Date/Time   CHOL 75 09/01/2023 1008   CHOL 86 (L) 04/16/2021 0724   TRIG 74.0 09/01/2023 1008   HDL 26.20 (L) 09/01/2023 1008   HDL 24 (L) 04/16/2021 0724   CHOLHDL 3 09/01/2023 1008   VLDL 14.8 09/01/2023 1008   LDLCALC 34 09/01/2023 1008   LDLCALC 41 04/16/2021 0724     Other studies Reviewed: Additional studies/ records that were reviewed today with results demonstrating: LDL 28 in 9/23.   ASSESSMENT AND PLAN:  CAD: Continue  aggressive secondary prevention.  Back on aspirin and Plavix at this time. No angina continue medical RX Diabetes: A1C high poor control f/u primary  Hyperlipidemia: Whole food, plant-based diet recommended.  High-fiber diet.  Lipids well-controlled. Continue Crestor Hypertension: The current medical regimen is effective;  continue present plan and medications. Carotid atherosclerosis: needs f/u duplex for left ICA 40-59%  Stroke:  on ASA/Plavix sees neurology small vessel not embolic left lateral medullary    Current medicines are reviewed at length with the patient today.  The patient concerns regarding his medicines were addressed.  The following changes have been made:  No change  Labs/ tests ordered today include:   Orders Placed This Encounter  Procedures   EKG 12-Lead   VAS US CAROTID   Carotid Duplex   Recommend 150 minutes/week of aerobic exercise Low fat, low carb, high fiber diet recommended  Disposition:   FU in 1 year   Signed, Charlton Haws, MD  01/12/2024 9:26 AM    [facility] [address] [place], [place], [place]  [address] Phone: [phone]; Fax: [phone]

## 2024-01-12 ENCOUNTER — Encounter: Payer: Self-pay | Admitting: Cardiovascular Disease

## 2024-01-12 ENCOUNTER — Ambulatory Visit: Attending: Cardiology | Admitting: Cardiovascular Disease

## 2024-01-12 VITALS — BP 136/88 | HR 70 | Ht 70.5 in | Wt 244.0 lb

## 2024-01-12 DIAGNOSIS — I779 Disorder of arteries and arterioles, unspecified: Secondary | ICD-10-CM

## 2024-01-12 DIAGNOSIS — E782 Mixed hyperlipidemia: Secondary | ICD-10-CM

## 2024-01-12 DIAGNOSIS — I10 Essential (primary) hypertension: Secondary | ICD-10-CM | POA: Diagnosis not present

## 2024-01-12 DIAGNOSIS — I25118 Atherosclerotic heart disease of native coronary artery with other forms of angina pectoris: Secondary | ICD-10-CM | POA: Diagnosis not present

## 2024-01-12 NOTE — Patient Instructions (Signed)
 Medication Instructions:  Your physician recommends that you continue on your current medications as directed. Please refer to the Current Medication list given to you today.  *If you need a refill on your cardiac medications before your next appointment, please call your pharmacy*  Lab Work: If you have labs (blood work) drawn today and your tests are completely normal, you will receive your results only by: MyChart Message (if you have MyChart) OR A paper copy in the mail If you have any lab test that is abnormal or we need to change your treatment, we will call you to review the results.   Testing/Procedures: Your physician has requested that you have a carotid duplex. This test is an ultrasound of the carotid arteries in your neck. It looks at blood flow through these arteries that supply the brain with blood. Allow one hour for this exam. There are no restrictions or special instructions.  Follow-Up: At [facility], you and your health needs are our priority.  As part of our continuing mission to provide you with exceptional heart care, we have created designated Provider Care Teams.  These Care Teams include your primary Cardiologist (physician) and Advanced Practice Providers (APPs -  Physician Assistants and Nurse Practitioners) who all work together to provide you with the care you need, when you need it.  We recommend signing up for the patient portal called "MyChart".  Sign up information is provided on this After Visit Summary.  MyChart is used to connect with patients for Virtual Visits (Telemedicine).  Patients are able to view lab/test results, encounter notes, upcoming appointments, etc.  Non-urgent messages can be sent to your provider as well.   To learn more about what you can do with MyChart, go to [email or web address].    Your next appointment:   6 months  Provider:   Charlton Haws, MD     Other Instructions       1st Floor: - Lobby - Registration  -  Pharmacy  - Lab - Cafe  2nd Floor: - PV Lab - Diagnostic Testing (echo, CT, nuclear med)  3rd Floor: - Vacant  4th Floor: - TCTS (cardiothoracic surgery) - AFib Clinic - Structural Heart Clinic - Vascular Surgery  - Vascular Ultrasound  5th Floor: - HeartCare Cardiology (general and EP) - Clinical Pharmacy for coumadin, hypertension, lipid, weight-loss medications, and med management appointments    Valet parking services will be available as well.

## 2024-01-24 ENCOUNTER — Other Ambulatory Visit: Payer: Self-pay

## 2024-02-08 ENCOUNTER — Other Ambulatory Visit: Payer: Self-pay | Admitting: Physician Assistant

## 2024-02-09 ENCOUNTER — Encounter: Admitting: Family Medicine

## 2024-02-15 ENCOUNTER — Ambulatory Visit (INDEPENDENT_AMBULATORY_CARE_PROVIDER_SITE_OTHER): Admitting: Family Medicine

## 2024-02-15 VITALS — BP 108/75 | HR 92 | Temp 98.3°F | Ht 70.75 in | Wt 240.2 lb

## 2024-02-15 DIAGNOSIS — I10 Essential (primary) hypertension: Secondary | ICD-10-CM

## 2024-02-15 DIAGNOSIS — E7800 Pure hypercholesterolemia, unspecified: Secondary | ICD-10-CM

## 2024-02-15 DIAGNOSIS — Z794 Long term (current) use of insulin: Secondary | ICD-10-CM

## 2024-02-15 DIAGNOSIS — Z7984 Long term (current) use of oral hypoglycemic drugs: Secondary | ICD-10-CM | POA: Diagnosis not present

## 2024-02-15 DIAGNOSIS — E119 Type 2 diabetes mellitus without complications: Secondary | ICD-10-CM | POA: Diagnosis not present

## 2024-02-15 LAB — POCT GLYCOSYLATED HEMOGLOBIN (HGB A1C)
HbA1c POC (<> result, manual entry): 8.8 % (ref 4.0–5.6)
HbA1c, POC (controlled diabetic range): 8.8 % — AB (ref 0.0–7.0)
HbA1c, POC (prediabetic range): 8.8 % — AB (ref 5.7–6.4)
Hemoglobin A1C: 8.8 % — AB (ref 4.0–5.6)

## 2024-02-15 MED ORDER — METFORMIN HCL ER 500 MG PO TB24: 1000.0000 mg | ORAL_TABLET | Freq: Two times a day (BID) | ORAL | 0 refills | Status: DC

## 2024-02-15 MED ORDER — LISINOPRIL 10 MG PO TABS: 10.0000 mg | ORAL_TABLET | Freq: Every day | ORAL | 0 refills | Status: DC

## 2024-02-15 MED ORDER — ROSUVASTATIN CALCIUM 20 MG PO TABS: 20.0000 mg | ORAL_TABLET | Freq: Every day | ORAL | 3 refills | Status: AC

## 2024-02-15 MED ORDER — CLOPIDOGREL BISULFATE 75 MG PO TABS: ORAL_TABLET | ORAL | 3 refills | Status: AC

## 2024-02-15 MED ORDER — TIRZEPATIDE 2.5 MG/0.5ML ~~LOC~~ SOAJ: 2.5000 mg | SUBCUTANEOUS | 0 refills | Status: DC

## 2024-02-15 MED ORDER — DAPAGLIFLOZIN PROPANEDIOL 10 MG PO TABS: 10.0000 mg | ORAL_TABLET | Freq: Every day | ORAL | 0 refills | Status: DC

## 2024-02-15 MED ORDER — TRESIBA FLEXTOUCH 100 UNIT/ML ~~LOC~~ SOPN: 50.0000 [IU] | PEN_INJECTOR | Freq: Every day | SUBCUTANEOUS | 2 refills | Status: DC

## 2024-02-15 NOTE — Progress Notes (Addendum)
 OFFICE VISIT  02/17/2024  CC:  Chief Complaint  Patient presents with   Annual Exam    Patient is a 63 y.o. male who presents for 2-month follow-up diabetes, hypertension, and hyperlipidemia. A/P as of last visit: #1 diabetes without complication. Restart Farxiga  10 mg a day and Mounjaro  2.5 mg a day.  Continue Tresiba  50 units a day and metformin  XR 1000 mg twice a day. Feet exam normal today. Hemoglobin A1c and urine microalbumin/creatinine today.   2.  Hypertension, well-controlled on lisinopril  10 mg a day. Electrolytes and creatinine monitoring today.   3.  Metatarsalgia. Recommended metatarsal pad as well as wide shoe.   4. hypercholesterolemia, doing well on rosuvastatin  40 mg a day. Lipids and hepatic panel today.  INTERIM HX: Unfortunately about 6 weeks ago he changed insurances and pharmacy and try to notify us  to send in new prescriptions to new pharmacy.  However, we did not take the step to make this happen. He had been on his metformin , Tresiba , Farxiga , and Mounjaro  prior to that. No home glucose monitoring.  He has no acute concerns.  ROS as above, plus--> no fevers, no CP, no SOB, no wheezing, no cough, no dizziness, no HAs, no rashes, no melena/hematochezia.  No polyuria or polydipsia.  No myalgias or arthralgias.  No focal weakness, paresthesias, or tremors.  No acute vision or hearing abnormalities.  No dysuria or unusual/new urinary urgency or frequency.  No recent changes in lower legs. No n/v/d or abd pain.  No palpitations.     Past Medical History:  Diagnosis Date   Coronary artery disease    CVA (cerebral vascular accident) (HCC)    2023.  Residual dysesthesia R side of torso and R leg   Diabetes (HCC)    History of pneumonia    HTN (hypertension)    Hypercholesteremia    Obesity    Right thyroid  nodule     Past Surgical History:  Procedure Laterality Date   ANTERIOR CERVICAL DECOMP/DISCECTOMY FUSION N/A 05/05/2021   Procedure: ANTERIOR  CERVICAL DECOMPRESSION/DISCECTOMY FUSION 3 LEVELS C4-7;  Surgeon: Burnetta Aures, MD;  Location: [facility] OR;  Service: Orthopedics;  Laterality: N/A;  3.5 hrs   APPENDECTOMY     CARDIAC CATHETERIZATION     CARDIOVASCULAR STRESS TEST     2014 ETT neg, 2017 nuclear neg   COLONOSCOPY     CORONARY ANGIOPLASTY     DES LAD 10/28/08 (Dr. Dann, [facility])   ELBOW SURGERY Left 07/09/2018   Dr. Shari   FOOT SURGERY     HAND SURGERY Bilateral 07/09/2018   Dr. Shari    knee surgery     SHOULDER ARTHROSCOPY Left 01/19/2022   Procedure: Left shoulder arthroscopy, manipulation under anesthesia, evaluation under anesthesia, debridement;  Surgeon: Duwayne Purchase, MD;  Location: [facility] ORS;  Service: Orthopedics;  Laterality: Left;  Regional block   SHOULDER ARTHROSCOPY WITH ROTATOR CUFF REPAIR AND SUBACROMIAL DECOMPRESSION Left 04/16/2020   Procedure: SHOULDER ARTHROSCOPY WITH MINI OPEN ROTATOR CUFF REPAIR AND SUBACROMIAL DECOMPRESSION;  Surgeon: Duwayne Purchase, MD;  Location: [facility];  Service: Orthopedics;  Laterality: Left;  90 MINS   SHOULDER SURGERY Right 03/05/2018   Dr. Kay.    THYROID  LOBECTOMY Right 11/19/2018   Procedure: RIGHT THYROID  LOBECTOMY;  Surgeon: Rubin Calamity, MD;  Location: [facility] OR;  Service: General;  Laterality: Right;   TRANSTHORACIC ECHOCARDIOGRAM     07/2022 nl LV/RV and valves    Outpatient Medications Prior to Visit  Medication Sig Dispense Refill  aspirin  EC 81 MG tablet Take 1 tablet (81 mg total) by mouth daily. Day after surgery 30 tablet 1   BD PEN NEEDLE NANO 2ND GEN 32G X 4 MM MISC USE ONCE DAILY AS INSTRUCTED     Omega-3 Fatty Acids (FISH OIL) 1000 MG CAPS Take 1,000 mg by mouth daily.     clopidogrel  (PLAVIX ) 75 MG tablet TAKE 1 TABLET BY MOUTH EVERY DAY WITH BREAKFAST 90 tablet 3   dapagliflozin  propanediol (FARXIGA ) 10 MG TABS tablet Take 1 tablet (10 mg total) by mouth daily with lunch. 30 tablet 0   lisinopril  (ZESTRIL ) 10 MG tablet Take 1 tablet (10 mg total)  by mouth daily. 30 tablet 0   metFORMIN  (GLUCOPHAGE -XR) 500 MG 24 hr tablet Take 2 tablets (1,000 mg total) by mouth 2 (two) times daily. 120 tablet 0   TRESIBA  FLEXTOUCH 100 UNIT/ML FlexTouch Pen INJECT 50 UNITS INTO THE SKIN DAILY 15 mL 2   nitroGLYCERIN  (NITROSTAT ) 0.4 MG SL tablet Place 1 tablet (0.4 mg total) under the tongue every 5 (five) minutes as needed for chest pain. (Patient not taking: Reported on 02/15/2024) 25 tablet 6   rosuvastatin  (CRESTOR ) 20 MG tablet Take 1 tablet (20 mg total) by mouth daily. 90 tablet 3   No facility-administered medications prior to visit.    No Known Allergies  Review of Systems As per HPI  PE:    02/15/2024    3:20 PM 01/12/2024    9:01 AM 10/03/2023    9:50 AM  Vitals with BMI  Height 5' 10.75 5' 10.5 5' 11  Weight 240 lbs 3 oz 244 lbs 239 lbs 8 oz  BMI 33.74 34.5 33.42  Systolic 108 136 849  Diastolic 75 88 84  Pulse 92 70 74     Physical Exam  Gen: Alert, well appearing.  Patient is oriented to person, place, time, and situation. AFFECT: pleasant, lucid thought and speech. No further exam today.  LABS:  Last CBC Lab Results  Component Value Date   WBC 7.3 09/01/2023   HGB 13.4 09/01/2023   HCT 42.0 09/01/2023   MCV 92.6 09/01/2023   MCH 31.1 01/10/2022   RDW 13.0 09/01/2023   PLT 252.0 09/01/2023   Last metabolic panel Lab Results  Component Value Date   GLUCOSE 127 (H) 02/15/2024   NA 142 02/15/2024   K 4.3 02/15/2024   CL 106 02/15/2024   CO2 22 02/15/2024   BUN 26 (H) 02/15/2024   CREATININE 1.05 02/15/2024   GFR 75.80 09/01/2023   CALCIUM  9.9 02/15/2024   PROT 6.6 09/01/2023   ALBUMIN 4.3 09/01/2023   BILITOT 0.7 09/01/2023   ALKPHOS 94 09/01/2023   AST 17 09/01/2023   ALT 22 09/01/2023   ANIONGAP 10 01/10/2022   Last lipids Lab Results  Component Value Date   CHOL 99 02/15/2024   HDL 26 (L) 02/15/2024   LDLCALC 47 02/15/2024   TRIG 190 (H) 02/15/2024   CHOLHDL 3.8 02/15/2024   Last  hemoglobin A1c Lab Results  Component Value Date   HGBA1C 8.8 (A) 02/15/2024   HGBA1C 8.8 02/15/2024   HGBA1C 8.8 (A) 02/15/2024   HGBA1C 8.8 (A) 02/15/2024   Lab Results  Component Value Date   PSA 1.51 09/01/2023   IMPRESSION AND PLAN:  #1 diabetes without complication. Control still poor but improved--> hemoglobin A1c today is 8.8%, down from 11.1%. Will get him back on all of his medications: Farxiga  10 mg a day, Tresiba  50 units today,  metformin  XR 1000 mg twice a day, and we will see if his current insurance will cover Mounjaro  2.5 mg weekly.  #2 hypertension, well-controlled on lisinopril  10 mg a day. Check electrolytes and creatinine today.  3.  Hypercholesterolemia.  Doing well on Crestor  20 mg a day. Has been out of this for about 6 weeks. Lipid panel today.  An After Visit Summary was printed and given to the patient.  FOLLOW UP: Return in about 3 months (around 05/16/2024) for routine chronic illness f/u.  Signed:  Gerlene Hockey, MD           02/17/2024

## 2024-02-16 ENCOUNTER — Other Ambulatory Visit (HOSPITAL_COMMUNITY): Payer: Self-pay

## 2024-02-16 ENCOUNTER — Telehealth: Payer: Self-pay

## 2024-02-16 ENCOUNTER — Encounter: Payer: Self-pay | Admitting: Family Medicine

## 2024-02-16 ENCOUNTER — Ambulatory Visit (HOSPITAL_COMMUNITY)
Admission: RE | Admit: 2024-02-16 | Discharge: 2024-02-16 | Disposition: A | Discharge status: Home / Self Care | Source: Ambulatory Visit | Attending: Cardiovascular Disease | Admitting: Cardiovascular Disease

## 2024-02-16 DIAGNOSIS — I779 Disorder of arteries and arterioles, unspecified: Secondary | ICD-10-CM | POA: Insufficient documentation

## 2024-02-16 LAB — BASIC METABOLIC PANEL WITH GFR
BUN/Creatinine Ratio: 25 (calc) — ABNORMAL HIGH (ref 6–22)
BUN: 26 mg/dL — ABNORMAL HIGH (ref 7–25)
CO2: 22 mmol/L (ref 20–32)
Calcium: 9.9 mg/dL (ref 8.6–10.3)
Chloride: 106 mmol/L (ref 98–110)
Creat: 1.05 mg/dL (ref 0.70–1.35)
Glucose, Bld: 127 mg/dL — ABNORMAL HIGH (ref 65–99)
Potassium: 4.3 mmol/L (ref 3.5–5.3)
Sodium: 142 mmol/L (ref 135–146)
eGFR: 80 mL/min/{1.73_m2} (ref >=60)

## 2024-02-16 LAB — LIPID PANEL
Cholesterol: 99 mg/dL (ref <200)
HDL: 26 mg/dL — ABNORMAL LOW (ref >=40)
LDL Cholesterol (Calc): 47 mg/dL
Non-HDL Cholesterol (Calc): 73 mg/dL (ref <130)
Total CHOL/HDL Ratio: 3.8 (calc) (ref <5.0)
Triglycerides: 190 mg/dL — ABNORMAL HIGH (ref <150)

## 2024-02-16 NOTE — Telephone Encounter (Signed)
 Pharmacy Patient Advocate Encounter   Received notification from Patient Pharmacy that prior authorization for Mounjaro  2.5 is required/requested.   Insurance verification completed.   The patient is insured through [employer] .   Per test claim: PA required; PA submitted to above mentioned insurance via CoverMyMeds Key/confirmation #/EOC [id number] Status is pending

## 2024-02-19 ENCOUNTER — Other Ambulatory Visit (HOSPITAL_COMMUNITY): Payer: Self-pay

## 2024-02-19 ENCOUNTER — Encounter: Payer: Self-pay | Admitting: Family Medicine

## 2024-02-21 ENCOUNTER — Other Ambulatory Visit (HOSPITAL_COMMUNITY): Payer: Self-pay

## 2024-02-22 ENCOUNTER — Other Ambulatory Visit (HOSPITAL_COMMUNITY): Payer: Self-pay

## 2024-02-26 ENCOUNTER — Other Ambulatory Visit (HOSPITAL_COMMUNITY): Payer: Self-pay

## 2024-02-26 NOTE — Telephone Encounter (Signed)
 Pharmacy Patient Advocate Encounter  Received notification from [facility] that Prior Authorization for Mounjaro 2.5 has been APPROVED from 02/25/24 to 02/24/25. Ran test claim, Copay is $852.39 due to patient deductible. This test claim was processed through [facility]- copay amounts may vary at other pharmacies due to pharmacy/plan contracts, or as the patient moves through the different stages of their insurance plan.   PA #/Case ID/Reference #: [id number]

## 2024-03-06 ENCOUNTER — Other Ambulatory Visit (HOSPITAL_COMMUNITY): Payer: Self-pay

## 2024-03-06 ENCOUNTER — Telehealth: Payer: Self-pay

## 2024-03-06 NOTE — Telephone Encounter (Signed)
 Pt stopped by the office to advise of his insurance. Pt has BC[employer] and states his PA was ran through [employer]. Pt needs PA for Mounjaro ran through [employer]

## 2024-03-06 NOTE — Telephone Encounter (Signed)
 Pharmacy Patient Advocate Encounter  Insurance verification completed.   The patient is insured through [employer] [place]     Ran test claim PT IS CORRECT UNDER [employer]  for Mounjaro 2.5MG /0.5ML auto-injectors. Currently a quantity of is a 28 day supply and the co-pay is $25.00 PA IS APPOVED UNTIL 02/16/2025.  This test claim was processed through [facility]- copay amounts may vary at other pharmacies due to pharmacy/plan contracts, or as the patient moves through the different stages of their insurance plan.

## 2024-03-06 NOTE — Telephone Encounter (Signed)
 Pt advised.

## 2024-03-11 ENCOUNTER — Ambulatory Visit: Payer: Self-pay

## 2024-03-11 NOTE — Telephone Encounter (Signed)
No further action needed. Pt scheduled.

## 2024-03-11 NOTE — Telephone Encounter (Signed)
 Copied from CRM [id number]. Topic: Clinical - Red Word Triage >> Mar 11, 2024  9:32 AM [place] A wrote: [facility] that prompted transfer to Nurse Triage: Patient hurt wrist a few weeks ago having awful pain   Chief Complaint: Wrist pain Symptoms: Right wrist pain Frequency: Constant  Pertinent Negatives: Patient denies any other symptoms  Disposition: [] ED /[] Urgent Care (no appt availability in office) / [x] Appointment(In office/virtual)/ []  [place] [facility]/ [] Home Care/ [] Refused Recommended Disposition /[] [place] [facility]/ []  Follow-up with PCP Additional Notes: Patient reports that for the last couple of weeks he has been experiencing right wrist pain. He states his pain started after he did a lot of work one week, but denies any known injury. Patient denies any other symptoms at this time. Appointment made for the patient on Wednesday for evaluation.     Reason for Disposition  [1] MODERATE pain (e.g., interferes with normal activities) AND [2] present > 3 days  Answer Assessment - Initial Assessment Questions 1. ONSET: "When did the pain start?"     A couple of weeks ago 2. LOCATION: "Where is the pain located?"     Right wrist  3. PAIN: "How bad is the pain?" (Scale 1-10; or mild, moderate, severe)   - MILD (1-3): doesn't interfere with normal activities   - MODERATE (4-7): interferes with normal activities (e.g., work or school) or awakens from sleep   - SEVERE (8-10): excruciating pain, unable to use hand at all     8/10 4. WORK OR EXERCISE: "Has there been any recent work or exercise that involved this part (i.e., hand or wrist) of the body?"     "I did a bunch of work that week and then woke up with the pain one day" 5. CAUSE: "What do you think is causing the pain?"     Unsure  6. AGGRAVATING FACTORS: "What makes the pain worse?" (e.g., using computer)     Using hand/wrist  7. OTHER SYMPTOMS: "Do you have any other symptoms?" (e.g., neck pain, swelling, rash,  numbness, fever)     No  Protocols used: Hand and Wrist Pain-A-AH

## 2024-03-13 ENCOUNTER — Ambulatory Visit: Admitting: Family Medicine

## 2024-03-13 ENCOUNTER — Encounter: Payer: Self-pay | Admitting: Family Medicine

## 2024-03-13 VITALS — BP 122/76 | HR 66 | Temp 98.0°F | Wt 249.0 lb

## 2024-03-13 DIAGNOSIS — K429 Umbilical hernia without obstruction or gangrene: Secondary | ICD-10-CM | POA: Diagnosis not present

## 2024-03-13 DIAGNOSIS — M654 Radial styloid tenosynovitis [de Quervain]: Secondary | ICD-10-CM

## 2024-03-13 DIAGNOSIS — M25531 Pain in right wrist: Secondary | ICD-10-CM | POA: Diagnosis not present

## 2024-03-13 MED ORDER — METHYLPREDNISOLONE ACETATE 80 MG/ML IJ SUSP
40.0000 mg | Freq: Once | INTRAMUSCULAR | Status: AC
  Administered 2024-03-13: 40 mg via INTRA_ARTICULAR

## 2024-03-13 NOTE — Progress Notes (Addendum)
 OFFICE VISIT  03/13/2024  CC:  Chief Complaint  Patient presents with   Wrist Pain    Right x 2 weeks; no known injury, pt would also like general surgery referral for hernia    Patient is a 63 y.o. male who presents for right wrist pain.  HPI: Approximately 3 weeks ago Eddie Garner woke up with some significant pain in the radial aspect of the right wrist.  No preceding injury such as a strain or trauma.  The pain is worse with wrist extension and inversion as well as thumb movements in essentially any direction.  There is a little bit of swelling over the radial aspect of the right wrist. He does drive a lot during his day and types a lot.  He is right-handed. Left hand/wrist does not bother him at all.  Review of systems: No fever, malaise, or other joint problems lately.  Past Medical History:  Diagnosis Date   Coronary artery disease    CVA (cerebral vascular accident) (HCC)    2023.  Residual dysesthesia R side of torso and R leg   Diabetes (HCC)    History of pneumonia    HTN (hypertension)    Hypercholesteremia    Obesity    Right thyroid  nodule     Past Surgical History:  Procedure Laterality Date   ANTERIOR CERVICAL DECOMP/DISCECTOMY FUSION N/A 05/05/2021   Procedure: ANTERIOR CERVICAL DECOMPRESSION/DISCECTOMY FUSION 3 LEVELS C4-7;  Surgeon: Mort Ards, MD;  Location: [facility] OR;  Service: Orthopedics;  Laterality: N/A;  3.5 hrs   APPENDECTOMY     CARDIAC CATHETERIZATION     CARDIOVASCULAR STRESS TEST     2014 ETT neg, 2017 nuclear neg   COLONOSCOPY     CORONARY ANGIOPLASTY     DES LAD 10/28/08 (Dr. Jacquelynn Matter, [facility])   ELBOW SURGERY Left 07/09/2018   Dr. Annamae Barrett   FOOT SURGERY     HAND SURGERY Bilateral 07/09/2018   Dr. Annamae Barrett    knee surgery     SHOULDER ARTHROSCOPY Left 01/19/2022   Procedure: Left shoulder arthroscopy, manipulation under anesthesia, evaluation under anesthesia, debridement;  Surgeon: Orvan Blanch, MD;  Location: [facility];  Service: Orthopedics;   Laterality: Left;  Regional block   SHOULDER ARTHROSCOPY WITH ROTATOR CUFF REPAIR AND SUBACROMIAL DECOMPRESSION Left 04/16/2020   Procedure: SHOULDER ARTHROSCOPY WITH MINI OPEN ROTATOR CUFF REPAIR AND SUBACROMIAL DECOMPRESSION;  Surgeon: Orvan Blanch, MD;  Location: [facility];  Service: Orthopedics;  Laterality: Left;  90 MINS   SHOULDER SURGERY Right 03/05/2018   Dr. Brunilda Capra.    THYROID  LOBECTOMY Right 11/19/2018   Procedure: RIGHT THYROID  LOBECTOMY;  Surgeon: Shela Derby, MD;  Location: [facility] OR;  Service: General;  Laterality: Right;   TRANSTHORACIC ECHOCARDIOGRAM     07/2022 nl LV/RV and valves   US  CAROTID DOPPLER BILATERAL ([facility] HX)     01/2024 no stenosis. Rpt 2 yrs.    Outpatient Medications Prior to Visit  Medication Sig Dispense Refill   aspirin  EC 81 MG tablet Take 1 tablet (81 mg total) by mouth daily. Day after surgery 30 tablet 1   BD PEN NEEDLE NANO 2ND GEN 32G X 4 MM MISC USE ONCE DAILY AS INSTRUCTED     clopidogrel  (PLAVIX ) 75 MG tablet TAKE 1 TABLET BY MOUTH EVERY DAY WITH BREAKFAST 90 tablet 3   dapagliflozin  propanediol (FARXIGA ) 10 MG TABS tablet Take 1 tablet (10 mg total) by mouth daily with lunch. 30 tablet 0   lisinopril  (ZESTRIL ) 10 MG tablet Take 1 tablet (  10 mg total) by mouth daily. 30 tablet 0   metFORMIN  (GLUCOPHAGE -XR) 500 MG 24 hr tablet Take 2 tablets (1,000 mg total) by mouth 2 (two) times daily. 120 tablet 0   Omega-3 Fatty Acids (FISH OIL) 1000 MG CAPS Take 1,000 mg by mouth daily.     rosuvastatin  (CRESTOR ) 20 MG tablet Take 1 tablet (20 mg total) by mouth daily. 90 tablet 3   tirzepatide (MOUNJARO) 2.5 MG/0.5ML Pen Inject 2.5 mg into the skin once a week. 2 mL 0   TRESIBA  FLEXTOUCH 100 UNIT/ML FlexTouch Pen Inject 50 Units into the skin daily. 15 mL 2   nitroGLYCERIN  (NITROSTAT ) 0.4 MG SL tablet Place 1 tablet (0.4 mg total) under the tongue every 5 (five) minutes as needed for chest pain. (Patient not taking: Reported on 03/13/2024) 25 tablet 6   No  facility-administered medications prior to visit.    No Known Allergies  Review of Systems  As per HPI  PE:    03/13/2024   10:53 AM 02/15/2024    3:20 PM 01/12/2024    9:01 AM  Vitals with BMI  Height  5' 10.75" 5' 10.5"  Weight 249 lbs 240 lbs 3 oz 244 lbs  BMI 34.98 33.74 34.5  Systolic 122 108 161  Diastolic 76 75 88  Pulse 66 92 70     Physical Exam  General: Alert and well-appearing. Affect is pleasant, speech and thought are lucid. Right wrist with mild soft tissue fullness over the radial aspect. Significant tenderness to palpation over the first extensor wrist compartment. Significant pain with wrist range of motion and thumb range of motion.  Finkelstein's positive. No crepitus.  Wrist and hand strength is fully intact.  No sensory abnormalities.  LABS:  Last metabolic panel Lab Results  Component Value Date   GLUCOSE 127 (H) 02/15/2024   NA 142 02/15/2024   K 4.3 02/15/2024   CL 106 02/15/2024   CO2 22 02/15/2024   BUN 26 (H) 02/15/2024   CREATININE 1.05 02/15/2024   EGFR 80 02/15/2024   CALCIUM  9.9 02/15/2024   PROT 6.6 09/01/2023   ALBUMIN 4.3 09/01/2023   BILITOT 0.7 09/01/2023   ALKPHOS 94 09/01/2023   AST 17 09/01/2023   ALT 22 09/01/2023   ANIONGAP 10 01/10/2022   Last hemoglobin A1c Lab Results  Component Value Date   HGBA1C 8.8 (A) 02/15/2024   HGBA1C 8.8 02/15/2024   HGBA1C 8.8 (A) 02/15/2024   HGBA1C 8.8 (A) 02/15/2024   Lab Results  Component Value Date   WBC 7.3 09/01/2023   HGB 13.4 09/01/2023   HCT 42.0 09/01/2023   MCV 92.6 09/01/2023   PLT 252.0 09/01/2023   IMPRESSION AND PLAN:  #1 right wrist pain consistent with de Quervain's tenosynovitis. Bedside MSK ultrasound today: There is slight anechoic distention of the sheath of the extensor pollicis brevis and abductor pollicis longus.  Tendons appear fully intact.  2nd and 3rd dorsal wrist compartments without fluid or tendon abnormality.  Distal radius appears normal and  the radioscaphoid joint appears without fluid or synovitis. First CMC joint without effusion or synovitis or degenerative changes.   Ultrasound-guided injection is preferred based on studies that show increased duration, increased effect, greater accuracy, decreased procedural pain, increased response rate, and decreased cost with ultrasound-guided versus blind injection. Procedure: Real-time ultrasound guided injection of right wrist first extensor compartment. Device: GE [employer] informed consent obtained.  Timeout conducted.  No overlying erythema, induration, or other signs of local infection.  After sterile prep with Betadine, injected a mixture of one half cc of 80 mg/cc Depo-Medrol and 1/2 cc of 2% plain lidocaine .  injectate seen filling tendon sheath. Patient tolerated the procedure well.  No immediate complications.  Post-injection care discussed. Advised to call if fever/chills, erythema, drainage, or persistent bleeding. Impression: Technically successful ultrasound-guided injection.  Recommended icing 20 minutes x 2 today. Thumb spica splint prescription given to patient today and encouraged him to wear it is much as he can for the next 5 days or so.  #2 umbilical hernia. Patient requested referral to general surgery today for an umbilical hernia that he is bothered by more more, says it is getting gradually larger as well. Referral to Dr. Oralee Billow ordered today.  An After Visit Summary was printed and given to the patient.  FOLLOW UP: Return if symptoms worsen or fail to improve.  Signed:  Arletha Lady, MD           03/13/2024

## 2024-03-14 ENCOUNTER — Other Ambulatory Visit: Payer: Self-pay | Admitting: Family Medicine

## 2024-03-19 ENCOUNTER — Other Ambulatory Visit: Payer: Self-pay | Admitting: Family Medicine

## 2024-03-19 ENCOUNTER — Encounter: Payer: Self-pay | Admitting: Family Medicine

## 2024-03-21 ENCOUNTER — Ambulatory Visit: Payer: Self-pay

## 2024-04-09 ENCOUNTER — Other Ambulatory Visit: Payer: Self-pay

## 2024-04-09 DIAGNOSIS — K420 Umbilical hernia with obstruction, without gangrene: Secondary | ICD-10-CM | POA: Diagnosis not present

## 2024-04-10 ENCOUNTER — Telehealth: Payer: Self-pay | Admitting: *Deleted

## 2024-04-10 ENCOUNTER — Telehealth: Payer: Self-pay

## 2024-04-10 NOTE — Telephone Encounter (Signed)
...     Pre-operative Risk Assessment    Patient Name: Eddie Garner  DOB: [birth year] MRN: [id number]   Date of last office visit: 01/12/24 Date of next office visit: NONE   Request for Surgical Clearance    Procedure:  HERNIA SURGERY  Date of Surgery:  Clearance TBD                                Surgeon:  DR Oralee Billow Surgeon's Group or Practice Name:  [facility] Phone number:  [phone] Fax number:  [phone]   Type of Clearance Requested:   - Medical  - Pharmacy:  Hold Aspirin  and Clopidogrel  (Plavix ) INSTRUCTION ON HOW TO HOLD   Type of Anesthesia:  General    Additional requests/questions:    Montel Antu   04/10/2024, 10:13 AM

## 2024-04-10 NOTE — Telephone Encounter (Signed)
 Pt has been scheduled tele preop appt 04/16/24, med rec and consent are done.     Patient Consent for Virtual Visit        Eddie Garner has provided verbal consent on 04/10/2024 for a virtual visit (video or telephone).   CONSENT FOR VIRTUAL VISIT FOR:  Eddie Garner  By participating in this virtual visit I agree to the following:  I hereby voluntarily request, consent and authorize [facility] and its employed or contracted physicians, physician assistants, nurse practitioners or other licensed health care professionals (the Practitioner), to provide me with telemedicine health care services (the "Services) as deemed necessary by the treating Practitioner. I acknowledge and consent to receive the Services by the Practitioner via telemedicine. I understand that the telemedicine visit will involve communicating with the Practitioner through live audiovisual communication technology and the disclosure of certain medical information by electronic transmission. I acknowledge that I have been given the opportunity to request an in-person assessment or other available alternative prior to the telemedicine visit and am voluntarily participating in the telemedicine visit.  I understand that I have the right to withhold or withdraw my consent to the use of telemedicine in the course of my care at any time, without affecting my right to future care or treatment, and that the Practitioner or I may terminate the telemedicine visit at any time. I understand that I have the right to inspect all information obtained and/or recorded in the course of the telemedicine visit and may receive copies of available information for a reasonable fee.  I understand that some of the potential risks of receiving the Services via telemedicine include:  Delay or interruption in medical evaluation due to technological equipment failure or disruption; Information transmitted may not be sufficient (e.g. poor  resolution of images) to allow for appropriate medical decision making by the Practitioner; and/or  In rare instances, security protocols could fail, causing a breach of personal health information.  Furthermore, I acknowledge that it is my responsibility to provide information about my medical history, conditions and care that is complete and accurate to the best of my ability. I acknowledge that Practitioner's advice, recommendations, and/or decision may be based on factors not within their control, such as incomplete or inaccurate data provided by me or distortions of diagnostic images or specimens that may result from electronic transmissions. I understand that the practice of medicine is not an exact science and that Practitioner makes no warranties or guarantees regarding treatment outcomes. I acknowledge that a copy of this consent can be made available to me via my patient portal [facility] MyChart), or I can request a printed copy by calling the office of [facility].    I understand that my insurance will be billed for this visit.   I have read or had this consent read to me. I understand the contents of this consent, which adequately explains the benefits and risks of the Services being provided via telemedicine.  I have been provided ample opportunity to ask questions regarding this consent and the Services and have had my questions answered to my satisfaction. I give my informed consent for the services to be provided through the use of telemedicine in my medical care

## 2024-04-10 NOTE — Telephone Encounter (Signed)
 Pt has been scheduled tele preop appt 04/16/24, med rec and consent are done.

## 2024-04-16 ENCOUNTER — Ambulatory Visit: Payer: Self-pay | Admitting: Surgery

## 2024-04-16 ENCOUNTER — Ambulatory Visit: Attending: Cardiology | Admitting: Student

## 2024-04-16 DIAGNOSIS — Z01810 Encounter for preprocedural cardiovascular examination: Secondary | ICD-10-CM

## 2024-04-16 NOTE — Progress Notes (Signed)
 Virtual Visit via Telephone Note   Because of Eddie Garner's co-morbid illnesses, he is at least at moderate risk for complications without adequate follow up.  This format is felt to be most appropriate for this patient at this time.  The patient did not have access to video technology/had technical difficulties with video requiring transitioning to audio format only (telephone).  All issues noted in this document were discussed and addressed.  No physical exam could be performed with this format.  Please refer to the patient's chart for his consent to telehealth for [facility].  Evaluation Performed:  Preoperative cardiovascular risk assessment _____________   Date:  04/16/2024   Patient ID:  Eddie Garner, DOB May 29, 1961, MRN [id number] Patient Location:  Home Provider location:   Office  Primary Care Provider:  Shelvia Dick, MD Primary Cardiologist:  Janelle Mediate, MD  Chief Complaint / Patient Profile   63 y.o. y/o male with a h/o CAD s/p PCI with DES to LAD 2009, hypertension, hyperlipidemia, T2DM, CVA, carotid atherosclerosis who is pending hernia repair by Dr. Sofia Dunn and presents today for telephonic preoperative cardiovascular risk assessment.  History of Present Illness    Eddie Garner is a 63 y.o. male who presents via audio/video conferencing for a telehealth visit today.  Pt was last seen in cardiology clinic on 01/12/2024 by Dr. Stann Earnest.  At that time Eddie Garner was stable from a cardiac standpoint.  The patient is now pending procedure as outlined above. Since his last visit, he is doing well. Patient denies shortness of breath, dyspnea on exertion, lower extremity edema, orthopnea or PND. No chest pain, pressure, or tightness. No palpitations. 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 He goes to the gym 1-2 times a week to do cardio and weights.   Past Medical History    Past Medical History:  Diagnosis Date   Coronary artery disease     CVA (cerebral vascular accident) (HCC)    2023.  Residual dysesthesia R side of torso and R leg   Diabetes (HCC)    History of pneumonia    HTN (hypertension)    Hypercholesteremia    Obesity    Right thyroid  nodule    Umbilical hernia    Past Surgical History:  Procedure Laterality Date   ANTERIOR CERVICAL DECOMP/DISCECTOMY FUSION N/A 05/05/2021   Procedure: ANTERIOR CERVICAL DECOMPRESSION/DISCECTOMY FUSION 3 LEVELS C4-7;  Surgeon: Mort Ards, MD;  Location: [facility] OR;  Service: Orthopedics;  Laterality: N/A;  3.5 hrs   APPENDECTOMY     CARDIAC CATHETERIZATION     CARDIOVASCULAR STRESS TEST     2014 ETT neg, 2017 nuclear neg   COLONOSCOPY     CORONARY ANGIOPLASTY     DES LAD 10/28/08 (Dr. Jacquelynn Matter, [facility])   ELBOW SURGERY Left 07/09/2018   Dr. Annamae Barrett   FOOT SURGERY     HAND SURGERY Bilateral 07/09/2018   Dr. Annamae Barrett    knee surgery     SHOULDER ARTHROSCOPY Left 01/19/2022   Procedure: Left shoulder arthroscopy, manipulation under anesthesia, evaluation under anesthesia, debridement;  Surgeon: Orvan Blanch, MD;  Location: [facility] ORS;  Service: Orthopedics;  Laterality: Left;  Regional block   SHOULDER ARTHROSCOPY WITH ROTATOR CUFF REPAIR AND SUBACROMIAL DECOMPRESSION Left 04/16/2020   Procedure: SHOULDER ARTHROSCOPY WITH MINI OPEN ROTATOR CUFF REPAIR AND SUBACROMIAL DECOMPRESSION;  Surgeon: Orvan Blanch, MD;  Location: [facility] ORS;  Service: Orthopedics;  Laterality: Left;  90 MINS   SHOULDER SURGERY Right 03/05/2018  Dr. Brunilda Capra.    THYROID  LOBECTOMY Right 11/19/2018   Procedure: RIGHT THYROID  LOBECTOMY;  Surgeon: Shela Derby, MD;  Location: [facility] OR;  Service: General;  Laterality: Right;   TRANSTHORACIC ECHOCARDIOGRAM     07/2022 nl LV/RV and valves   US  CAROTID DOPPLER BILATERAL ([facility] HX)     01/2024 no stenosis. Rpt 2 yrs.    Allergies  No Known Allergies  Home Medications    Prior to Admission medications   Medication Sig Start Date End Date Taking? Authorizing  Provider  aspirin  EC 81 MG tablet Take 1 tablet (81 mg total) by mouth daily. Day after surgery 01/20/22   Orvan Blanch, MD  BD PEN NEEDLE NANO 2ND GEN 32G X 4 MM MISC USE ONCE DAILY AS INSTRUCTED 08/30/23   [provider]  clopidogrel  (PLAVIX ) 75 MG tablet TAKE 1 TABLET BY MOUTH EVERY DAY WITH BREAKFAST 02/15/24   McGowen, Minetta Aly, MD  dapagliflozin  propanediol (FARXIGA ) 10 MG TABS tablet TAKE 1 TABLET(10 MG) BY MOUTH DAILY WITH LUNCH 03/19/24   McGowen, Minetta Aly, MD  lisinopril  (ZESTRIL ) 10 MG tablet TAKE 1 TABLET(10 MG) BY MOUTH DAILY 03/14/24   McGowen, Minetta Aly, MD  metFORMIN  (GLUCOPHAGE -XR) 500 MG 24 hr tablet TAKE 2 TABLETS(1000 MG) BY MOUTH TWICE DAILY 03/14/24   McGowen, Minetta Aly, MD  nitroGLYCERIN  (NITROSTAT ) 0.4 MG SL tablet Place 1 tablet (0.4 mg total) under the tongue every 5 (five) minutes as needed for chest pain. Patient not taking: Reported on 03/13/2024 12/16/22   Lucendia Rusk, MD  Omega-3 Fatty Acids (FISH OIL) 1000 MG CAPS Take 1,000 mg by mouth daily.    [provider]  rosuvastatin  (CRESTOR ) 20 MG tablet Take 1 tablet (20 mg total) by mouth daily. 02/15/24 05/15/24  McGowen, Minetta Aly, MD  tirzepatide  (MOUNJARO ) 2.5 MG/0.5ML Pen Inject 2.5 mg into the skin once a week. 02/15/24   McGowen, Minetta Aly, MD  TRESIBA  FLEXTOUCH 100 UNIT/ML FlexTouch Pen Inject 50 Units into the skin daily. 02/15/24   McGowen, Minetta Aly, MD    Physical Exam    Vital Signs:  Eddie Garner does not have vital signs available for review today.  Given telephonic nature of communication, physical exam is limited. AAOx3. NAD. Normal affect.  Speech and respirations are unlabored.   Assessment & Plan    Primary Cardiologist: Janelle Mediate, MD  Preoperative cardiovascular risk assessment.  Hernia repair by Dr. Sofia Dunn.  Chart reviewed as part of pre-operative protocol coverage. According to the RCRI, patient has a 6.6-10.1% risk of MACE. Patient reports activity equivalent to 4.0  METS (works full time and goes to the gym for cardio and weights 1-2 times a week).   Given past medical history and time since last visit, based on ACC/AHA guidelines, Eddie Garner would be at acceptable risk for the planned procedure without further cardiovascular testing.   Patient was advised that if he develops new symptoms prior to surgery to contact our office to arrange a follow-up appointment.  he verbalized understanding.  Per office protocol, he may hold Plavix  for 5 days prior to procedure and should resume as soon as hemodynamically stable postoperatively.  Ideally aspirin  should be continued without interruption, however if the bleeding risk is too great, aspirin  may be held for 5-7 days prior to surgery. Please resume aspirin  post operatively when it is felt to be safe from a bleeding standpoint.    I will route this recommendation to the requesting party via Epic fax  function.  Please call with questions.  Time:   Today, I have spent 5 minutes with the patient with telehealth technology discussing medical history, symptoms, and management plan.     Morey Ar, NP  04/16/2024, 7:41 AM

## 2024-04-18 NOTE — Patient Instructions (Addendum)
 SURGICAL WAITING ROOM VISITATION Patients having surgery or a procedure may have no more than 2 support people in the waiting area - these visitors may rotate.    Children under the age of 63 must have an adult with them who is not the patient.  If the patient needs to stay at the hospital during part of their recovery, the visitor guidelines for inpatient rooms apply. Pre-op nurse will coordinate an appropriate time for 1 support person to accompany patient in pre-op.  This support person may not rotate.    Please refer to the [facility] website for the visitor guidelines for Inpatients (after your surgery is over and you are in a regular room).       Your procedure is scheduled on: 04-26-24   Report to [facility] Main Entrance    Report to admitting at 6:45 AM   Call this number if you have problems the morning of surgery [phone]   Do not eat food :After Midnight.   After Midnight you may have the following liquids until 6:00 AM DAY OF SURGERY  Water  Non-Citrus Juices (without pulp, NO RED-Apple, White grape, White cranberry) Black Coffee (NO MILK/CREAM OR CREAMERS, sugar ok)  Clear Tea (NO MILK/CREAM OR CREAMERS, sugar ok) regular and decaf                             Plain Jell-O (NO RED)                                           Fruit ices (not with fruit pulp, NO RED)                                     Popsicles (NO RED)                                                               Sports drinks like Gatorade (NO RED)                       If you have questions, please contact your surgeon's office.   FOLLOW  ANY ADDITIONAL PRE OP INSTRUCTIONS YOU RECEIVED FROM YOUR SURGEON'S OFFICE!!!     Oral Hygiene is also important to reduce your risk of infection.                                    Remember - BRUSH YOUR TEETH THE MORNING OF SURGERY WITH YOUR REGULAR TOOTHPASTE   Do NOT smoke after Midnight   Take these medicines the morning of surgery with A SIP  OF WATER :    Rosuvastatin    Stop Plavix  5 days before surgery (last dose 04-20-24)  Stop all vitamins and herbal supplements 7 days before surgery  How to Manage Your Diabetes Before and After Surgery  Why is it important to control my blood sugar before and after surgery? Improving blood sugar levels before and after surgery helps healing and  can limit problems. A way of improving blood sugar control is eating a healthy diet by:  Eating less sugar and carbohydrates  Increasing activity/exercise  Talking with your doctor about reaching your blood sugar goals High blood sugars (greater than 180 mg/dL) can raise your risk of infections and slow your recovery, so you will need to focus on controlling your diabetes during the weeks before surgery. Make sure that the doctor who takes care of your diabetes knows about your planned surgery including the date and location.  How do I manage my blood sugar before surgery? Check your blood sugar at least 4 times a day, starting 2 days before surgery, to make sure that the level is not too high or low. Check your blood sugar the morning of your surgery when you wake up and every 2 hours until you get to the Short Stay unit. If your blood sugar is less than 70 mg/dL, you will need to treat for low blood sugar: Do not take insulin . Treat a low blood sugar (less than 70 mg/dL) with  cup of clear juice (cranberry or apple), 4 glucose tablets, OR glucose gel. Recheck blood sugar in 15 minutes after treatment (to make sure it is greater than 70 mg/dL). If your blood sugar is not greater than 70 mg/dL on recheck, call [phone] for further instructions. Report your blood sugar to the short stay nurse when you get to Short Stay.  If you are admitted to the hospital after surgery: Your blood sugar will be checked by the staff and you will probably be given insulin  after surgery (instead of oral diabetes medicines) to make sure you have good blood sugar  levels. The goal for blood sugar control after surgery is 80-180 mg/dL.   WHAT DO I DO ABOUT MY DIABETES MEDICATION?  Do not take oral diabetes medicines (pills) the morning of surgery.  THE NIGHT BEFORE SURGERY, take 25 units of  Tresiba  .      THE MORNING OF SURGERY, take   units of         insulin .         Hold Mounjaro  7 days before surgery (do not take after 04-18-24)         Hold Farxiga  3 days before surgery (do not take after 04-22-24)  DO NOT TAKE THE FOLLOWING 7 DAYS PRIOR TO SURGERY: Ozempic, Wegovy, Rybelsus (Semaglutide), Byetta (exenatide), Bydureon (exenatide ER), Victoza, Saxenda (liraglutide), or Trulicity (dulaglutide) Mounjaro  (Tirzepatide ) Adlyxin (Lixisenatide), Polyethylene Glycol Loxenatide.  Reviewed and Endorsed by [facility] Patient Education Committee, August 2015  Bring CPAP mask and tubing day of surgery.                              You may not have any metal on your body including  jewelry, and body piercing             Do not wear lotions, powders, cologne, or deodorant              Men may shave face and neck.   Do not bring valuables to the hospital. [facility] IS NOT RESPONSIBLE   FOR VALUABLES.   Contacts, dentures or bridgework may not be worn into surgery.  DO NOT BRING YOUR HOME MEDICATIONS TO THE HOSPITAL. PHARMACY WILL DISPENSE MEDICATIONS LISTED ON YOUR MEDICATION LIST TO YOU DURING YOUR ADMISSION IN THE HOSPITAL!    Patients discharged on the day of surgery will  not be allowed to drive home.  Someone NEEDS to stay with you for the first 24 hours after anesthesia.   Special Instructions: Bring a copy of your healthcare power of attorney and living will documents the day of surgery if you haven't scanned them before.              Please read over the following fact sheets you were given: IF YOU HAVE QUESTIONS ABOUT YOUR PRE-OP INSTRUCTIONS PLEASE CALL [phone] Gwen  If you received a COVID test during your pre-op visit  it is  requested that you wear a mask when out in public, stay away from anyone that may not be feeling well and notify your surgeon if you develop symptoms. If you test positive for Covid or have been in contact with anyone that has tested positive in the last 10 days please notify you surgeon.  [facility] - Preparing for Surgery Before surgery, you can play an important role.  Because skin is not sterile, your skin needs to be as free of germs as possible.  You can reduce the number of germs on your skin by washing with CHG (chlorahexidine gluconate) soap before surgery.  CHG is an antiseptic cleaner which kills germs and bonds with the skin to continue killing germs even after washing. Please DO NOT use if you have an allergy to CHG or antibacterial soaps.  If your skin becomes reddened/irritated stop using the CHG and inform your nurse when you arrive at Short Stay. Do not shave (including legs and underarms) for at least 48 hours prior to the first CHG shower.  You may shave your face/neck.  Please follow these instructions carefully:  1.  Shower with CHG Soap the night before surgery and the  morning of surgery.  2.  If you choose to wash your hair, wash your hair first as usual with your normal  shampoo.  3.  After you shampoo, rinse your hair and body thoroughly to remove the shampoo.                             4.  Use CHG as you would any other liquid soap.  You can apply chg directly to the skin and wash.  Gently with a scrungie or clean washcloth.  5.  Apply the CHG Soap to your body ONLY FROM THE NECK DOWN.   Do   not use on face/ open                           Wound or open sores. Avoid contact with eyes, ears mouth and   genitals (private parts).                       Wash face,  Genitals (private parts) with your normal soap.             6.  Wash thoroughly, paying special attention to the area where your    surgery  will be performed.  7.  Thoroughly rinse your body with warm water  from  the neck down.  8.  DO NOT shower/wash with your normal soap after using and rinsing off the CHG Soap.                9.  Pat yourself dry with a clean towel.            10.  Wear clean pajamas.            11.  Place clean sheets on your bed the night of your first shower and do not  sleep with pets. Day of Surgery : Do not apply any lotions/deodorants the morning of surgery.  Please wear clean clothes to the hospital/surgery center.  FAILURE TO FOLLOW THESE INSTRUCTIONS MAY RESULT IN THE CANCELLATION OF YOUR SURGERY  PATIENT SIGNATURE_________________________________  NURSE SIGNATURE__________________________________  ________________________________________________________________________

## 2024-04-18 NOTE — Progress Notes (Signed)
 COVID Vaccine Completed:  Yes  Date of COVID positive in last 90 days:  PCP - Janace Mckusick, MD Cardiologist - Janelle Mediate, MD Neurologist - Cassandra Cleveland, MD  Cardiac clearance in Epic   Chest x-ray -  EKG - 01-12-24 Epic Stress Test - 12-23-20 Epic ECHO - 07-15-22 [facility] Cardiac Cath - 10-28-08 Epic Pacemaker/ICD device last checked: Spinal Cord Stimulator: Long Term Monitor - 01-10-23 Epic  Bowel Prep -   Sleep Study -  CPAP -   Fasting Blood Sugar -  Checks Blood Sugar _____ times a day  Mounjaro  Last dose of GLP1 agonist-  N/A GLP1 instructions:  Do not take after  04-18-24   Farxiga   Last dose of SGLT-2 inhibitors-  N/A SGLT-2 instructions:  Do not take after  04-22-24  Blood Thinner Instructions:  Plavix   (hold x5 days) Last dose:   Time: Aspirin  Instructions:  ASA 81 Last Dose:  Activity level:  Can go up a flight of stairs and perform activities of daily living without stopping and without symptoms of chest pain or shortness of breath.  Able to exercise without symptoms  Unable to go up a flight of stairs without symptoms of     Anesthesia review:  CAD, hx of CVA,  HTN, DM  Patient denies shortness of breath, fever, cough and chest pain at PAT appointment  Patient verbalized understanding of instructions that were given to them at the PAT appointment. Patient was also instructed that they will need to review over the PAT instructions again at home before surgery.

## 2024-04-21 ENCOUNTER — Encounter (HOSPITAL_COMMUNITY): Payer: Self-pay | Admitting: Surgery

## 2024-04-21 DIAGNOSIS — K420 Umbilical hernia with obstruction, without gangrene: Secondary | ICD-10-CM | POA: Diagnosis present

## 2024-04-21 NOTE — H&P (Signed)
 REFERRING PHYSICIAN: McGowen, Aleene DEL, MD  PROVIDER: Rotha Cassels OZELL SPINNER, MD   Chief Complaint: New Consultation (Umbilical hernia)  History of Present Illness:  Patient is referred by his primary care physician, Dr. Aleene Hockey, for surgical evaluation and management of an incarcerated umbilical hernia. Patient states that this has been present for several years. It is gradually increased in size. It does not cause significant discomfort. Patient has had no signs or symptoms of obstruction. He has had a previous appendectomy at age 63. He has never had a hernia repair.  Of note, the patient does have a cardiac history. He has a stent in place. He is on Plavix  and aspirin . He is followed by Dr. Maude Emmer.  Review of Systems: A complete review of systems was obtained from the patient. I have reviewed this information and discussed as appropriate with the patient. See HPI as well for other ROS.  Review of Systems  Constitutional: Negative.  HENT: Negative.  Eyes: Negative.  Respiratory: Negative.  Cardiovascular: Negative.  Gastrointestinal: Negative.  Genitourinary: Negative.  Musculoskeletal: Positive for joint pain.  Skin: Negative.  Neurological: Positive for sensory change.  Endo/Heme/Allergies: Negative.  Psychiatric/Behavioral: Negative.    Medical History: Past Medical History:  Diagnosis Date  Diabetes mellitus without complication (CMS/HHS-HCC)  History of stroke  Hyperlipidemia  Hypertension   Patient Active Problem List  Diagnosis  History of CVA (cerebrovascular accident)  Type 2 diabetes mellitus with microalbuminuria, with long-term current use of insulin  (CMS/HHS-HCC)  Incarcerated umbilical hernia   Past Surgical History:  Procedure Laterality Date  APPENDECTOMY  thyroid  surgery    No Known Allergies  Current Outpatient Medications on File Prior to Visit  Medication Sig Dispense Refill  aspirin  81 MG EC tablet Take 81 mg by mouth   clopidogreL  (PLAVIX ) 75 mg tablet Take 75 mg by mouth daily with breakfast  FARXIGA  10 mg tablet Take 10 mg by mouth once daily  lisinopriL  (ZESTRIL ) 10 MG tablet Take 10 mg by mouth once daily  metFORMIN  (GLUCOPHAGE -XR) 500 MG XR tablet TAKE TWO TABLETS (1,000 MG DOSE) BY MOUTH 2 (TWO) TIMES DAILY.  MOUNJARO  2.5 mg/0.5 mL pen injector Inject 2.5 mg subcutaneously  rosuvastatin  (CRESTOR ) 40 MG tablet Take 40 mg by mouth once daily  TRESIBA  FLEXTOUCH U-100 pen injector (concentration 100 units/mL) INJECT 20 UNITS INTO THE SKIN DAILY   No current facility-administered medications on file prior to visit.   Family History  Problem Relation Age of Onset  High blood pressure (Hypertension) Father  Coronary Artery Disease (Blocked arteries around heart) Father  Diabetes Father    Social History   Tobacco Use  Smoking Status Never  Smokeless Tobacco Never    Social History   Socioeconomic History  Marital status: Married  Tobacco Use  Smoking status: Never  Smokeless tobacco: Never  Substance and Sexual Activity  Drug use: Never   Social Drivers of Corporate investment banker Strain: Low Risk (02/15/2024)  Received from [facility]  Overall Financial Resource Strain (CARDIA)  Difficulty of Paying Living Expenses: Not hard at all  Food Insecurity: No Food Insecurity (02/15/2024)  Received from [facility]  Hunger Vital Sign  Within the past 12 months, you worried that your food would run out before you got the money to buy more.: Never true  Within the past 12 months, the food you bought just didn't last and you didn't have money to get more.: Never true  Transportation Needs: No Transportation Needs (02/15/2024)  Received from [facility] - Transportation  Lack of Transportation (Medical): No  Lack of Transportation (Non-Medical): No  Physical Activity: Sufficiently Active (02/15/2024)  Received from [facility]  Exercise Vital Sign  On average, how many days per  week do you engage in moderate to strenuous exercise (like a brisk walk)?: 5 days  On average, how many minutes do you engage in exercise at this level?: 60 min  Stress: Stress Concern Present (02/15/2024)  Received from [facility] - Occupational Stress Questionnaire  Feeling of Stress : To some extent  Social Connections: Moderately Integrated (02/15/2024)  Received from [facility]  Social Connection and Isolation Panel  In a typical week, how many times do you talk on the phone with family, friends, or neighbors?: Three times a week  How often do you get together with friends or relatives?: Twice a week  How often do you attend church or religious services?: More than 4 times per year  Do you belong to any clubs or organizations such as church groups, unions, fraternal or athletic groups, or school groups?: No  Are you married, widowed, divorced, separated, never married, or living with a partner?: Married  Housing Stability: Unknown (04/09/2024)  Housing Stability Vital Sign  Homeless in the Last Year: No   Objective:   Vitals:  BP: (!) 178/71  Pulse: 77  Temp: 36.6 C (97.9 F)  SpO2: 96%  Weight: (!) 110.5 kg (243 lb 9.6 oz)  Height: 179.1 cm (5' 10.5)   Body mass index is 34.46 kg/m.  Physical Exam   GENERAL APPEARANCE Comfortable, no acute issues Development: normal Gross deformities: none  SKIN Rash, lesions, ulcers: none Induration, erythema: none Nodules: none palpable  EYES Conjunctiva and lids: normal Pupils: equal  EARS, NOSE, MOUTH, THROAT External ears: no lesion or deformity External nose: no lesion or deformity Hearing: grossly normal  NECK Symmetric: yes Trachea: midline Thyroid : no palpable nodules in the thyroid  bed; well-healed left anterior cervical incision  CHEST/CV Not assessed  ABDOMEN Soft without distention. There is an obvious umbilical hernia. On palpation this likely contains  incarcerated omentum. It is difficult to know for certain but the fascial defect is approximately 3 cm in diameter. Contents of the hernia are not reducible.  GENITOURINARY/RECTAL Not assessed  MUSCULOSKELETAL Station and gait: normal Digits and nails: no clubbing or cyanosis Muscle strength: grossly normal all extremities Deformity: none  LYMPHATIC Cervical: none palpable Supraclavicular: none palpable  PSYCHIATRIC Oriented to person, place, and time: yes Mood and affect: normal for situation Judgment and insight: appropriate for situation   Assessment and Plan:   Incarcerated umbilical hernia  Patient is referred by his primary care physician for surgical evaluation and management of an incarcerated umbilical hernia. Patient is provided with written literature on hernia surgery to review at home.  Today we reviewed his clinical history. We discussed hernia repair. We discussed the repair of the umbilical hernia in particular using a mesh patch. We discussed the size and location of the surgical incision. We discussed restrictions on his activities after the repair. We discussed the risk of recurrence. The patient understands and wishes to proceed with surgery in the near future. This will be done as an outpatient surgical procedure. We discussed his return to normal activities following the procedure.  Patient will require clearance from his cardiologist. He is currently taking Plavix  and aspirin . He has a history of coronary artery disease with a stent in place.  Krystal Spinner, MD [facility] practice Office: [phone]

## 2024-04-23 ENCOUNTER — Other Ambulatory Visit: Payer: Self-pay

## 2024-04-23 ENCOUNTER — Encounter (HOSPITAL_COMMUNITY): Payer: Self-pay

## 2024-04-23 ENCOUNTER — Encounter (HOSPITAL_COMMUNITY)
Admission: RE | Admit: 2024-04-23 | Discharge: 2024-04-23 | Disposition: A | Discharge status: Home / Self Care | Source: Ambulatory Visit | Attending: Surgery | Admitting: Surgery

## 2024-04-23 VITALS — BP 151/81 | HR 64 | Temp 98.2°F | Resp 16 | Ht 70.5 in | Wt 247.6 lb

## 2024-04-23 DIAGNOSIS — I10 Essential (primary) hypertension: Secondary | ICD-10-CM | POA: Insufficient documentation

## 2024-04-23 DIAGNOSIS — K420 Umbilical hernia with obstruction, without gangrene: Secondary | ICD-10-CM | POA: Diagnosis not present

## 2024-04-23 DIAGNOSIS — Z955 Presence of coronary angioplasty implant and graft: Secondary | ICD-10-CM | POA: Diagnosis not present

## 2024-04-23 DIAGNOSIS — E785 Hyperlipidemia, unspecified: Secondary | ICD-10-CM | POA: Diagnosis not present

## 2024-04-23 DIAGNOSIS — Z01812 Encounter for preprocedural laboratory examination: Secondary | ICD-10-CM | POA: Insufficient documentation

## 2024-04-23 DIAGNOSIS — Z794 Long term (current) use of insulin: Secondary | ICD-10-CM | POA: Insufficient documentation

## 2024-04-23 DIAGNOSIS — Z7902 Long term (current) use of antithrombotics/antiplatelets: Secondary | ICD-10-CM | POA: Diagnosis not present

## 2024-04-23 DIAGNOSIS — R208 Other disturbances of skin sensation: Secondary | ICD-10-CM | POA: Diagnosis not present

## 2024-04-23 DIAGNOSIS — Z7982 Long term (current) use of aspirin: Secondary | ICD-10-CM | POA: Diagnosis not present

## 2024-04-23 DIAGNOSIS — Z7984 Long term (current) use of oral hypoglycemic drugs: Secondary | ICD-10-CM | POA: Diagnosis not present

## 2024-04-23 DIAGNOSIS — E119 Type 2 diabetes mellitus without complications: Secondary | ICD-10-CM | POA: Diagnosis not present

## 2024-04-23 DIAGNOSIS — Z79899 Other long term (current) drug therapy: Secondary | ICD-10-CM | POA: Diagnosis not present

## 2024-04-23 DIAGNOSIS — Z8673 Personal history of transient ischemic attack (TIA), and cerebral infarction without residual deficits: Secondary | ICD-10-CM | POA: Insufficient documentation

## 2024-04-23 DIAGNOSIS — Z981 Arthrodesis status: Secondary | ICD-10-CM | POA: Diagnosis not present

## 2024-04-23 DIAGNOSIS — I2510 Atherosclerotic heart disease of native coronary artery without angina pectoris: Secondary | ICD-10-CM | POA: Diagnosis not present

## 2024-04-23 DIAGNOSIS — Z9049 Acquired absence of other specified parts of digestive tract: Secondary | ICD-10-CM | POA: Diagnosis not present

## 2024-04-23 DIAGNOSIS — Z7985 Long-term (current) use of injectable non-insulin antidiabetic drugs: Secondary | ICD-10-CM | POA: Diagnosis not present

## 2024-04-23 DIAGNOSIS — I69398 Other sequelae of cerebral infarction: Secondary | ICD-10-CM | POA: Diagnosis not present

## 2024-04-23 LAB — HEMOGLOBIN A1C
Hemoglobin A1C: 8.5
Hgb A1c MFr Bld: 8.5 % — ABNORMAL HIGH (ref 4.8–5.6)
Mean Plasma Glucose: 197.25 mg/dL

## 2024-04-23 LAB — GLUCOSE, CAPILLARY: Glucose-Capillary: 154 mg/dL — ABNORMAL HIGH (ref 70–99)

## 2024-04-23 LAB — BASIC METABOLIC PANEL WITH GFR
Anion gap: 10 (ref 5–15)
BUN: 19 mg/dL (ref 8–23)
CO2: 24 mmol/L (ref 22–32)
Calcium: 8.7 mg/dL — ABNORMAL LOW (ref 8.9–10.3)
Chloride: 107 mmol/L (ref 98–111)
Creatinine, Ser: 0.89 mg/dL (ref 0.61–1.24)
GFR, Estimated: >60 mL/min (ref >60)
Glucose, Bld: 163 mg/dL — ABNORMAL HIGH (ref 70–99)
Potassium: 4.3 mmol/L (ref 3.5–5.1)
Sodium: 141 mmol/L (ref 135–145)

## 2024-04-24 NOTE — Anesthesia Preprocedure Evaluation (Addendum)
 Anesthesia Evaluation  Patient identified by MRN, date of birth, ID band Patient awake    Reviewed: Allergy & Precautions, NPO status , Patient's Chart, lab work & pertinent test results  History of Anesthesia Complications Negative for: history of anesthetic complications  Airway Mallampati: III  TM Distance: >3 FB Neck ROM: Full   Comment: Previous grade II view with MAC 4, easy mask Dental  (+) Dental Advisory Given   Pulmonary neg pulmonary ROS   Pulmonary exam normal breath sounds clear to auscultation       Cardiovascular hypertension (lisinopril ), Pt. on medications (-) angina + CAD and + Cardiac Stents (DES to LAD in 2009)  (-) CABG (-) dysrhythmias  Rhythm:Regular Rate:Normal  HLD  TTE 07/15/2022: Impression  Left Ventricle: Systolic function is normal. EF: 60-65%. Quantitative analysis of left ventricular Global Longitudinal Strain (GLS) imaging is -16.500%. Ejection fraction measured by 3D is 62%, which is normal  Low-risk stress test 12/23/2020   Neuro/Psych neg Seizures S/p ACDF CVA (2023, dysethesia right side), Residual Symptoms    GI/Hepatic negative GI ROS, Neg liver ROS,,,  Endo/Other  diabetes (Hgb A1c 8.5), Poorly Controlled, Type 2, Oral Hypoglycemic Agents, Insulin  Dependent  Right thyroid  nodule s/p lobectomy  Renal/GU negative Renal ROS     Musculoskeletal   Abdominal  (+) + obese  Peds  Hematology negative hematology ROS (+)   Anesthesia Other Findings Last Plavix : 04/20/2024  Last Farxiga : 04/22/2024  Last Mounjaro : 3 weeks ago  Reproductive/Obstetrics                             Anesthesia Physical Anesthesia Plan  ASA: 3  Anesthesia Plan: General   Post-op Pain Management: Tylenol  PO (pre-op)*   Induction: Intravenous  PONV Risk Score and Plan: 2 and Ondansetron , Dexamethasone  and Treatment may vary due to age or medical condition  Airway  Management Planned: Oral ETT  Additional Equipment:   Intra-op Plan:   Post-operative Plan: Extubation in OR  Informed Consent: I have reviewed the patients History and Physical, chart, labs and discussed the procedure including the risks, benefits and alternatives for the proposed anesthesia with the patient or authorized representative who has indicated his/her understanding and acceptance.     Dental advisory given  Plan Discussed with: CRNA and Anesthesiologist  Anesthesia Plan Comments: (See PAT note 04/23/2024  Risks of general anesthesia discussed including, but not limited to, sore throat, hoarse voice, chipped/damaged teeth, injury to vocal cords, nausea and vomiting, allergic reactions, lung infection, heart attack, stroke, and death. All questions answered. )       Anesthesia Quick Evaluation

## 2024-04-24 NOTE — Progress Notes (Signed)
 Anesthesia Chart Review   Case: [id number] Date/Time: 04/26/24 0845   Procedure: REPAIR, HERNIA, UMBILICAL, ADULT - OPEN REPAIR UMBILICAL HERNIA WITH MESH PATCH   Anesthesia type: General   Diagnosis: Incarcerated umbilical hernia [K42.0]   Pre-op diagnosis: INCARCERATED UMBILICAL HERNIA   Location[facility] ROOM 01 / [facility]   Surgeons: Eletha Boas, MD       DISCUSSION:63 y.o. never smoker with h/o HTN, CAD s/p PCI with DES to LAD 2009, CVA, DM II, incarcerated umbilical hernia scheduled for above procedure 04/26/2024 with Dr. Boas Eletha.   Per preoperative evaluation 04/16/2024, Chart reviewed as part of pre-operative protocol coverage. According to the RCRI, patient has a 6.6-10.1% risk of MACE. Patient reports activity equivalent to 4.0 METS (works full time and goes to the gym for cardio and weights 1-2 times a week).    Given past medical history and time since last visit, based on ACC/AHA guidelines, Eddie Garner would be at acceptable risk for the planned procedure without further cardiovascular testing.    Patient was advised that if he develops new symptoms prior to surgery to contact our office to arrange a follow-up appointment.  he verbalized understanding.   Per office protocol, he may hold Plavix  for 5 days prior to procedure and should resume as soon as hemodynamically stable postoperatively.  Ideally aspirin  should be continued without interruption, however if the bleeding risk is too great, aspirin  may be held for 5-7 days prior to surgery. Please resume aspirin  post operatively when it is felt to be safe from a bleeding standpoint.  A1C 8.5, this is actually improved from 11.1 in November and 8.8 in April.  VS: BP (!) 151/81   Pulse 64   Temp 36.8 C (Oral)   Resp 16   Ht 5' 10.5 (1.791 m)   Wt 112.3 kg   SpO2 99%   BMI 35.02 kg/m   PROVIDERS: McGowen, Aleene DEL, MD  Cardiologist - Maude Emmer, MD  Neurologist - Pastor Falling, MD LABS: Labs reviewed:  Acceptable for surgery. (all labs ordered are listed, but only abnormal results are displayed)  Labs Reviewed  HEMOGLOBIN A1C - Abnormal; Notable for the following components:      Result Value   Hgb A1c MFr Bld 8.5 (*)    All other components within normal limits  BASIC METABOLIC PANEL WITH GFR - Abnormal; Notable for the following components:   Glucose, Bld 163 (*)    Calcium  8.7 (*)    All other components within normal limits  GLUCOSE, CAPILLARY - Abnormal; Notable for the following components:   Glucose-Capillary 154 (*)    All other components within normal limits     IMAGES: VAS US  Carotid 02/16/2024 Summary:  Right Carotid: Velocities in the right ICA are consistent with a 1-39%  stenosis.   Left Carotid: Velocities in the left ICA are consistent with a 1-39%  stenosis.   EKG:   CV: Myocardial Perfusion 12/23/2020 The left ventricular ejection fraction is normal (55-65%). Nuclear stress EF: 56%. Blood pressure demonstrated a hypertensive response to exercise. Horizontal ST segment depression ST segment depression was noted during stress in the II, III, aVF, V5 and V6 leads, beginning at 9 minutes of stress, and returning to baseline after 1-5 minutes of recovery. 1.5 mm The study is normal. This is a low risk study.   Exercise time: 9:30 min Workload: 10.9 METS, excellent exercise capacity Test stopped due to: fatigue BP response: hypertensive response to exercise HR response: normal Rhythm:  SR Borderline ischemic changes on ECG at peak stress, resolving within 5 minutes of recovery.    No ischemia or infarction on perfusion images. Normal wall motion. Study is low risk at excellent exercise capacity. Past Medical History:  Diagnosis Date   Coronary artery disease    CVA (cerebral vascular accident) (HCC)    2023.  Residual dysesthesia R side of torso and R leg   Diabetes (HCC)    History of pneumonia    HTN (hypertension)    Hypercholesteremia     Obesity    Right thyroid  nodule    Umbilical hernia     Past Surgical History:  Procedure Laterality Date   ANTERIOR CERVICAL DECOMP/DISCECTOMY FUSION N/A 05/05/2021   Procedure: ANTERIOR CERVICAL DECOMPRESSION/DISCECTOMY FUSION 3 LEVELS C4-7;  Surgeon: Burnetta Aures, MD;  Location: [facility];  Service: Orthopedics;  Laterality: N/A;  3.5 hrs   APPENDECTOMY     CARDIAC CATHETERIZATION     CARDIOVASCULAR STRESS TEST     2014 ETT neg, 2017 nuclear neg   COLONOSCOPY     CORONARY ANGIOPLASTY     DES LAD 10/28/08 (Dr. Dann, [facility])   ELBOW SURGERY Left 07/09/2018   Dr. Shari   FOOT SURGERY     HAND SURGERY Bilateral 07/09/2018   Dr. Shari    knee surgery     SHOULDER ARTHROSCOPY Left 01/19/2022   Procedure: Left shoulder arthroscopy, manipulation under anesthesia, evaluation under anesthesia, debridement;  Surgeon: Duwayne Purchase, MD;  Location[facility];  Service: Orthopedics;  Laterality: Left;  Regional block   SHOULDER ARTHROSCOPY WITH ROTATOR CUFF REPAIR AND SUBACROMIAL DECOMPRESSION Left 04/16/2020   Procedure: SHOULDER ARTHROSCOPY WITH MINI OPEN ROTATOR CUFF REPAIR AND SUBACROMIAL DECOMPRESSION;  Surgeon: Duwayne Purchase, MD;  Location: [facility];  Service: Orthopedics;  Laterality: Left;  90 MINS   SHOULDER SURGERY Right 03/05/2018   Dr. Kay.    THYROID  LOBECTOMY Right 11/19/2018   Procedure: RIGHT THYROID  LOBECTOMY;  Surgeon: Rubin Calamity, MD;  Location: [facility] OR;  Service: General;  Laterality: Right;   TRANSTHORACIC ECHOCARDIOGRAM     07/2022 nl LV/RV and valves   US  CAROTID DOPPLER BILATERAL ([facility] HX)     01/2024 no stenosis. Rpt 2 yrs.    MEDICATIONS:  aspirin  EC 81 MG tablet   BD PEN NEEDLE NANO 2ND GEN 32G X 4 MM MISC   clopidogrel  (PLAVIX ) 75 MG tablet   dapagliflozin  propanediol (FARXIGA ) 10 MG TABS tablet   lisinopril  (ZESTRIL ) 10 MG tablet   metFORMIN  (GLUCOPHAGE -XR) 500 MG 24 hr tablet   nitroGLYCERIN  (NITROSTAT ) 0.4 MG SL tablet   Omega-3 Fatty Acids (FISH  OIL) 1000 MG CAPS   rosuvastatin  (CRESTOR ) 20 MG tablet   tirzepatide  (MOUNJARO ) 2.5 MG/0.5ML Pen   TRESIBA  FLEXTOUCH 100 UNIT/ML FlexTouch Pen   No current facility-administered medications for this encounter.     Harlene Hoots Ward, PA-C [facility] [phone]

## 2024-04-26 ENCOUNTER — Ambulatory Visit (HOSPITAL_COMMUNITY): Payer: Self-pay | Admitting: Anesthesiology

## 2024-04-26 ENCOUNTER — Encounter (HOSPITAL_COMMUNITY)
Admission: RE | Disposition: A | Discharge status: Home / Self Care | Payer: Self-pay | Source: Home / Self Care | Attending: Surgery

## 2024-04-26 ENCOUNTER — Ambulatory Visit (HOSPITAL_COMMUNITY): Payer: Self-pay | Admitting: Physician Assistant

## 2024-04-26 ENCOUNTER — Other Ambulatory Visit: Payer: Self-pay

## 2024-04-26 ENCOUNTER — Ambulatory Visit (HOSPITAL_COMMUNITY)
Admission: RE | Admit: 2024-04-26 | Discharge: 2024-04-26 | Disposition: A | Discharge status: Home / Self Care | Attending: Surgery | Admitting: Surgery

## 2024-04-26 ENCOUNTER — Encounter (HOSPITAL_COMMUNITY): Payer: Self-pay | Admitting: Surgery

## 2024-04-26 DIAGNOSIS — Z794 Long term (current) use of insulin: Secondary | ICD-10-CM | POA: Diagnosis not present

## 2024-04-26 DIAGNOSIS — I69398 Other sequelae of cerebral infarction: Secondary | ICD-10-CM | POA: Diagnosis not present

## 2024-04-26 DIAGNOSIS — I10 Essential (primary) hypertension: Secondary | ICD-10-CM | POA: Insufficient documentation

## 2024-04-26 DIAGNOSIS — Z7902 Long term (current) use of antithrombotics/antiplatelets: Secondary | ICD-10-CM | POA: Insufficient documentation

## 2024-04-26 DIAGNOSIS — E119 Type 2 diabetes mellitus without complications: Secondary | ICD-10-CM | POA: Insufficient documentation

## 2024-04-26 DIAGNOSIS — Z79899 Other long term (current) drug therapy: Secondary | ICD-10-CM | POA: Diagnosis not present

## 2024-04-26 DIAGNOSIS — Z9049 Acquired absence of other specified parts of digestive tract: Secondary | ICD-10-CM | POA: Diagnosis not present

## 2024-04-26 DIAGNOSIS — Z7984 Long term (current) use of oral hypoglycemic drugs: Secondary | ICD-10-CM | POA: Diagnosis not present

## 2024-04-26 DIAGNOSIS — K420 Umbilical hernia with obstruction, without gangrene: Secondary | ICD-10-CM | POA: Diagnosis not present

## 2024-04-26 DIAGNOSIS — Z7985 Long-term (current) use of injectable non-insulin antidiabetic drugs: Secondary | ICD-10-CM | POA: Diagnosis not present

## 2024-04-26 DIAGNOSIS — Z955 Presence of coronary angioplasty implant and graft: Secondary | ICD-10-CM | POA: Diagnosis not present

## 2024-04-26 DIAGNOSIS — E785 Hyperlipidemia, unspecified: Secondary | ICD-10-CM | POA: Insufficient documentation

## 2024-04-26 DIAGNOSIS — R208 Other disturbances of skin sensation: Secondary | ICD-10-CM | POA: Insufficient documentation

## 2024-04-26 DIAGNOSIS — Z7982 Long term (current) use of aspirin: Secondary | ICD-10-CM | POA: Diagnosis not present

## 2024-04-26 DIAGNOSIS — Z981 Arthrodesis status: Secondary | ICD-10-CM | POA: Diagnosis not present

## 2024-04-26 DIAGNOSIS — I2510 Atherosclerotic heart disease of native coronary artery without angina pectoris: Secondary | ICD-10-CM | POA: Insufficient documentation

## 2024-04-26 HISTORY — PX: UMBILICAL HERNIA REPAIR: SHX196

## 2024-04-26 LAB — GLUCOSE, CAPILLARY
Glucose-Capillary: 179 mg/dL — ABNORMAL HIGH (ref 70–99)
Glucose-Capillary: 204 mg/dL — ABNORMAL HIGH (ref 70–99)
Glucose-Capillary: 178 mg/dL — ABNORMAL HIGH (ref 70–99)

## 2024-04-26 SURGERY — REPAIR, HERNIA, UMBILICAL, ADULT
Anesthesia: General

## 2024-04-26 MED ORDER — FENTANYL CITRATE (PF) 100 MCG/2ML IJ SOLN
INTRAMUSCULAR | Status: AC
  Filled 2024-04-26: qty 2

## 2024-04-26 MED ORDER — FENTANYL CITRATE PF 50 MCG/ML IJ SOSY
PREFILLED_SYRINGE | INTRAMUSCULAR | Status: AC
Start: 2024-04-26 — End: 2024-04-26
  Filled 2024-04-26: qty 1

## 2024-04-26 MED ORDER — BUPIVACAINE HCL (PF) 0.25 % IJ SOLN
INTRAMUSCULAR | Status: AC
  Filled 2024-04-26: qty 30

## 2024-04-26 MED ORDER — LIDOCAINE HCL (PF) 2 % IJ SOLN
INTRAMUSCULAR | Status: AC
  Filled 2024-04-26: qty 5

## 2024-04-26 MED ORDER — INSULIN ASPART 100 UNIT/ML IJ SOLN
0.0000 [IU] | INTRAMUSCULAR | Status: AC | PRN
  Administered 2024-04-26: 4 [IU] via SUBCUTANEOUS
  Administered 2024-04-26: 2 [IU] via SUBCUTANEOUS
  Filled 2024-04-26: qty 1

## 2024-04-26 MED ORDER — SUGAMMADEX SODIUM 200 MG/2ML IV SOLN
INTRAVENOUS | Status: DC | PRN
  Administered 2024-04-26: 200 mg via INTRAVENOUS

## 2024-04-26 MED ORDER — OXYCODONE HCL 5 MG PO TABS: 5.0000 mg | ORAL_TABLET | Freq: Once | ORAL | Status: DC | PRN

## 2024-04-26 MED ORDER — PROPOFOL 10 MG/ML IV BOLUS
INTRAVENOUS | Status: DC | PRN
  Administered 2024-04-26: 200 mg via INTRAVENOUS

## 2024-04-26 MED ORDER — ONDANSETRON HCL 4 MG/2ML IJ SOLN
INTRAMUSCULAR | Status: AC
  Filled 2024-04-26: qty 2

## 2024-04-26 MED ORDER — ROCURONIUM BROMIDE 10 MG/ML (PF) SYRINGE
PREFILLED_SYRINGE | INTRAVENOUS | Status: AC
  Filled 2024-04-26: qty 10

## 2024-04-26 MED ORDER — LIDOCAINE HCL (PF) 2 % IJ SOLN
INTRAMUSCULAR | Status: DC | PRN
  Administered 2024-04-26: 100 mg via INTRADERMAL

## 2024-04-26 MED ORDER — FENTANYL CITRATE PF 50 MCG/ML IJ SOSY
25.0000 ug | PREFILLED_SYRINGE | INTRAMUSCULAR | Status: DC | PRN
  Administered 2024-04-26 (×2): 50 ug via INTRAVENOUS

## 2024-04-26 MED ORDER — ROCURONIUM BROMIDE 10 MG/ML (PF) SYRINGE
PREFILLED_SYRINGE | INTRAVENOUS | Status: DC | PRN
  Administered 2024-04-26: 60 mg via INTRAVENOUS
  Administered 2024-04-26: 10 mg via INTRAVENOUS

## 2024-04-26 MED ORDER — ACETAMINOPHEN 500 MG PO TABS
1000.0000 mg | ORAL_TABLET | Freq: Once | ORAL | Status: AC
  Administered 2024-04-26: 1000 mg via ORAL
  Filled 2024-04-26: qty 2

## 2024-04-26 MED ORDER — DEXAMETHASONE SODIUM PHOSPHATE 10 MG/ML IJ SOLN
INTRAMUSCULAR | Status: DC | PRN
  Administered 2024-04-26: 5 mg via INTRAVENOUS

## 2024-04-26 MED ORDER — PROPOFOL 10 MG/ML IV BOLUS
INTRAVENOUS | Status: AC
  Filled 2024-04-26: qty 20

## 2024-04-26 MED ORDER — OXYCODONE HCL 5 MG/5ML PO SOLN: 5.0000 mg | Freq: Once | ORAL | Status: DC | PRN

## 2024-04-26 MED ORDER — TRAMADOL HCL 50 MG PO TABS: 50.0000 mg | ORAL_TABLET | Freq: Four times a day (QID) | ORAL | 0 refills | Status: DC | PRN

## 2024-04-26 MED ORDER — ONDANSETRON HCL 4 MG/2ML IJ SOLN
INTRAMUSCULAR | Status: DC | PRN
  Administered 2024-04-26: 4 mg via INTRAVENOUS

## 2024-04-26 MED ORDER — LACTATED RINGERS IV SOLN: INTRAVENOUS | Status: DC

## 2024-04-26 MED ORDER — MIDAZOLAM HCL 2 MG/2ML IJ SOLN
INTRAMUSCULAR | Status: AC
  Filled 2024-04-26: qty 2

## 2024-04-26 MED ORDER — DEXAMETHASONE SODIUM PHOSPHATE 10 MG/ML IJ SOLN
INTRAMUSCULAR | Status: AC
Start: 2024-04-26 — End: 2024-04-26
  Filled 2024-04-26: qty 1

## 2024-04-26 MED ORDER — CHLORHEXIDINE GLUCONATE CLOTH 2 % EX PADS: 6.0000 | MEDICATED_PAD | Freq: Once | CUTANEOUS | Status: DC

## 2024-04-26 MED ORDER — CEFAZOLIN SODIUM-DEXTROSE 2-4 GM/100ML-% IV SOLN
2.0000 g | INTRAVENOUS | Status: AC
  Administered 2024-04-26: 2 g via INTRAVENOUS
  Filled 2024-04-26: qty 100

## 2024-04-26 MED ORDER — MIDAZOLAM HCL 5 MG/5ML IJ SOLN
INTRAMUSCULAR | Status: DC | PRN
  Administered 2024-04-26: 2 mg via INTRAVENOUS

## 2024-04-26 MED ORDER — AMISULPRIDE (ANTIEMETIC) 5 MG/2ML IV SOLN: 10.0000 mg | Freq: Once | INTRAVENOUS | Status: DC | PRN

## 2024-04-26 MED ORDER — FENTANYL CITRATE (PF) 100 MCG/2ML IJ SOLN
INTRAMUSCULAR | Status: DC | PRN
  Administered 2024-04-26: 100 ug via INTRAVENOUS

## 2024-04-26 MED ORDER — CHLORHEXIDINE GLUCONATE 0.12 % MT SOLN
15.0000 mL | Freq: Once | OROMUCOSAL | Status: AC
Start: 2024-04-26 — End: 2024-04-26
  Administered 2024-04-26: 15 mL via OROMUCOSAL

## 2024-04-26 MED ORDER — ORAL CARE MOUTH RINSE: 15.0000 mL | Freq: Once | OROMUCOSAL | Status: AC

## 2024-04-26 SURGICAL SUPPLY — 26 items
BAG COUNTER SPONGE SURGICOUNT (BAG) IMPLANT
BLADE HEX COATED 2.75 (ELECTRODE) IMPLANT
CHLORAPREP W/TINT 26 (MISCELLANEOUS) ×2 IMPLANT
COVER SURGICAL LIGHT HANDLE (MISCELLANEOUS) ×1 IMPLANT
DERMABOND ADVANCED .7 DNX12 (GAUZE/BANDAGES/DRESSINGS) IMPLANT
DRAPE LAPAROSCOPIC ABDOMINAL (DRAPES) ×1 IMPLANT
ELECT REM PT RETURN 15FT ADLT (MISCELLANEOUS) ×1 IMPLANT
GAUZE SPONGE 4X4 12PLY STRL (GAUZE/BANDAGES/DRESSINGS) IMPLANT
GLOVE SURG ORTHO 8.0 STRL STRW (GLOVE) ×1 IMPLANT
GLOVE SURG SYN 7.5 E (GLOVE) ×1 IMPLANT
GLOVE SURG SYN 7.5 PF PI (GLOVE) ×1 IMPLANT
GOWN STRL REUS W/ TWL XL LVL3 (GOWN DISPOSABLE) ×2 IMPLANT
KIT BASIN OR (CUSTOM PROCEDURE TRAY) ×1 IMPLANT
KIT TURNOVER KIT A (KITS) ×1 IMPLANT
MARKER SKIN DUAL TIP RULER LAB (MISCELLANEOUS) ×1 IMPLANT
MESH VENTRALEX ST 2.5 CRC MED (Mesh General) IMPLANT
NDL HYPO 22X1.5 SAFETY MO (MISCELLANEOUS) ×1 IMPLANT
NEEDLE HYPO 22X1.5 SAFETY MO (MISCELLANEOUS) ×1 IMPLANT
PACK GENERAL/GYN (CUSTOM PROCEDURE TRAY) ×1 IMPLANT
SPIKE FLUID TRANSFER (MISCELLANEOUS) ×1 IMPLANT
STRIP CLOSURE SKIN 1/2X4 (GAUZE/BANDAGES/DRESSINGS) IMPLANT
SUT MNCRL AB 4-0 PS2 18 (SUTURE) ×1 IMPLANT
SUT NOVA NAB GS-21 0 18 T12 DT (SUTURE) IMPLANT
SUT VIC AB 3-0 SH 18 (SUTURE) ×1 IMPLANT
SYR 20ML LL LF (SYRINGE) ×1 IMPLANT
TOWEL OR 17X26 10 PK STRL BLUE (TOWEL DISPOSABLE) ×1 IMPLANT

## 2024-04-26 NOTE — Anesthesia Postprocedure Evaluation (Signed)
 Anesthesia Post Note  Patient: Eddie Garner  Procedure(s) Performed: REPAIR, HERNIA, UMBILICAL, ADULT     Patient location during evaluation: PACU Anesthesia Type: General Level of consciousness: awake Pain management: pain level controlled Vital Signs Assessment: post-procedure vital signs reviewed and stable Respiratory status: spontaneous breathing, nonlabored ventilation and respiratory function stable Cardiovascular status: blood pressure returned to baseline and stable Postop Assessment: no apparent nausea or vomiting Anesthetic complications: no   No notable events documented.  Last Vitals:  Vitals:   04/26/24 1109 04/26/24 1115  BP: (!) 166/90   Pulse: (!) 55 (!) 58  Resp: 11 14  Temp:    SpO2: 96% 96%    Last Pain:  Vitals:   04/26/24 1115  TempSrc:   PainSc: 5                  Delon Aisha Arch

## 2024-04-26 NOTE — Transfer of Care (Signed)
 Immediate Anesthesia Transfer of Care Note  Patient: Jerona LULLA Passy  Procedure(s) Performed: REPAIR, HERNIA, UMBILICAL, ADULT  Patient Location: PACU  Anesthesia Type:General  Level of Consciousness: sedated  Airway & Oxygen Therapy: Patient Spontanous Breathing and Patient connected to face mask oxygen  Post-op Assessment: Report given to RN and Post -op Vital signs reviewed and stable  Post vital signs: Reviewed and stable  Last Vitals:  Vitals Value Taken Time  BP    Temp    Pulse 60 04/26/24 10:18  Resp 14 04/26/24 10:18  SpO2 100 % 04/26/24 10:18  Vitals shown include unfiled device data.  Last Pain:  Vitals:   04/26/24 0656  TempSrc:   PainSc: 0-No pain         Complications: No notable events documented.

## 2024-04-26 NOTE — Anesthesia Procedure Notes (Signed)
 Procedure Name: Intubation Date/Time: 04/26/2024 8:56 AM  Performed by: Carleton Garnette SAUNDERS, CRNAPre-anesthesia Checklist: Patient identified, Emergency Drugs available, Suction available, Patient being monitored and Timeout performed Patient Re-evaluated:Patient Re-evaluated prior to induction Oxygen Delivery Method: Circle system utilized Preoxygenation: Pre-oxygenation with 100% oxygen Induction Type: IV induction Ventilation: Mask ventilation without difficulty Laryngoscope Size: Mac and 4 Grade View: Grade II Tube type: Oral Tube size: 7.5 mm Number of attempts: 1 Airway Equipment and Method: Stylet Placement Confirmation: ETT inserted through vocal cords under direct vision, positive ETCO2 and breath sounds checked- equal and bilateral Secured at: 23 cm Tube secured with: Tape Dental Injury: Teeth and Oropharynx as per pre-operative assessment

## 2024-04-26 NOTE — Discharge Instructions (Addendum)
[facility]  HERNIA REPAIR POST OP INSTRUCTIONS  Always review your discharge instruction sheet given to you by the facility where your surgery was performed.  A  prescription for pain medication may be sent to your pharmacy on discharge.  Take your pain medication as prescribed.  If narcotic pain medicine is not needed, then you may take acetaminophen (Tylenol) or ibuprofen (Advil) as needed.  Take your usually prescribed medications unless otherwise directed.  If you need a refill on your pain medication, please contact your pharmacy.  They will contact our office to request authorization. Prescriptions will not be filled after 5:00 PM daily or on weekends.  You should follow a light diet the first 24 hours after arrival home, such as soup and crackers or toast.  Be sure to include plenty of fluids daily.  Resume your normal diet the day after surgery.  Most patients will experience some swelling and bruising around the surgical site.  Ice packs and reclining will help.  Swelling and bruising can take several days to resolve.   It is common to experience some constipation if taking pain medication after surgery.  Increasing fluid intake and taking a stool softener (such as Colace) will usually help or prevent this problem from occurring.  A mild laxative (Milk of Magnesia or Miralax) should be taken according to package directions if there is no bowel movement after 48 hours.  You will likely have Dermabond (topical glue) over your incisions.  This seals the incisions and allows you to bathe and shower at any time after your surgery.  Glue should remain in place for up to 10 days.  It may be removed after 10 days by pealing off the Dermabond material or using Vaseline or naval jelly to remove.  ACTIVITIES:  You may resume regular (light) daily activities beginning the next day - such as daily self-care, walking, climbing stairs - gradually increasing activities as tolerated.  You  may have sexual intercourse when it is comfortable.  Refrain from any heavy lifting or straining until approved by your doctor.  You may drive when you are no longer taking prescription pain medication, when you can comfortably wear a seatbelt, and when you can safely maneuver your car and apply the brakes.  You should see your doctor in the office for a follow-up appointment approximately 2-3 weeks after your surgery.  Make sure that you call for this appointment within a day or two after you arrive home to insure a convenient appointment time.  WHEN TO CALL YOUR DOCTOR: Fever greater than 101.0 Inability to urinate Persistent nausea and/or vomiting Extreme swelling or bruising Continued bleeding from incision Increased pain, redness, or drainage from the incision  The clinic staff is available to answer your questions during regular business hours.  Please don't hesitate to call and ask to speak to one of the nurses for clinical concerns.  If you have a medical emergency, go to the nearest emergency room or call 911.  A surgeon from [facility] is always on call for the hospital.   [facility] [address], [address], [place], [place]  [address]  [phone] ? [phone] ? FAX [phone] 

## 2024-04-26 NOTE — Interval H&P Note (Signed)
 History and Physical Interval Note:  04/26/2024 8:34 AM  Eddie Garner  has presented today for surgery, with the diagnosis of INCARCERATED UMBILICAL HERNIA.  The various methods of treatment have been discussed with the patient and family. After consideration of risks, benefits and other options for treatment, the patient has consented to    Procedure(s) with comments: REPAIR, HERNIA, UMBILICAL, ADULT (N/A) - OPEN REPAIR UMBILICAL HERNIA WITH MESH PATCH as a surgical intervention.    The patient's history has been reviewed, patient examined, no change in status, stable for surgery.  I have reviewed the patient's chart and labs.  Questions were answered to the patient's satisfaction.    Krystal Spinner, MD [facility] practice Office: [phone]   Krystal Spinner

## 2024-04-26 NOTE — Op Note (Addendum)
 Umbilical Hernia, Open, Procedure Note  Pre-operative Diagnosis: Umbilical hernia, incarcerated  Post-operative Diagnosis: same  Procedure: Open repair of incarcerated umbilical hernia with Ventralex ST mesh patch (6.4 cm)  Teaching Surgeon:  Krystal Spinner, MD  Resident Surgeon:  Mae Flock, MD ([facility] Resident)  Anesthesia:  General  Preparation:  Chlora-prep  Estimated Blood Loss: minimal  Complications:  none  I was personally present during the key and critical portions of this procedure and immediately available throughout the entire procedure, as documented in my operative note.  Indications: Patient is referred by his primary care physician, Dr. Aleene Hockey, for surgical evaluation and management of an incarcerated umbilical hernia. Patient states that this has been present for several years. It is gradually increased in size. It does not cause significant discomfort. Patient has had no signs or symptoms of obstruction.   Procedure Details  The patient was brought to operating room and placed in a supine position on the operating room table.  Following induction of general anesthesia, the patient was prepped and draped in the usual aseptic fashion.  After ascertaining that an adequate level of anesthesia been achieved, an infraumbilical incision was made transversely with a #15 blade.  Dissection was carried through the subcutaneous tissues and the hernia sac was identified.  Hernia sac was dissected off the posterior aspect of the umbilical skin and the entire sac was dissected out down to the fascial defect.  Umbilicus was completely elevated off of the abdominal wall.  Hernia sac was excised and the hernia was reduced.  Preperitoneal space was developed with gentle blunt dissection.  A Ventralex ST 6.4 cm mesh patch was selected for the repair.  The mesh patch was placed into the preperitoneal space and deployed circumferentially.  It was secured to the overlying fascia with  the closure of the fascia with interrupted 0-Novafil simple sutures.  Local field block was placed with Marcaine .  Umbilicus was affixed to the abdominal wall with an interrupted 0-Novofil suture.  Subcutaneous tissues were closed with interrupted 3-0 Vicryl sutures.  Skin edges were approximated with a running 4-0 Monocryl subcuticular suture.  Wound was washed and dried and Dermabond applied.  The patient was awakened from anesthesia and brought to the recovery room.  The patient tolerated the procedure well.   Krystal Spinner, MD [facility] Surgery Office: [phone]

## 2024-04-29 ENCOUNTER — Encounter (HOSPITAL_COMMUNITY): Payer: Self-pay | Admitting: Surgery

## 2024-05-05 ENCOUNTER — Other Ambulatory Visit: Payer: Self-pay | Admitting: Family Medicine

## 2024-05-06 ENCOUNTER — Other Ambulatory Visit: Payer: Self-pay

## 2024-05-07 ENCOUNTER — Other Ambulatory Visit: Payer: Self-pay | Admitting: Family Medicine

## 2024-05-16 ENCOUNTER — Ambulatory Visit: Admitting: Family Medicine

## 2024-05-16 VITALS — BP 138/90 | HR 66 | Wt 240.6 lb

## 2024-05-16 DIAGNOSIS — Z794 Long term (current) use of insulin: Secondary | ICD-10-CM

## 2024-05-16 DIAGNOSIS — E7800 Pure hypercholesterolemia, unspecified: Secondary | ICD-10-CM

## 2024-05-16 DIAGNOSIS — I10 Essential (primary) hypertension: Secondary | ICD-10-CM

## 2024-05-16 DIAGNOSIS — E119 Type 2 diabetes mellitus without complications: Secondary | ICD-10-CM | POA: Diagnosis not present

## 2024-05-16 LAB — POCT GLYCOSYLATED HEMOGLOBIN (HGB A1C)
HbA1c POC (<> result, manual entry): 8.1 % (ref 4.0–5.6)
HbA1c, POC (controlled diabetic range): 8.1 % — AB (ref 0.0–7.0)
HbA1c, POC (prediabetic range): 8.1 % — AB (ref 5.7–6.4)
Hemoglobin A1C: 8.1 % — AB (ref 4.0–5.6)

## 2024-05-16 MED ORDER — TIRZEPATIDE 5 MG/0.5ML ~~LOC~~ SOAJ
5.0000 mg | SUBCUTANEOUS | 0 refills | Status: DC
Start: 2024-05-16 — End: 2024-09-02

## 2024-05-16 MED ORDER — METFORMIN HCL ER 500 MG PO TB24: ORAL_TABLET | ORAL | 3 refills | Status: AC

## 2024-05-16 MED ORDER — LISINOPRIL 10 MG PO TABS: 10.0000 mg | ORAL_TABLET | Freq: Every day | ORAL | 3 refills | Status: AC

## 2024-05-16 MED ORDER — DAPAGLIFLOZIN PROPANEDIOL 10 MG PO TABS: 10.0000 mg | ORAL_TABLET | Freq: Every day | ORAL | 3 refills | Status: AC

## 2024-05-16 NOTE — Patient Instructions (Signed)
 Send a mychart message in about 3 weeks to request mounjaro -->let me know at that time if you want to increase the dose to 7.5mg  per week.

## 2024-05-16 NOTE — Progress Notes (Signed)
 OFFICE VISIT  05/16/2024  CC:  Chief Complaint  Patient presents with   Medical Management of Chronic Issues    3 month RCI. Pt does need refills today. He has been out of lisinopril  and mounjaro .    Patient is a 63 y.o. male who presents for 44-month follow-up diabetes, hypertension, and hypercholesterolemia. A/P as of last visit: #1 diabetes without complication. Control still poor but improved--> hemoglobin A1c today is 8.8%, down from 11.1%. Will get him back on all of his medications: Farxiga  10 mg a day, Tresiba  50 units today, metformin  XR 1000 mg twice a day, and we will see if his current insurance will cover Mounjaro  2.5 mg weekly.   #2 hypertension, well-controlled on lisinopril  10 mg a day. Check electrolytes and creatinine today.   3.  Hypercholesterolemia.  Doing well on Crestor  20 mg a day. Has been out of this for about 6 weeks. Lipid panel today.  INTERIM HX: He feels well. He got umbilical hernia repair 3 weeks ago.  He is pretty sore still.  He occasionally checks home glucose, says usually around 130 if he is active and eating appropriately.  Sometimes goes up to 160 or so if not eating strictly. He is on 50 units of Tresiba  daily as well as 1000 mg of metformin  twice a day and 10 mg Farxiga  daily. He was on Mounjaro  2.5 mg weekly for a month but was unable to get refills. He tolerated this medication without problem.  Home blood pressures are consistently less than 130/80.  The de Quervain's injection I did on his right wrist 2 months ago helped for about 1 week.  The symptoms are significant still but he wants to hold off on doing anything different right now.  ROS as above, plus--> no fevers, no CP, no SOB, no wheezing, no cough, no dizziness, no HAs, no rashes, no melena/hematochezia.  No polyuria or polydipsia.  No myalgias  No focal weakness, paresthesias, or tremors.  No acute vision or hearing abnormalities.  No dysuria or unusual/new urinary urgency  or frequency.  No recent changes in lower legs. No n/v/d or abd pain.  No palpitations.     Past Medical History:  Diagnosis Date   Coronary artery disease    CVA (cerebral vascular accident) (HCC)    2023.  Residual dysesthesia R side of torso and R leg   Diabetes (HCC)    History of pneumonia    HTN (hypertension)    Hypercholesteremia    Obesity    Right thyroid  nodule    Umbilical hernia     Past Surgical History:  Procedure Laterality Date   ANTERIOR CERVICAL DECOMP/DISCECTOMY FUSION N/A 05/05/2021   Procedure: ANTERIOR CERVICAL DECOMPRESSION/DISCECTOMY FUSION 3 LEVELS C4-7;  Surgeon: Burnetta Aures, MD;  Location: [facility] OR;  Service: Orthopedics;  Laterality: N/A;  3.5 hrs   APPENDECTOMY     CARDIAC CATHETERIZATION     CARDIOVASCULAR STRESS TEST     2014 ETT neg, 2017 nuclear neg   COLONOSCOPY     CORONARY ANGIOPLASTY     DES LAD 10/28/08 (Dr. Dann, [facility])   ELBOW SURGERY Left 07/09/2018   Dr. Shari   FOOT SURGERY     HAND SURGERY Bilateral 07/09/2018   Dr. Shari    knee surgery     SHOULDER ARTHROSCOPY Left 01/19/2022   Procedure: Left shoulder arthroscopy, manipulation under anesthesia, evaluation under anesthesia, debridement;  Surgeon: Duwayne Purchase, MD;  Location: [facility] ORS;  Service: Orthopedics;  Laterality: Left;  Regional block   SHOULDER ARTHROSCOPY WITH ROTATOR CUFF REPAIR AND SUBACROMIAL DECOMPRESSION Left 04/16/2020   Procedure: SHOULDER ARTHROSCOPY WITH MINI OPEN ROTATOR CUFF REPAIR AND SUBACROMIAL DECOMPRESSION;  Surgeon: Duwayne Purchase, MD;  Location: [facility] ORS;  Service: Orthopedics;  Laterality: Left;  90 MINS   SHOULDER SURGERY Right 03/05/2018   Dr. Kay.    THYROID  LOBECTOMY Right 11/19/2018   Procedure: RIGHT THYROID  LOBECTOMY;  Surgeon: Rubin Calamity, MD;  Location: [facility] OR;  Service: General;  Laterality: Right;   TRANSTHORACIC ECHOCARDIOGRAM     07/2022 nl LV/RV and valves   UMBILICAL HERNIA REPAIR N/A 04/26/2024   Procedure: REPAIR,  HERNIA, UMBILICAL, ADULT;  Surgeon: Eletha Boas, MD;  Location: [facility] ORS;  Service: General;  Laterality: N/A;  OPEN REPAIR UMBILICAL HERNIA WITH MESH PATCH   US  CAROTID DOPPLER BILATERAL ([facility] HX)     01/2024 no stenosis. Rpt 2 yrs.    Outpatient Medications Prior to Visit  Medication Sig Dispense Refill   aspirin  EC 81 MG tablet Take 1 tablet (81 mg total) by mouth daily. Day after surgery 30 tablet 1   BD PEN NEEDLE NANO 2ND GEN 32G X 4 MM MISC USE ONCE DAILY AS INSTRUCTED     clopidogrel  (PLAVIX ) 75 MG tablet TAKE 1 TABLET BY MOUTH EVERY DAY WITH BREAKFAST 90 tablet 3   nitroGLYCERIN  (NITROSTAT ) 0.4 MG SL tablet Place 1 tablet (0.4 mg total) under the tongue every 5 (five) minutes as needed for chest pain. 25 tablet 6   Omega-3 Fatty Acids (FISH OIL) 1000 MG CAPS Take 1,000 mg by mouth daily.     rosuvastatin  (CRESTOR ) 20 MG tablet Take 1 tablet (20 mg total) by mouth daily. 90 tablet 3   traMADol  (ULTRAM ) 50 MG tablet Take 1-2 tablets (50-100 mg total) by mouth every 6 (six) hours as needed for moderate pain (pain score 4-6). 15 tablet 0   TRESIBA  FLEXTOUCH 100 UNIT/ML FlexTouch Pen Inject 50 Units into the skin daily. 15 mL 0   dapagliflozin  propanediol (FARXIGA ) 10 MG TABS tablet TAKE 1 TABLET(10 MG) BY MOUTH DAILY WITH LUNCH 30 tablet 1   lisinopril  (ZESTRIL ) 10 MG tablet TAKE 1 TABLET(10 MG) BY MOUTH DAILY 90 tablet 0   metFORMIN  (GLUCOPHAGE -XR) 500 MG 24 hr tablet TAKE 2 TABLETS(1000 MG) BY MOUTH TWICE DAILY 360 tablet 0   tirzepatide  (MOUNJARO ) 2.5 MG/0.5ML Pen Inject 2.5 mg into the skin once a week. (Patient not taking: Reported on 05/16/2024) 2 mL 0   No facility-administered medications prior to visit.    No Known Allergies  Review of Systems As per HPI  PE:    05/16/2024    2:07 PM 05/16/2024    1:57 PM 04/26/2024   11:30 AM  Vitals with BMI  Weight  240 lbs 10 oz   BMI  34.02   Systolic 138 149 823  Diastolic 90 88 85  Pulse  66 62   Physical Exam  Gen: Alert,  well appearing.  Patient is oriented to person, place, time, and situation. AFFECT: pleasant, lucid thought and speech. No further exam today  LABS:  Last CBC Lab Results  Component Value Date   WBC 7.3 09/01/2023   HGB 13.4 09/01/2023   HCT 42.0 09/01/2023   MCV 92.6 09/01/2023   MCH 31.1 01/10/2022   RDW 13.0 09/01/2023   PLT 252.0 09/01/2023   Last metabolic panel Lab Results  Component Value Date   GLUCOSE 163 (H) 04/23/2024   NA 141 04/23/2024  K 4.3 04/23/2024   CL 107 04/23/2024   CO2 24 04/23/2024   BUN 19 04/23/2024   CREATININE 0.89 04/23/2024   GFRNONAA >60 04/23/2024   CALCIUM  8.7 (L) 04/23/2024   PROT 6.6 09/01/2023   ALBUMIN 4.3 09/01/2023   BILITOT 0.7 09/01/2023   ALKPHOS 94 09/01/2023   AST 17 09/01/2023   ALT 22 09/01/2023   ANIONGAP 10 04/23/2024   Last lipids Lab Results  Component Value Date   CHOL 99 02/15/2024   HDL 26 (L) 02/15/2024   LDLCALC 47 02/15/2024   TRIG 190 (H) 02/15/2024   CHOLHDL 3.8 02/15/2024   Last hemoglobin A1c Lab Results  Component Value Date   HGBA1C 8.1 (A) 05/16/2024   HGBA1C 8.1 05/16/2024   HGBA1C 8.1 (A) 05/16/2024   HGBA1C 8.1 (A) 05/16/2024   Lab Results  Component Value Date   PSA 1.51 09/01/2023   IMPRESSION AND PLAN:  #1 diabetes without complication. Control is slightly improved--> hemoglobin A1c today is 8.1%, down from 8.8% three mo ago (down from 11.1% Nov 2024). Continue Farxiga  10 mg a day, Tresiba  50 units today, metformin  XR 1000 mg twice a day, and increase Mounjaro  to 5 mg weekly. He will continue to try to work on stricter diet and increased exercise.  #2 hypertension, well-controlled on lisinopril  10 mg a day. Basic metabolic panel normal approximately 1 month ago.   3.  Hypercholesterolemia.  Doing well on Crestor  20 mg a day. LDL was 47 approximately 3 months ago. Plan lipids in 4 months.  #4 de Quervain's tenosynovitis, right wrist. Steroid injection a couple of months ago  gave only about a week of relief. He wants to hold off on any further steps for this problem at this time.  An After Visit Summary was printed and given to the patient.  FOLLOW UP: Return in about 4 months (around 09/16/2024) for annual CPE (fasting). Next cpe 08/2024 Signed:  Gerlene Hockey, MD           05/16/2024

## 2024-06-05 ENCOUNTER — Other Ambulatory Visit: Payer: Self-pay | Admitting: Family Medicine

## 2024-08-04 ENCOUNTER — Other Ambulatory Visit: Payer: Self-pay | Admitting: Family Medicine

## 2024-09-02 ENCOUNTER — Telehealth: Payer: Self-pay

## 2024-09-02 MED ORDER — TIRZEPATIDE 5 MG/0.5ML ~~LOC~~ SOAJ: 5.0000 mg | SUBCUTANEOUS | 0 refills | Status: DC

## 2024-09-02 NOTE — Telephone Encounter (Signed)
 Patient walked into office because he is having trouble with mychart, would not let him log on. I gave him number to mychart to further assist with him logging on. System is showing his last log on was 09/01/24. He is requesting refill and inquiring when his next appt, gave him appt info 11/13 at 8:40am with Dr. Candise.  Prescription Request  09/02/2024  LOV: Visit date not found  What is the name of the medication or equipment? tirzepatide  (MOUNJARO ) 5 MG/0.5ML Pen   Have you contacted your pharmacy to request a refill? No   Which pharmacy would you like this sent to?  [id number] - [place], [place] - [address] [place] [address] Phone: [phone] Fax: [phone]    Patient notified that their request is being sent to the clinical staff for review and that they should receive a response within 2 business days.   Please advise at Mobile [phone] (mobile)

## 2024-09-02 NOTE — Telephone Encounter (Signed)
 Rx sent for 1 month

## 2024-09-12 ENCOUNTER — Encounter: Payer: Self-pay | Admitting: Family Medicine

## 2024-09-12 ENCOUNTER — Ambulatory Visit: Admitting: Family Medicine

## 2024-09-12 VITALS — BP 131/80 | HR 60 | Temp 97.9°F | Ht 70.75 in | Wt 241.0 lb

## 2024-09-12 DIAGNOSIS — M654 Radial styloid tenosynovitis [de Quervain]: Secondary | ICD-10-CM

## 2024-09-12 DIAGNOSIS — Z7985 Long-term (current) use of injectable non-insulin antidiabetic drugs: Secondary | ICD-10-CM

## 2024-09-12 DIAGNOSIS — E7800 Pure hypercholesterolemia, unspecified: Secondary | ICD-10-CM

## 2024-09-12 DIAGNOSIS — Z794 Long term (current) use of insulin: Secondary | ICD-10-CM | POA: Diagnosis not present

## 2024-09-12 DIAGNOSIS — Z7984 Long term (current) use of oral hypoglycemic drugs: Secondary | ICD-10-CM

## 2024-09-12 DIAGNOSIS — Z0000 Encounter for general adult medical examination without abnormal findings: Secondary | ICD-10-CM | POA: Diagnosis not present

## 2024-09-12 DIAGNOSIS — I10 Essential (primary) hypertension: Secondary | ICD-10-CM

## 2024-09-12 DIAGNOSIS — Z125 Encounter for screening for malignant neoplasm of prostate: Secondary | ICD-10-CM

## 2024-09-12 DIAGNOSIS — E119 Type 2 diabetes mellitus without complications: Secondary | ICD-10-CM | POA: Diagnosis not present

## 2024-09-12 DIAGNOSIS — M25531 Pain in right wrist: Secondary | ICD-10-CM

## 2024-09-12 LAB — CBC WITH DIFFERENTIAL/PLATELET
Basophils Absolute: 0 K/uL (ref 0.0–0.1)
Basophils Relative: 0.4 % (ref 0.0–3.0)
Eosinophils Absolute: 0.2 K/uL (ref 0.0–0.7)
Eosinophils Relative: 2.3 % (ref 0.0–5.0)
HCT: 44.2 % (ref 39.0–52.0)
Hemoglobin: 15 g/dL (ref 13.0–17.0)
Lymphocytes Relative: 17.5 % (ref 12.0–46.0)
Lymphs Abs: 1.3 K/uL (ref 0.7–4.0)
MCHC: 33.9 g/dL (ref 30.0–36.0)
MCV: 90.4 fl (ref 78.0–100.0)
Monocytes Absolute: 0.7 K/uL (ref 0.1–1.0)
Monocytes Relative: 8.7 % (ref 3.0–12.0)
Neutro Abs: 5.3 K/uL (ref 1.4–7.7)
Neutrophils Relative %: 71.1 % (ref 43.0–77.0)
Platelets: 255 K/uL (ref 150.0–400.0)
RBC: 4.89 Mil/uL (ref 4.22–5.81)
RDW: 13.5 % (ref 11.5–15.5)
WBC: 7.5 K/uL (ref 4.0–10.5)

## 2024-09-12 LAB — COMPREHENSIVE METABOLIC PANEL WITH GFR
ALT: 19 U/L (ref 0–53)
AST: 17 U/L (ref 0–37)
Albumin: 4.8 g/dL (ref 3.5–5.2)
Alkaline Phosphatase: 79 U/L (ref 39–117)
BUN: 23 mg/dL (ref 6–23)
CO2: 30 meq/L (ref 19–32)
Calcium: 9.2 mg/dL (ref 8.4–10.5)
Chloride: 104 meq/L (ref 96–112)
Creatinine, Ser: 1.03 mg/dL (ref 0.40–1.50)
GFR: 77.01 mL/min (ref >60.00)
Glucose, Bld: 131 mg/dL — ABNORMAL HIGH (ref 70–99)
Potassium: 4.8 meq/L (ref 3.5–5.1)
Sodium: 142 meq/L (ref 135–145)
Total Bilirubin: 0.6 mg/dL (ref 0.2–1.2)
Total Protein: 7.2 g/dL (ref 6.0–8.3)

## 2024-09-12 LAB — LIPID PANEL
Cholesterol: 93 mg/dL (ref 0–200)
HDL: 28.2 mg/dL — ABNORMAL LOW (ref >39.00)
LDL Cholesterol: 48 mg/dL (ref 0–99)
NonHDL: 64.66
Total CHOL/HDL Ratio: 3
Triglycerides: 84 mg/dL (ref 0.0–149.0)
VLDL: 16.8 mg/dL (ref 0.0–40.0)

## 2024-09-12 LAB — POCT GLYCOSYLATED HEMOGLOBIN (HGB A1C)
HbA1c POC (<> result, manual entry): 7.6 % (ref 4.0–5.6)
HbA1c, POC (controlled diabetic range): 7.6 % — AB (ref 0.0–7.0)
HbA1c, POC (prediabetic range): 7.6 % — AB (ref 5.7–6.4)
Hemoglobin A1C: 7.6 % — AB (ref 4.0–5.6)

## 2024-09-12 LAB — MICROALBUMIN / CREATININE URINE RATIO
Creatinine,U: 87.2 mg/dL
Microalb Creat Ratio: 27.6 mg/g (ref 0.0–30.0)
Microalb, Ur: 2.4 mg/dL — ABNORMAL HIGH (ref 0.0–1.9)

## 2024-09-12 LAB — PSA: PSA: 1.27 ng/mL (ref 0.10–4.00)

## 2024-09-12 MED ORDER — TIRZEPATIDE 2.5 MG/0.5ML ~~LOC~~ SOAJ: 2.5000 mg | SUBCUTANEOUS | 5 refills | Status: AC

## 2024-09-12 NOTE — Progress Notes (Signed)
 Office Note 09/12/2024  CC:  Chief Complaint  Patient presents with   Annual Exam    Pt is fasting   Patient is a 63 y.o. male who is here for annual health maintenance exam and 25-month follow-up diabetes, hypertension, and hypercholesterolemia. A/P as of last visit: 1 diabetes without complication. Control is slightly improved--> hemoglobin A1c today is 8.1%, down from 8.8% three mo ago (down from 11.1% Nov 2024). Continue Farxiga  10 mg a day, Tresiba  50 units today, metformin  XR 1000 mg twice a day, and increase Mounjaro  to 5 mg weekly. He will continue to try to work on stricter diet and increased exercise.   #2 hypertension, well-controlled on lisinopril  10 mg a day. Basic metabolic panel normal approximately 1 month ago.   3.  Hypercholesterolemia.  Doing well on Crestor  20 mg a day. LDL was 47 approximately 3 months ago. Plan lipids in 4 months.   #4 de Quervain's tenosynovitis, right wrist. Steroid injection a couple of months ago gave only about a week of relief. He wants to hold off on any further steps for this problem at this time.  INTERIM HX: Eddie Garner is feeling well other than some chronic right wrist and thumb pain.  He used to be a copywriter, advertising for the [employer]  Squeezes hands all the time, particular the right hand. He has had pain in the radial aspect of the right wrist at the base of the thumb for about 8 months.  I did a first dorsal wrist compartment steroid injection 03/13/2024 and it only helped for a couple of days.  Progressive pain over the last month.  Denies tingling or numbness in the hand.  Left wrist/hand does not bother him at all.  He only occasionally checks his glucose at home. Diet has not been as good as when he was on Mounjaro .  He tolerated the 2.5 mg weekly dose but not the 5 mg dose--> says it tore his stomach up. He has not been on this medication for over a month due to some pharmacy/refill problems.  Past Medical History:   Diagnosis Date   Coronary artery disease    CVA (cerebral vascular accident) (HCC)    2023.  Residual dysesthesia R side of torso and R leg   Diabetes (HCC)    History of pneumonia    HTN (hypertension)    Hypercholesteremia    Obesity    Right thyroid  nodule    Umbilical hernia     Past Surgical History:  Procedure Laterality Date   ANTERIOR CERVICAL DECOMP/DISCECTOMY FUSION N/A 05/05/2021   Procedure: ANTERIOR CERVICAL DECOMPRESSION/DISCECTOMY FUSION 3 LEVELS C4-7;  Surgeon: Burnetta Aures, MD;  Location: [facility] OR;  Service: Orthopedics;  Laterality: N/A;  3.5 hrs   APPENDECTOMY     CARDIAC CATHETERIZATION     CARDIOVASCULAR STRESS TEST     2014 ETT neg, 2017 nuclear neg   COLONOSCOPY     CORONARY ANGIOPLASTY     DES LAD 10/28/08 (Dr. Dann, [facility])   ELBOW SURGERY Left 07/09/2018   Dr. Shari   FOOT SURGERY     HAND SURGERY Bilateral 07/09/2018   Dr. Shari    knee surgery     SHOULDER ARTHROSCOPY Left 01/19/2022   Procedure: Left shoulder arthroscopy, manipulation under anesthesia, evaluation under anesthesia, debridement;  Surgeon: Duwayne Purchase, MD;  Location: [facility] ORS;  Service: Orthopedics;  Laterality: Left;  Regional block   SHOULDER ARTHROSCOPY WITH ROTATOR CUFF REPAIR AND SUBACROMIAL DECOMPRESSION Left 04/16/2020   Procedure:  SHOULDER ARTHROSCOPY WITH MINI OPEN ROTATOR CUFF REPAIR AND SUBACROMIAL DECOMPRESSION;  Surgeon: Duwayne Purchase, MD;  Location: [facility] ORS;  Service: Orthopedics;  Laterality: Left;  90 MINS   SHOULDER SURGERY Right 03/05/2018   Dr. Kay.    THYROID  LOBECTOMY Right 11/19/2018   Procedure: RIGHT THYROID  LOBECTOMY;  Surgeon: Rubin Calamity, MD;  Location: [facility] OR;  Service: General;  Laterality: Right;   TRANSTHORACIC ECHOCARDIOGRAM     07/2022 nl LV/RV and valves   UMBILICAL HERNIA REPAIR N/A 04/26/2024   Procedure: REPAIR, HERNIA, UMBILICAL, ADULT;  Surgeon: Eletha Boas, MD;  Location: [facility] ORS;  Service: General;  Laterality: N/A;  OPEN REPAIR  UMBILICAL HERNIA WITH MESH PATCH   US  CAROTID DOPPLER BILATERAL ([facility] HX)     01/2024 no stenosis. Rpt 2 yrs.    Family History  Problem Relation Age of Onset   Atrial fibrillation Father    Diabetes Father    Heart attack Father    Kidney disease Father    Coronary artery disease Maternal Grandfather    Diabetes Paternal Grandmother    CVA Paternal Grandfather    Stroke Paternal Grandfather     Social History   Socioeconomic History   Marital status: Married    Spouse name: Not on file   Number of children: Not on file   Years of education: Not on file   Highest education level: Some college, no degree  Occupational History   Not on file  Tobacco Use   Smoking status: Never    Passive exposure: Never   Smokeless tobacco: Never  Vaping Use   Vaping status: Never Used  Substance and Sexual Activity   Alcohol use: Yes    Comment: rare   Drug use: Never   Sexual activity: Yes    Partners: Female    Comment: married  Other Topics Concern   Not on file  Social History Narrative   Married,   Education: Some college   Occupation: Retired copywriter, advertising   No tobacco   Social Drivers of Corporate Investment Banker Strain: Low Risk  (05/16/2024)   Overall Financial Resource Strain (CARDIA)    Difficulty of Paying Living Expenses: Not hard at all  Food Insecurity: No Food Insecurity (05/16/2024)   Hunger Vital Sign    Worried About Running Out of Food in the Last Year: Never true    Ran Out of Food in the Last Year: Never true  Transportation Needs: No Transportation Needs (05/16/2024)   PRAPARE - Administrator, Civil Service (Medical): No    Lack of Transportation (Non-Medical): No  Physical Activity: Insufficiently Active (05/16/2024)   Exercise Vital Sign    Days of Exercise per Week: 4 days    Minutes of Exercise per Session: 30 min  Stress: No Stress Concern Present (05/16/2024)   [employer] of Occupational Health - Occupational Stress Questionnaire     Feeling of Stress: Only a little  Social Connections: Moderately Integrated (05/16/2024)   Social Connection and Isolation Panel    Frequency of Communication with Friends and Family: More than three times a week    Frequency of Social Gatherings with Friends and Family: Twice a week    Attends Religious Services: More than 4 times per year    Active Member of [employer] or Organizations: No    Attends Banker Meetings: Not on file    Marital Status: Married  Intimate Partner Violence: Not At Risk (07/17/2023)   Received from [facility]   HITS    Over the last 12 months how often did your partner physically hurt you?: Never    Over the last 12 months how often did your partner insult you or talk down to you?: Never    Over the last 12 months how often did your partner threaten you with physical harm?: Never    Over the last 12 months how often did your partner scream or curse at you?: Never    Outpatient Medications Prior to Visit  Medication Sig Dispense Refill   aspirin  EC 81 MG tablet Take 1 tablet (81 mg total) by mouth daily. Day after surgery 30 tablet 1   BD PEN NEEDLE NANO 2ND GEN 32G X 4 MM MISC USE ONCE DAILY AS INSTRUCTED     clopidogrel  (PLAVIX ) 75 MG tablet TAKE 1 TABLET BY MOUTH EVERY DAY WITH BREAKFAST 90 tablet 3   dapagliflozin  propanediol (FARXIGA ) 10 MG TABS tablet Take 1 tablet (10 mg total) by mouth daily. 90 tablet 3   lisinopril  (ZESTRIL ) 10 MG tablet Take 1 tablet (10 mg total) by mouth daily. 90 tablet 3   metFORMIN  (GLUCOPHAGE -XR) 500 MG 24 hr tablet 2 tabs p.o. twice daily 360 tablet 3   nitroGLYCERIN  (NITROSTAT ) 0.4 MG SL tablet Place 1 tablet (0.4 mg total) under the tongue every 5 (five) minutes as needed for chest pain. 25 tablet 6   Omega-3 Fatty Acids (FISH OIL) 1000 MG CAPS Take 1,000 mg by mouth daily.     rosuvastatin  (CRESTOR ) 20 MG tablet Take 1 tablet (20 mg total) by mouth daily. 90 tablet 3   TRESIBA  FLEXTOUCH 100 UNIT/ML FlexTouch  Pen ADMINISTER 50 UNITS UNDER THE SKIN DAILY 15 mL 1   traMADol  (ULTRAM ) 50 MG tablet Take 1-2 tablets (50-100 mg total) by mouth every 6 (six) hours as needed for moderate pain (pain score 4-6). (Patient not taking: Reported on 09/12/2024) 15 tablet 0   tirzepatide  (MOUNJARO ) 5 MG/0.5ML Pen Inject 5 mg into the skin once a week. (Patient not taking: Reported on 09/12/2024) 2 mL 0   No facility-administered medications prior to visit.    No Known Allergies  Review of Systems  Constitutional:  Negative for appetite change, chills, fatigue and fever.  HENT:  Negative for congestion, dental problem, ear pain and sore throat.   Eyes:  Negative for discharge, redness and visual disturbance.  Respiratory:  Negative for cough, chest tightness, shortness of breath and wheezing.   Cardiovascular:  Negative for chest pain, palpitations and leg swelling.  Gastrointestinal:  Negative for abdominal pain, blood in stool, diarrhea, nausea and vomiting.  Genitourinary:  Negative for difficulty urinating, dysuria, flank pain, frequency, hematuria and urgency.  Musculoskeletal:  Positive for arthralgias (R wrist/thumb as per HPI). Negative for back pain, joint swelling, myalgias and neck stiffness.  Skin:  Negative for pallor and rash.  Neurological:  Negative for dizziness, speech difficulty, weakness and headaches.  Hematological:  Negative for adenopathy. Does not bruise/bleed easily.  Psychiatric/Behavioral:  Negative for confusion and sleep disturbance. The patient is not nervous/anxious.     PE;    09/12/2024    8:58 AM 05/16/2024    2:07 PM 05/16/2024    1:57 PM  Vitals with BMI  Height 5' 10.75    Weight 241 lbs  240 lbs 10 oz  BMI 33.85  34.02  Systolic 131 138 850  Diastolic 80 90 88  Pulse 60  66   Gen: Alert, well appearing.  Patient  is oriented to person, place, time, and situation. AFFECT: pleasant, lucid thought and speech. ENT: Ears: EACs clear, normal epithelium.  TMs with good  light reflex and landmarks bilaterally.  Eyes: no injection, icteris, swelling, or exudate.  EOMI, PERRLA. Nose: no drainage or turbinate edema/swelling.  No injection or focal lesion.  Mouth: lips without lesion/swelling.  Oral mucosa pink and moist.  Dentition intact and without obvious caries or gingival swelling.  Oropharynx without erythema, exudate, or swelling.  Neck: supple/nontender.  No LAD, mass, or TM.  Carotid pulses 2+ bilaterally, without bruits. CV: RRR, no m/r/g.   LUNGS: CTA bilat, nonlabored resps, good aeration in all lung fields. ABD: soft, NT, ND, BS normal.  No hepatospenomegaly or mass.  No bruits. EXT: no clubbing, cyanosis, or edema.  Musculoskeletal: He has slight focal swelling and significant tenderness to palpation over the first extensor wrist compartment of the right wrist.  Pain with thumb abduction and extension.  No crepitus.  Sensation and strength normal.  Radial pulse normal.  Skin - no sores or suspicious lesions or rashes or color changes Foot exam - no swelling, tenderness or skin or vascular lesions. Color and temperature is normal. Sensation is intact. Peripheral pulses are palpable. Toenails are normal.  Pertinent labs:   Lab Results  Component Value Date   WBC 7.3 09/01/2023   HGB 13.4 09/01/2023   HCT 42.0 09/01/2023   MCV 92.6 09/01/2023   PLT 252.0 09/01/2023   Lab Results  Component Value Date   CREATININE 0.89 04/23/2024   BUN 19 04/23/2024   NA 141 04/23/2024   K 4.3 04/23/2024   CL 107 04/23/2024   CO2 24 04/23/2024   Lab Results  Component Value Date   ALT 22 09/01/2023   AST 17 09/01/2023   ALKPHOS 94 09/01/2023   BILITOT 0.7 09/01/2023   Lab Results  Component Value Date   CHOL 99 02/15/2024   Lab Results  Component Value Date   HDL 26 (L) 02/15/2024   Lab Results  Component Value Date   LDLCALC 47 02/15/2024   Lab Results  Component Value Date   TRIG 190 (H) 02/15/2024   Lab Results  Component Value Date    CHOLHDL 3.8 02/15/2024   Lab Results  Component Value Date   PSA 1.51 09/01/2023   Lab Results  Component Value Date   HGBA1C 7.6 (A) 09/12/2024   HGBA1C 7.6 09/12/2024   HGBA1C 7.6 (A) 09/12/2024   HGBA1C 7.6 (A) 09/12/2024   ASSESSMENT AND PLAN:   #1 health maintenance exam: Reviewed age and gender appropriate health maintenance issues (prudent diet, regular exercise, health risks of tobacco and excessive alcohol, use of seatbelts, fire alarms in home, use of sunscreen).  Also reviewed age and gender appropriate health screening as well as vaccine recommendations. Vaccines: Flu, shingrix, and rsv UTD 07/22/24. Labs: fasting HP Prostate ca screening: PSA today Colon ca screening: UTD, most recent colonoscopy 05/2022 at [facility], normal per pt-->recall 10 yrs.  #2 diabetes without complication, if control is improving. POC Hba1c today 7.6%. He prefers not to change his regimen of insulin  (Tresiba  50 units a day).  He will get back on Mounjaro  at the 2.5 mg weekly dose.  We will continue metformin  XR 1000 mg twice daily and Farxiga  10 mg a day. Urine microalbumin/creatinine today. Feet exam normal today.  3 hypertension, well-controlled on lisinopril  10 mg a day. Lytes/cr today.   3.  Hypercholesterolemia.  Doing well on Crestor  20  mg a day. LDL was 47 approximately 6 months ago. Lipid panel and hepatic panel today.   #4 de Quervain's tenosynovitis, right wrist. Steroid injection about 7 months ago only helped for a few days. Encouraged him to wear his spica splint more and we will get him to sports medicine.  An After Visit Summary was printed and given to the patient.  FOLLOW UP:  No follow-ups on file.  Signed:  Gerlene Hockey, MD           09/12/2024

## 2024-09-15 ENCOUNTER — Ambulatory Visit: Payer: Self-pay | Admitting: Family Medicine

## 2024-09-19 NOTE — Progress Notes (Signed)
 Ben Cylus [place] D.[facility] [address] [place] [address] Phone: [phone]   Assessment and Plan:     1. De Quervain's tenosynovitis, right (Primary) -Chronic with exacerbation, initial sports medicine visit - Most consistent with right DeQuervain's Tenosynovitis likely due to physical activity, repetitive motion on computer - Patient has had DeQuervain's Tenosynovitis CSI in the past with only mild relief - Recommend starting HEP for DeQuervain's Tenosynovitis - Recommend using thumb spica brace to decrease strain over first digit tendons when physically active and at night - Start meloxicam  15 mg daily x2 weeks.  If still having pain after 2 weeks, complete 3rd-week of NSAID. May use remaining NSAID as needed once daily for pain control.  Do not to use additional over-the-counter NSAIDs (ibuprofen , naproxen, Advil , Aleve, etc.) while taking prescription NSAIDs.  May use Tylenol  [phone] mg 2 to 3 times a day for breakthrough pain.  Will not plan on long-term NSAID use with concurrent use of Plavix   15 additional minutes spent for educating Therapeutic Home Exercise Program.  This included exercises focusing on stretching, strengthening, with focus on eccentric aspects.   Long term goals include an improvement in range of motion, strength, endurance as well as avoiding reinjury. Patient's frequency would include in 1-2 times a day, 3-5 times a week for a duration of 6-12 weeks. Proper technique shown and discussed handout in great detail with ATC.  All questions were discussed and answered.      Pertinent previous records reviewed include none   Follow Up: As needed if no improvement in 6 to 8 weeks.  Could consider ultrasound versus physical therapy versus prednisone course versus CSI   Subjective:   I, Moenique Parris, am serving as a neurosurgeon for Doctor Morene Mace  Chief Complaint: right thumb pain   HPI:   09/23/2024 Patient is a  63 year old male with right thumb pain. Patient states pain started 3-4 months ago. Has had CSI in the past and those helped very little. Pain radiates down the thumb and up the arm. Decreased grip strength. No meds for the pain. A little numbness. Decreased ROM. Pain is constant.    Relevant Historical Information: Hypertension, DM type II  Additional pertinent review of systems negative.   Current Outpatient Medications:    meloxicam  (MOBIC ) 15 MG tablet, Take 1 tablet (15 mg total) by mouth daily., Disp: 30 tablet, Rfl: 0   aspirin  EC 81 MG tablet, Take 1 tablet (81 mg total) by mouth daily. Day after surgery, Disp: 30 tablet, Rfl: 1   BD PEN NEEDLE NANO 2ND GEN 32G X 4 MM MISC, USE ONCE DAILY AS INSTRUCTED, Disp: , Rfl:    clopidogrel  (PLAVIX ) 75 MG tablet, TAKE 1 TABLET BY MOUTH EVERY DAY WITH BREAKFAST, Disp: 90 tablet, Rfl: 3   dapagliflozin  propanediol (FARXIGA ) 10 MG TABS tablet, Take 1 tablet (10 mg total) by mouth daily., Disp: 90 tablet, Rfl: 3   lisinopril  (ZESTRIL ) 10 MG tablet, Take 1 tablet (10 mg total) by mouth daily., Disp: 90 tablet, Rfl: 3   metFORMIN  (GLUCOPHAGE -XR) 500 MG 24 hr tablet, 2 tabs p.o. twice daily, Disp: 360 tablet, Rfl: 3   nitroGLYCERIN  (NITROSTAT ) 0.4 MG SL tablet, Place 1 tablet (0.4 mg total) under the tongue every 5 (five) minutes as needed for chest pain., Disp: 25 tablet, Rfl: 6   Omega-3 Fatty Acids (FISH OIL) 1000 MG CAPS, Take 1,000 mg by mouth daily., Disp: , Rfl:  rosuvastatin  (CRESTOR ) 20 MG tablet, Take 1 tablet (20 mg total) by mouth daily., Disp: 90 tablet, Rfl: 3   tirzepatide  (MOUNJARO ) 2.5 MG/0.5ML Pen, Inject 2.5 mg into the skin once a week., Disp: 2 mL, Rfl: 5   TRESIBA  FLEXTOUCH 100 UNIT/ML FlexTouch Pen, ADMINISTER 50 UNITS UNDER THE SKIN DAILY, Disp: 15 mL, Rfl: 1   Objective:     Vitals:   09/23/24 0917  Pulse: 69  SpO2: 98%  Weight: 244 lb (110.7 kg)  Height: 5' 10 (1.778 m)      Body mass index is 35.01 kg/m.     Physical Exam:    General: Appears well, nad, nontoxic and pleasant Neuro:sensation intact, strength is 5/5 in upper extremities, muscle tone wnl Skin:no susupicious lesions or rashes  Right hand/Wrist:   No deformity or swelling appreciated. ROM  Ext 90, flexion 70, radial/ulnar deviation 30 TTP radial styloid nttp over the snuff box, dorsal carpals, volar carpals,   ulnar styloid, 1st mcp, tfcc Negative Tinel's, Phalen's, Prayer Tests Positive finklestein Neg tfcc bounce test No pain with resisted ext, flex.  Pain with resisted deviation   Electronically signed by:  Odis Mace [facility] 9:36 AM 09/23/24

## 2024-09-23 ENCOUNTER — Ambulatory Visit: Admitting: Sports Medicine

## 2024-09-23 VITALS — HR 69 | Ht 70.0 in | Wt 244.0 lb

## 2024-09-23 DIAGNOSIS — M654 Radial styloid tenosynovitis [de Quervain]: Secondary | ICD-10-CM | POA: Diagnosis not present

## 2024-09-23 MED ORDER — MELOXICAM 15 MG PO TABS: 15.0000 mg | ORAL_TABLET | Freq: Every day | ORAL | 0 refills | Status: AC

## 2024-09-23 NOTE — Patient Instructions (Signed)
-   Start meloxicam  15 mg daily x2 weeks.  If still having pain after 2 weeks, complete 3rd-week of NSAID. May use remaining NSAID as needed once daily for pain control.  Do not to use additional over-the-counter NSAIDs (ibuprofen , naproxen, Advil , Aleve, etc.) while taking prescription NSAIDs.  May use Tylenol  [phone] mg 2 to 3 times a day for breakthrough pain. HEP Recommend thumb spica brace to wear with activity and at night Topical voltaren over areas of pain See me as needed but if no improvement see me in 6-8 weeks

## 2024-09-27 ENCOUNTER — Other Ambulatory Visit: Payer: Self-pay | Admitting: Family Medicine

## 2024-10-22 ENCOUNTER — Other Ambulatory Visit: Payer: Self-pay | Admitting: Sports Medicine

## 2024-10-28 LAB — HM DIABETES EYE EXAM

## 2024-11-08 ENCOUNTER — Ambulatory Visit: Payer: Self-pay

## 2024-11-08 NOTE — Telephone Encounter (Signed)
 Spoke with pt, offered appt for Tuesday; pt agreed and scheduled

## 2024-11-08 NOTE — Progress Notes (Signed)
 PATIENT: Eddie Garner MRN:  [id number] DOB:  08-18-1961 DATE OF SERVICE:  11/08/2024  Referring Physician:  Willian Eddie Sharper, MD Primary Physician:  Eddie CHRISTELLA Willian, MD  Chief Complaint:  Chief Complaint  Patient presents with   Right Knee - Pain    SUBJECTIVE:  Eddie Garner is a pleasant 64 y.o. who presents for evaluation of right radiating leg and posterior knee pain.  He states that he cannot recall any mechanism of injury as it just darted causing him pain yesterday but his wife states that he had a fall a week ago.  States he has been having pain behind the knee as well as radiation from his hip down to his ankle.  He has been taking recently 800 mg of Tylenol , Aleve, tramadol  and using Voltaren gel without much relief.  He states the elevation of his leg does help and is currently sitting in the chair with his leg elevated up on the exam table.  He is wondering what might be done for his leg.  Past Medical History:  Diagnosis Date   Acute CVA (cerebrovascular accident) (*) 07/15/2022   Coronary artery disease    Diabetes mellitus (*)    Hyperlipidemia    Hypertension    Type 2 diabetes mellitus with microalbuminuria, with long-term current use of insulin  (*) 02/17/2022   Past Surgical History:  Procedure Laterality Date   Cervical fusion  04/2021   Left elbow  07/2018   Left hand  07/2018   Right hemithyroidectomy Right 10/2018   no cancer   Right shoulder  02/2018   Shoulder surgery Left 04/17/2020   [facility]   Thyroid  lobectomy Right 11/19/2018   Trigger finger release     Patient has no known allergies. Family History[1] Social History   Social History Narrative   Not on file    Review of Systems  Musculoskeletal:  Positive for joint pain and myalgias.  All other systems reviewed and are negative.   OBJECTIVE:   VITALS:  There were no vitals taken for this visit.  PHYSICAL EXAMINATION: General Exam: patient appears well  nourished, and to be of stated age. No data recorded Neuro: alert and oriented, answers questions appropriately. Psychiatric: Normal mood and affect, in no acute distress. HEENT: normocephalic, atraumatic, pupils symmetric Skin: warm and dry, non diaphoretic.  Cardiovascular: Peripheral pulses symmetrical. Pulmonary: resp even an unlabored Extremities within normal limits except as discussed below  right Knee:  Patient walks with an antalgic gait pattern favoring the right leg.   Patient is not using any device for ambulation assistance. mild swelling  no erythema Tenderness to palpation at the following sites:  Along the medial and lateral tendons of the hamstrings posteriorly able  to preform a straight leg raise this does cause some pain radiates down the leg Active knee range of motion: 0-110 Stable with varus/valgus stress with mild discomfort mild audible and palpable crepitus with patellar tracking Neurovascularly intact distally Pulses present distally   IMAGING: 3 views of the patient's right knee were taken and demonstrate mild medial and patellofemoral arthritic changes otherwise no acute fracture or dislocation. 2 views of the patient's lumbar spine were taken and demonstrate appreciated lumbar degenerative changes through L3 and L4 as well as L4 and L5 including lumbar facet arthropathy. ASSESSMENT:   1. Sciatica of right side   2. Pain   3. Hamstring muscle strain, right, initial encounter   4. Fall, initial encounter     PLAN:  Reviewed  findings from exam and x-ray with patient concerning for sciatica as well as hamstrings tendinitis.  We discussed the possibility of an injection versus oral steroids.  As the patient is having more problems radiating through the leg as well as with palpation of the hamstrings tendon I do not feel that an injection would be beneficial.  We will trial him on a Medrol  Dosepak and see if this helps to alleviate the pain.  If not I  would recommend referral to spine for his back and physical therapy for his legs.  Follow-up as needed.  All questions were answered. The patient expressed understanding and agreement with the above outlined treatment plan.      This note was transcribed with voice recognition software.  Please excuse transcription errors.  Author:  Adine GORMAN [place], PA-C 11/08/2024 3:34 PM  CC: Eddie CHRISTELLA Amor, MD         [1] Family History Problem Relation Name Age of Onset   Cancer Brother  32        prostate   Diabetes Father     Heart disease Father     Heart failure Father     No Known Problems Son      Eddie Garner

## 2024-11-08 NOTE — Telephone Encounter (Signed)
 No appointments available for this pt until Tuesday. The pt was encouraged to go to [facility]; the pt refused. Please let the patient know if PCP availability opens today.   FYI Only or Action Required?: Action required by provider: request for appointment.  Patient was last seen in primary care on 09/12/2024 by McGowen, Aleene DEL, MD.  Called Nurse Triage reporting Knee Pain.  Symptoms began yesterday.  Interventions attempted: OTC medications: ibuprofen  and Prescription medications: tramadol .  Symptoms are: gradually worsening.  Triage Disposition: See PCP When Office is Open (Within 3 Days)  Patient/caregiver understands and will follow disposition?: Yes  Copied from CRM #[id number]. Topic: Clinical - Red Word Triage >> Nov 08, 2024  1:31 PM Deleta RAMAN wrote: Red Word that prompted transfer to Nurse Triage: knee pain level 9 Reason for Disposition  [1] MODERATE pain (e.g., interferes with normal activities, limping) AND [2] present > 3 days    Present since yesterday  Answer Assessment - Initial Assessment Questions 1. LOCATION and RADIATION: Where is the pain located?      Behind the R knee, radiates from ankle to back at times  2. QUALITY: What does the pain feel like?  (e.g., sharp, dull, aching, burning)     Achy  3. SEVERITY: How bad is the pain? What does it keep you from doing?   (Scale 1-10; or mild, moderate, severe)     9/10  4. ONSET: When did the pain start? Does it come and go, or is it there all the time?     Yesterday at work   6. SETTING: Has there been any recent work, exercise or other activity that involved that part of the body?      Slipped on something a few weeks ago, pt unsure if this is the cause  7. AGGRAVATING FACTORS: What makes the knee pain worse? (e.g., walking, climbing stairs, running) Denies  Took 800mg  ibuprofen , leftover tramadol  a few hours ago, helped the pain a little  Protocols used: Knee Pain-A-AH

## 2024-11-12 ENCOUNTER — Ambulatory Visit: Admitting: Family Medicine

## 2024-11-22 ENCOUNTER — Other Ambulatory Visit: Payer: Self-pay | Admitting: Family Medicine

## 2025-01-10 ENCOUNTER — Ambulatory Visit: Admitting: Cardiovascular Disease

## 2025-03-12 ENCOUNTER — Ambulatory Visit: Admitting: Family Medicine
